# Patient Record
Sex: Male | Born: 1965 | Race: Black or African American | Hispanic: No | Marital: Single | Smoking: Current every day smoker
Health system: Southern US, Community
[De-identification: ages and names within clinical notes are randomized; demographics above are authoritative.]

## PROBLEM LIST (undated history)

## (undated) DIAGNOSIS — J45909 Unspecified asthma, uncomplicated: Secondary | ICD-10-CM

## (undated) DIAGNOSIS — N400 Enlarged prostate without lower urinary tract symptoms: Secondary | ICD-10-CM

## (undated) DIAGNOSIS — Z21 Asymptomatic human immunodeficiency virus [HIV] infection status: Secondary | ICD-10-CM

## (undated) DIAGNOSIS — I10 Essential (primary) hypertension: Secondary | ICD-10-CM

## (undated) DIAGNOSIS — S42309A Unspecified fracture of shaft of humerus, unspecified arm, initial encounter for closed fracture: Secondary | ICD-10-CM

## (undated) DIAGNOSIS — B20 Human immunodeficiency virus [HIV] disease: Secondary | ICD-10-CM

## (undated) DIAGNOSIS — F209 Schizophrenia, unspecified: Secondary | ICD-10-CM

## (undated) DIAGNOSIS — L309 Dermatitis, unspecified: Secondary | ICD-10-CM

## (undated) DIAGNOSIS — G4730 Sleep apnea, unspecified: Secondary | ICD-10-CM

## (undated) DIAGNOSIS — K4090 Unilateral inguinal hernia, without obstruction or gangrene, not specified as recurrent: Secondary | ICD-10-CM

## (undated) HISTORY — PX: MULTIPLE TOOTH EXTRACTIONS: SHX2053

---

## 2018-08-23 ENCOUNTER — Encounter (HOSPITAL_COMMUNITY): Payer: Self-pay | Admitting: Emergency Medicine

## 2018-08-23 ENCOUNTER — Ambulatory Visit (HOSPITAL_COMMUNITY)
Admission: EM | Admit: 2018-08-23 | Discharge: 2018-08-23 | Disposition: A | Discharge status: Home / Self Care | Payer: Self-pay | Attending: Family Medicine | Admitting: Family Medicine

## 2018-08-23 DIAGNOSIS — Z23 Encounter for immunization: Secondary | ICD-10-CM

## 2018-08-23 DIAGNOSIS — T22252A Burn of second degree of left shoulder, initial encounter: Secondary | ICD-10-CM

## 2018-08-23 DIAGNOSIS — L2084 Intrinsic (allergic) eczema: Secondary | ICD-10-CM

## 2018-08-23 MED ORDER — METHYLPREDNISOLONE ACETATE 80 MG/ML IJ SUSP
80.0000 mg | Freq: Once | INTRAMUSCULAR | Status: AC
  Administered 2018-08-23: 80 mg via INTRAMUSCULAR

## 2018-08-23 MED ORDER — SILVER SULFADIAZINE 1 % EX CREA: 1.0000 "application " | TOPICAL_CREAM | Freq: Every day | CUTANEOUS | 1 refills | Status: DC

## 2018-08-23 MED ORDER — METHYLPREDNISOLONE ACETATE 80 MG/ML IJ SUSP
INTRAMUSCULAR | Status: AC
  Filled 2018-08-23: qty 1

## 2018-08-23 MED ORDER — DIPHENHYDRAMINE HCL 25 MG PO CAPS
ORAL_CAPSULE | ORAL | Status: AC
  Filled 2018-08-23: qty 1

## 2018-08-23 MED ORDER — TETANUS-DIPHTH-ACELL PERTUSSIS 5-2.5-18.5 LF-MCG/0.5 IM SUSP
INTRAMUSCULAR | Status: AC
  Filled 2018-08-23: qty 0.5

## 2018-08-23 MED ORDER — TETANUS-DIPHTH-ACELL PERTUSSIS 5-2.5-18.5 LF-MCG/0.5 IM SUSP
0.5000 mL | Freq: Once | INTRAMUSCULAR | Status: AC
  Administered 2018-08-23: 0.5 mL via INTRAMUSCULAR

## 2018-08-23 MED ORDER — DIPHENHYDRAMINE HCL 25 MG PO CAPS
25.0000 mg | ORAL_CAPSULE | Freq: Once | ORAL | Status: AC
  Administered 2018-08-23: 25 mg via ORAL
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
## 2018-08-23 NOTE — Discharge Instructions (Addendum)
Apply the burn cream after gently washing the wound with soap and water daily.  This wound will take 2 to 3 weeks to heal.

## 2019-02-27 ENCOUNTER — Emergency Department (HOSPITAL_COMMUNITY): Payer: Self-pay

## 2019-02-27 ENCOUNTER — Encounter (HOSPITAL_COMMUNITY): Payer: Self-pay

## 2019-02-27 ENCOUNTER — Other Ambulatory Visit: Payer: Self-pay

## 2019-02-27 ENCOUNTER — Emergency Department (HOSPITAL_COMMUNITY)
Admission: EM | Admit: 2019-02-27 | Discharge: 2019-02-27 | Disposition: A | Discharge status: Home / Self Care | Payer: Self-pay | Attending: Emergency Medicine | Admitting: Emergency Medicine

## 2019-02-27 DIAGNOSIS — Y9389 Activity, other specified: Secondary | ICD-10-CM | POA: Insufficient documentation

## 2019-02-27 DIAGNOSIS — Z21 Asymptomatic human immunodeficiency virus [HIV] infection status: Secondary | ICD-10-CM | POA: Insufficient documentation

## 2019-02-27 DIAGNOSIS — Y92481 Parking lot as the place of occurrence of the external cause: Secondary | ICD-10-CM | POA: Insufficient documentation

## 2019-02-27 DIAGNOSIS — W3400XA Accidental discharge from unspecified firearms or gun, initial encounter: Secondary | ICD-10-CM

## 2019-02-27 DIAGNOSIS — S41132A Puncture wound without foreign body of left upper arm, initial encounter: Secondary | ICD-10-CM

## 2019-02-27 DIAGNOSIS — S42352B Displaced comminuted fracture of shaft of humerus, left arm, initial encounter for open fracture: Secondary | ICD-10-CM | POA: Insufficient documentation

## 2019-02-27 DIAGNOSIS — Y998 Other external cause status: Secondary | ICD-10-CM | POA: Insufficient documentation

## 2019-02-27 HISTORY — DX: Human immunodeficiency virus (HIV) disease: B20

## 2019-02-27 HISTORY — DX: Asymptomatic human immunodeficiency virus (hiv) infection status: Z21

## 2019-02-27 LAB — CBC
HCT: 46 % (ref 39.0–52.0)
Hemoglobin: 15.3 g/dL (ref 13.0–17.0)
MCH: 30.1 pg (ref 26.0–34.0)
MCHC: 33.3 g/dL (ref 30.0–36.0)
MCV: 90.4 fL (ref 80.0–100.0)
Platelets: 183 10*3/uL (ref 150–400)
RBC: 5.09 MIL/uL (ref 4.22–5.81)
RDW: 13.2 % (ref 11.5–15.5)
WBC: 8.4 10*3/uL (ref 4.0–10.5)
nRBC: 0 % (ref 0.0–0.2)

## 2019-02-27 LAB — COMPREHENSIVE METABOLIC PANEL
ALT: 23 U/L (ref 0–44)
AST: 44 U/L — ABNORMAL HIGH (ref 15–41)
Albumin: 3.6 g/dL (ref 3.5–5.0)
Alkaline Phosphatase: 54 U/L (ref 38–126)
Anion gap: 11 (ref 5–15)
BUN: 8 mg/dL (ref 6–20)
CO2: 23 mmol/L (ref 22–32)
Calcium: 9.3 mg/dL (ref 8.9–10.3)
Chloride: 103 mmol/L (ref 98–111)
Creatinine, Ser: 1.16 mg/dL (ref 0.61–1.24)
GFR calc Af Amer: >60 mL/min (ref >60)
GFR calc non Af Amer: >60 mL/min (ref >60)
Glucose, Bld: 123 mg/dL — ABNORMAL HIGH (ref 70–99)
Potassium: 3.5 mmol/L (ref 3.5–5.1)
Sodium: 137 mmol/L (ref 135–145)
Total Bilirubin: 1.3 mg/dL — ABNORMAL HIGH (ref 0.3–1.2)
Total Protein: 7.9 g/dL (ref 6.5–8.1)

## 2019-02-27 LAB — SAMPLE TO BLOOD BANK

## 2019-02-27 LAB — ETHANOL: Alcohol, Ethyl (B): <10 mg/dL (ref <10)

## 2019-02-27 LAB — PROTIME-INR
INR: 1.1 (ref 0.8–1.2)
Prothrombin Time: 13.6 seconds (ref 11.4–15.2)

## 2019-02-27 LAB — LACTIC ACID, PLASMA: Lactic Acid, Venous: 3.5 mmol/L (ref 0.5–1.9)

## 2019-02-27 LAB — CDS SEROLOGY

## 2019-02-27 MED ORDER — OXYCODONE-ACETAMINOPHEN 5-325 MG PO TABS: 1.0000 | ORAL_TABLET | ORAL | 0 refills | Status: AC | PRN

## 2019-02-27 MED ORDER — CEPHALEXIN 500 MG PO CAPS: 500.0000 mg | ORAL_CAPSULE | Freq: Four times a day (QID) | ORAL | 0 refills | Status: AC

## 2019-02-27 MED ORDER — OXYCODONE-ACETAMINOPHEN 5-325 MG PO TABS
2.0000 | ORAL_TABLET | Freq: Once | ORAL | Status: AC
  Administered 2019-02-27: 2 via ORAL
  Filled 2019-02-27: qty 2

## 2019-02-27 MED ORDER — MORPHINE SULFATE (PF) 4 MG/ML IV SOLN
4.0000 mg | Freq: Once | INTRAVENOUS | Status: AC
  Administered 2019-02-27: 4 mg via INTRAVENOUS
  Filled 2019-02-27: qty 1

## 2019-02-27 MED ORDER — CEFAZOLIN SODIUM-DEXTROSE 2-4 GM/100ML-% IV SOLN
2.0000 g | Freq: Once | INTRAVENOUS | Status: AC
  Administered 2019-02-27: 20:00:00 2 g via INTRAVENOUS
  Filled 2019-02-27: qty 100

## 2019-02-27 MED ORDER — FENTANYL CITRATE (PF) 100 MCG/2ML IJ SOLN
INTRAMUSCULAR | Status: AC | PRN
  Administered 2019-02-27: 100 ug via INTRAVENOUS

## 2019-02-27 MED ORDER — FENTANYL CITRATE (PF) 100 MCG/2ML IJ SOLN
100.0000 ug | Freq: Once | INTRAMUSCULAR | Status: AC
  Administered 2019-02-27: 100 ug via INTRAVENOUS
  Filled 2019-02-27: qty 2

## 2019-02-27 NOTE — ED Notes (Signed)
Marcus Nichols [phone]  Came to visit. Would like to call the pt later.
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
## 2019-02-28 ENCOUNTER — Encounter (HOSPITAL_COMMUNITY): Payer: Self-pay | Admitting: *Deleted

## 2019-02-28 ENCOUNTER — Ambulatory Visit: Payer: Self-pay | Admitting: Student

## 2019-02-28 ENCOUNTER — Encounter (HOSPITAL_COMMUNITY): Payer: Self-pay | Admitting: Emergency Medicine

## 2019-02-28 DIAGNOSIS — S42352B Displaced comminuted fracture of shaft of humerus, left arm, initial encounter for open fracture: Secondary | ICD-10-CM | POA: Insufficient documentation

## 2019-02-28 NOTE — Progress Notes (Signed)
Unable to reach patient after several attempts to discuss procedure details and instruct patient to arrive at [facility] 2 hours prior to scheduled surgery tomorrow (03/01/2019). Unable to leave voicemail for patient due to mailbox being full. Several unsuccessful attempts to reach brother, Mellody Dance, as well. Will make one additional attempt this evening to both the patient and his brother.   Carsin Randazzo A. Ladonna Snide [facility] ?[phone]? (phone)

## 2019-02-28 NOTE — Progress Notes (Signed)
Unable to reach patient after several attempts. LMOM with pre-op instructions for DOS. Informed to stop all vitamin, supplements, Ibuprofen/NSAIDS, Goody's, BC Powder.  May take percocet if needed.  Informed of hospital visitor restriction policy that is now in place.  Will need to complete all medical/surgical history on DOS.  Patient informed if he has any symptoms of covid-10 - fever, cough, SOB/difficulty breathing, muscle aches, n/v to call [phone] prior to arrival to hospital on DOS.

## 2019-03-01 ENCOUNTER — Encounter (HOSPITAL_COMMUNITY): Payer: Self-pay | Admitting: Anesthesiology

## 2019-03-01 ENCOUNTER — Ambulatory Visit (HOSPITAL_COMMUNITY): Admission: RE | Admit: 2019-03-01 | Payer: Self-pay | Source: Ambulatory Visit | Admitting: Student

## 2019-03-01 HISTORY — DX: Dermatitis, unspecified: L30.9

## 2019-03-01 SURGERY — OPEN REDUCTION INTERNAL FIXATION (ORIF) HUMERAL SHAFT FRACTURE
Anesthesia: General | Laterality: Left
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
## 2019-03-06 ENCOUNTER — Other Ambulatory Visit: Payer: Self-pay

## 2019-03-06 ENCOUNTER — Encounter (HOSPITAL_COMMUNITY): Payer: Self-pay | Admitting: *Deleted
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
## 2019-03-07 ENCOUNTER — Encounter (HOSPITAL_COMMUNITY)
Admission: RE | Disposition: A | Discharge status: Home / Self Care | Payer: Self-pay | Source: Home / Self Care | Attending: Student

## 2019-03-07 ENCOUNTER — Ambulatory Visit (HOSPITAL_COMMUNITY): Payer: Self-pay | Admitting: Anesthesiology

## 2019-03-07 ENCOUNTER — Ambulatory Visit (HOSPITAL_COMMUNITY)
Admission: RE | Admit: 2019-03-07 | Discharge: 2019-03-07 | Disposition: A | Discharge status: Home / Self Care | Payer: Self-pay | Attending: Student | Admitting: Student

## 2019-03-07 ENCOUNTER — Encounter (HOSPITAL_COMMUNITY): Payer: Self-pay

## 2019-03-07 ENCOUNTER — Ambulatory Visit (HOSPITAL_COMMUNITY): Payer: Self-pay

## 2019-03-07 DIAGNOSIS — F17200 Nicotine dependence, unspecified, uncomplicated: Secondary | ICD-10-CM | POA: Insufficient documentation

## 2019-03-07 DIAGNOSIS — Z21 Asymptomatic human immunodeficiency virus [HIV] infection status: Secondary | ICD-10-CM | POA: Insufficient documentation

## 2019-03-07 DIAGNOSIS — L309 Dermatitis, unspecified: Secondary | ICD-10-CM | POA: Insufficient documentation

## 2019-03-07 DIAGNOSIS — Z419 Encounter for procedure for purposes other than remedying health state, unspecified: Secondary | ICD-10-CM

## 2019-03-07 DIAGNOSIS — G4730 Sleep apnea, unspecified: Secondary | ICD-10-CM | POA: Insufficient documentation

## 2019-03-07 DIAGNOSIS — W458XXA Other foreign body or object entering through skin, initial encounter: Secondary | ICD-10-CM | POA: Insufficient documentation

## 2019-03-07 DIAGNOSIS — S42352A Displaced comminuted fracture of shaft of humerus, left arm, initial encounter for closed fracture: Secondary | ICD-10-CM | POA: Insufficient documentation

## 2019-03-07 DIAGNOSIS — W3400XA Accidental discharge from unspecified firearms or gun, initial encounter: Secondary | ICD-10-CM | POA: Insufficient documentation

## 2019-03-07 HISTORY — DX: Unspecified asthma, uncomplicated: J45.909

## 2019-03-07 HISTORY — PX: ORIF HUMERUS FRACTURE: SHX2126

## 2019-03-07 HISTORY — DX: Unspecified fracture of shaft of humerus, unspecified arm, initial encounter for closed fracture: S42.309A

## 2019-03-07 HISTORY — DX: Benign prostatic hyperplasia without lower urinary tract symptoms: N40.0

## 2019-03-07 HISTORY — DX: Sleep apnea, unspecified: G47.30

## 2019-03-07 HISTORY — DX: Unilateral inguinal hernia, without obstruction or gangrene, not specified as recurrent: K40.90

## 2019-03-07 HISTORY — DX: Schizophrenia, unspecified: F20.9

## 2019-03-07 SURGERY — OPEN REDUCTION INTERNAL FIXATION (ORIF) HUMERAL SHAFT FRACTURE
Anesthesia: General | Laterality: Left

## 2019-03-07 MED ORDER — OXYCODONE-ACETAMINOPHEN 5-325 MG PO TABS: 1.0000 | ORAL_TABLET | ORAL | 0 refills | Status: DC | PRN

## 2019-03-07 MED ORDER — BUPIVACAINE-EPINEPHRINE (PF) 0.25% -1:200000 IJ SOLN
INTRAMUSCULAR | Status: AC
  Filled 2019-03-07: qty 30

## 2019-03-07 MED ORDER — PHENYLEPHRINE 40 MCG/ML (10ML) SYRINGE FOR IV PUSH (FOR BLOOD PRESSURE SUPPORT)
PREFILLED_SYRINGE | INTRAVENOUS | Status: DC | PRN
  Administered 2019-03-07: 40 ug via INTRAVENOUS

## 2019-03-07 MED ORDER — LIDOCAINE-EPINEPHRINE (PF) 1.5 %-1:200000 IJ SOLN
INTRAMUSCULAR | Status: DC | PRN
  Administered 2019-03-07: 10 mL via PERINEURAL

## 2019-03-07 MED ORDER — ONDANSETRON HCL 4 MG/2ML IJ SOLN
INTRAMUSCULAR | Status: AC
  Filled 2019-03-07: qty 2

## 2019-03-07 MED ORDER — FENTANYL CITRATE (PF) 100 MCG/2ML IJ SOLN
INTRAMUSCULAR | Status: AC
  Administered 2019-03-07: 100 ug via INTRAVENOUS
  Filled 2019-03-07: qty 2

## 2019-03-07 MED ORDER — OXYCODONE HCL 5 MG PO TABS
ORAL_TABLET | ORAL | Status: AC
  Filled 2019-03-07: qty 1

## 2019-03-07 MED ORDER — VANCOMYCIN HCL 1000 MG IV SOLR
INTRAVENOUS | Status: DC | PRN
  Administered 2019-03-07: 1000 mg

## 2019-03-07 MED ORDER — FENTANYL CITRATE (PF) 100 MCG/2ML IJ SOLN
100.0000 ug | Freq: Once | INTRAMUSCULAR | Status: AC
  Administered 2019-03-07: 100 ug via INTRAVENOUS

## 2019-03-07 MED ORDER — HYDROMORPHONE HCL 1 MG/ML IJ SOLN: 0.2500 mg | INTRAMUSCULAR | Status: DC | PRN

## 2019-03-07 MED ORDER — PROPOFOL 10 MG/ML IV BOLUS
INTRAVENOUS | Status: DC | PRN
  Administered 2019-03-07: 200 mg via INTRAVENOUS
  Administered 2019-03-07: 30 mg via INTRAVENOUS

## 2019-03-07 MED ORDER — MIDAZOLAM HCL 2 MG/2ML IJ SOLN
2.0000 mg | Freq: Once | INTRAMUSCULAR | Status: AC
  Administered 2019-03-07: 2 mg via INTRAVENOUS

## 2019-03-07 MED ORDER — LACTATED RINGERS IV SOLN
INTRAVENOUS | Status: DC
  Administered 2019-03-07 (×2): via INTRAVENOUS

## 2019-03-07 MED ORDER — PROMETHAZINE HCL 25 MG/ML IJ SOLN: 6.2500 mg | INTRAMUSCULAR | Status: DC | PRN

## 2019-03-07 MED ORDER — OXYCODONE HCL 5 MG PO TABS
5.0000 mg | ORAL_TABLET | Freq: Once | ORAL | Status: AC | PRN
  Administered 2019-03-07: 5 mg via ORAL

## 2019-03-07 MED ORDER — CEFAZOLIN SODIUM-DEXTROSE 2-4 GM/100ML-% IV SOLN
2.0000 g | INTRAVENOUS | Status: AC
  Administered 2019-03-07: 2 g via INTRAVENOUS
  Filled 2019-03-07: qty 100

## 2019-03-07 MED ORDER — MIDAZOLAM HCL 2 MG/2ML IJ SOLN
INTRAMUSCULAR | Status: AC
  Filled 2019-03-07: qty 2

## 2019-03-07 MED ORDER — MIDAZOLAM HCL 2 MG/2ML IJ SOLN
INTRAMUSCULAR | Status: AC
  Administered 2019-03-07: 2 mg via INTRAVENOUS
  Filled 2019-03-07: qty 2

## 2019-03-07 MED ORDER — 0.9 % SODIUM CHLORIDE (POUR BTL) OPTIME
TOPICAL | Status: DC | PRN
  Administered 2019-03-07: 11:00:00 1000 mL

## 2019-03-07 MED ORDER — DEXAMETHASONE SODIUM PHOSPHATE 10 MG/ML IJ SOLN
INTRAMUSCULAR | Status: DC | PRN
  Administered 2019-03-07: 10 mg via INTRAVENOUS

## 2019-03-07 MED ORDER — DEXAMETHASONE SODIUM PHOSPHATE 10 MG/ML IJ SOLN
INTRAMUSCULAR | Status: AC
  Filled 2019-03-07: qty 1

## 2019-03-07 MED ORDER — PROPOFOL 10 MG/ML IV BOLUS
INTRAVENOUS | Status: AC
  Filled 2019-03-07: qty 20

## 2019-03-07 MED ORDER — BUPIVACAINE HCL (PF) 0.5 % IJ SOLN
INTRAMUSCULAR | Status: DC | PRN
  Administered 2019-03-07: 25 mL via PERINEURAL

## 2019-03-07 MED ORDER — ONDANSETRON HCL 4 MG/2ML IJ SOLN
INTRAMUSCULAR | Status: DC | PRN
  Administered 2019-03-07: 4 mg via INTRAVENOUS

## 2019-03-07 MED ORDER — FENTANYL CITRATE (PF) 250 MCG/5ML IJ SOLN
INTRAMUSCULAR | Status: AC
  Filled 2019-03-07: qty 5

## 2019-03-07 MED ORDER — DEXMEDETOMIDINE HCL 200 MCG/2ML IV SOLN
INTRAVENOUS | Status: DC | PRN
  Administered 2019-03-07: 4 ug via INTRAVENOUS
  Administered 2019-03-07: 8 ug via INTRAVENOUS
  Administered 2019-03-07: 4 ug via INTRAVENOUS
  Administered 2019-03-07: 8 ug via INTRAVENOUS
  Administered 2019-03-07: 4 ug via INTRAVENOUS

## 2019-03-07 MED ORDER — OXYCODONE HCL 5 MG/5ML PO SOLN: 5.0000 mg | Freq: Once | ORAL | Status: AC | PRN

## 2019-03-07 MED ORDER — FENTANYL CITRATE (PF) 250 MCG/5ML IJ SOLN
INTRAMUSCULAR | Status: DC | PRN
  Administered 2019-03-07 (×3): 50 ug via INTRAVENOUS

## 2019-03-07 MED ORDER — LIDOCAINE 2% (20 MG/ML) 5 ML SYRINGE
INTRAMUSCULAR | Status: DC | PRN
  Administered 2019-03-07: 100 mg via INTRAVENOUS

## 2019-03-07 MED ORDER — SUCCINYLCHOLINE CHLORIDE 200 MG/10ML IV SOSY
PREFILLED_SYRINGE | INTRAVENOUS | Status: DC | PRN
  Administered 2019-03-07: 120 mg via INTRAVENOUS

## 2019-03-07 MED ORDER — SUGAMMADEX SODIUM 200 MG/2ML IV SOLN
INTRAVENOUS | Status: DC | PRN
  Administered 2019-03-07: 250 mg via INTRAVENOUS

## 2019-03-07 MED ORDER — LIDOCAINE 2% (20 MG/ML) 5 ML SYRINGE
INTRAMUSCULAR | Status: AC
  Filled 2019-03-07: qty 5

## 2019-03-07 MED ORDER — VANCOMYCIN HCL 1000 MG IV SOLR
INTRAVENOUS | Status: AC
  Filled 2019-03-07: qty 1000

## 2019-03-07 MED ORDER — ROCURONIUM BROMIDE 10 MG/ML (PF) SYRINGE
PREFILLED_SYRINGE | INTRAVENOUS | Status: DC | PRN
  Administered 2019-03-07: 20 mg via INTRAVENOUS
  Administered 2019-03-07: 50 mg via INTRAVENOUS

## 2019-03-07 SURGICAL SUPPLY — 62 items
BANDAGE ACE 6X5 VEL STRL LF (GAUZE/BANDAGES/DRESSINGS) ×2 IMPLANT
BANDAGE ELASTIC 4 VELCRO ST LF (GAUZE/BANDAGES/DRESSINGS) ×2 IMPLANT
BANDAGE ELASTIC 6 VELCRO ST LF (GAUZE/BANDAGES/DRESSINGS) ×2 IMPLANT
BIT DRILL Q/COUPLING 1 (BIT) ×2 IMPLANT
BNDG COHESIVE 4X5 TAN STRL (GAUZE/BANDAGES/DRESSINGS) ×2 IMPLANT
BRUSH SCRUB SURG 4.25 DISP (MISCELLANEOUS) ×4 IMPLANT
CHLORAPREP W/TINT 26ML (MISCELLANEOUS) ×2 IMPLANT
COVER SURGICAL LIGHT HANDLE (MISCELLANEOUS) ×4 IMPLANT
COVER WAND RF STERILE (DRAPES) ×2 IMPLANT
DERMABOND ADVANCED (GAUZE/BANDAGES/DRESSINGS) ×2
DERMABOND ADVANCED .7 DNX12 (GAUZE/BANDAGES/DRESSINGS) ×2 IMPLANT
DRAPE C-ARM 42X72 X-RAY (DRAPES) ×2 IMPLANT
DRAPE INCISE IOBAN 66X45 STRL (DRAPES) ×2 IMPLANT
DRAPE ORTHO SPLIT 77X108 STRL (DRAPES) ×2
DRAPE SURG 17X23 STRL (DRAPES) ×2 IMPLANT
DRAPE SURG ORHT 6 SPLT 77X108 (DRAPES) ×2 IMPLANT
DRAPE U-SHAPE 47X51 STRL (DRAPES) ×4 IMPLANT
DRSG MEPILEX BORDER 4X12 (GAUZE/BANDAGES/DRESSINGS) ×2 IMPLANT
DRSG MEPILEX BORDER 4X8 (GAUZE/BANDAGES/DRESSINGS) ×2 IMPLANT
DRSG PAD ABDOMINAL 8X10 ST (GAUZE/BANDAGES/DRESSINGS) ×2 IMPLANT
ELECT REM PT RETURN 9FT ADLT (ELECTROSURGICAL) ×2
ELECTRODE REM PT RTRN 9FT ADLT (ELECTROSURGICAL) ×1 IMPLANT
EVACUATOR 1/8 PVC DRAIN (DRAIN) IMPLANT
GLOVE BIO SURGEON STRL SZ 6.5 (GLOVE) ×6 IMPLANT
GLOVE BIO SURGEON STRL SZ7.5 (GLOVE) ×8 IMPLANT
GLOVE BIOGEL PI IND STRL 6.5 (GLOVE) ×1 IMPLANT
GLOVE BIOGEL PI IND STRL 7.5 (GLOVE) ×1 IMPLANT
GLOVE BIOGEL PI INDICATOR 6.5 (GLOVE) ×1
GLOVE BIOGEL PI INDICATOR 7.5 (GLOVE) ×1
GOWN STRL REUS W/ TWL LRG LVL3 (GOWN DISPOSABLE) ×2 IMPLANT
GOWN STRL REUS W/TWL LRG LVL3 (GOWN DISPOSABLE) ×2
KIT BASIN OR (CUSTOM PROCEDURE TRAY) ×2 IMPLANT
KIT TURNOVER KIT B (KITS) ×2 IMPLANT
MANIFOLD NEPTUNE II (INSTRUMENTS) ×2 IMPLANT
NEEDLE HYPO 25X1 1.5 SAFETY (NEEDLE) ×2 IMPLANT
NS IRRIG 1000ML POUR BTL (IV SOLUTION) ×2 IMPLANT
PACK ORTHO EXTREMITY (CUSTOM PROCEDURE TRAY) ×2 IMPLANT
PAD ARMBOARD 7.5X6 YLW CONV (MISCELLANEOUS) ×4 IMPLANT
PAD CAST 4YDX4 CTTN HI CHSV (CAST SUPPLIES) ×1 IMPLANT
PADDING CAST COTTON 4X4 STRL (CAST SUPPLIES) ×1
PLATE LOCKING 11 HOLE (Plate) ×2 IMPLANT
SCREW CORTEX ST 4.5X28 (Screw) ×2 IMPLANT
SCREW CORTEX ST 4.5X30 (Screw) ×2 IMPLANT
SCREW CORTEX ST 4.5X32 (Screw) ×12 IMPLANT
SPONGE LAP 18X18 RF (DISPOSABLE) ×2 IMPLANT
STAPLER VISISTAT 35W (STAPLE) ×2 IMPLANT
SUCTION FRAZIER HANDLE 10FR (MISCELLANEOUS) ×1
SUCTION TUBE FRAZIER 10FR DISP (MISCELLANEOUS) ×1 IMPLANT
SUT ETHILON 3 0 PS 1 (SUTURE) ×4 IMPLANT
SUT MNCRL AB 3-0 PS2 18 (SUTURE) ×2 IMPLANT
SUT PROLENE 0 CT (SUTURE) IMPLANT
SUT VIC AB 0 CT1 27 (SUTURE) ×2
SUT VIC AB 0 CT1 27XBRD ANBCTR (SUTURE) ×2 IMPLANT
SUT VIC AB 2-0 CT1 27 (SUTURE) ×2
SUT VIC AB 2-0 CT1 TAPERPNT 27 (SUTURE) ×2 IMPLANT
SYR CONTROL 10ML LL (SYRINGE) ×2 IMPLANT
TOWEL OR 17X24 6PK STRL BLUE (TOWEL DISPOSABLE) ×2 IMPLANT
TOWEL OR 17X26 10 PK STRL BLUE (TOWEL DISPOSABLE) ×6 IMPLANT
TRAY FOLEY MTR SLVR 16FR STAT (SET/KITS/TRAYS/PACK) IMPLANT
TUBE CONNECTING 12X1/4 (SUCTIONS) ×2 IMPLANT
WATER STERILE IRR 1000ML POUR (IV SOLUTION) ×2 IMPLANT
YANKAUER SUCT BULB TIP NO VENT (SUCTIONS) ×4 IMPLANT

## 2019-03-07 NOTE — Interval H&P Note (Signed)
History and Physical Interval Note:  03/07/2019 10:30 AM  Marcus Nichols  has presented today for surgery, with the diagnosis of LEFT HUMERUS FRACTURE.  The various methods of treatment have been discussed with the patient and family. After consideration of risks, benefits and other options for treatment, the patient has consented to  Procedure(s): OPEN REDUCTION INTERNAL FIXATION (ORIF) HUMERAL SHAFT FRACTURE (Left) as a surgical intervention.  The patient's history has been reviewed, patient examined, no change in status, stable for surgery.  I have reviewed the patient's chart and labs.  Questions were answered to the patient's satisfaction.     Caryn Bee P Haddix

## 2019-03-07 NOTE — Anesthesia Procedure Notes (Signed)
Procedure Name: Intubation Date/Time: 03/07/2019 10:54 AM Performed by: Alvera Novel, CRNA Pre-anesthesia Checklist: Patient identified, Emergency Drugs available, Suction available and Patient being monitored Patient Re-evaluated:Patient Re-evaluated prior to induction Oxygen Delivery Method: Circle System Utilized Preoxygenation: Pre-oxygenation with 100% oxygen Induction Type: IV induction Ventilation: Mask ventilation without difficulty Laryngoscope Size: 4 and Glidescope Grade View: Grade I Tube type: Oral Tube size: 7.0 mm Number of attempts: 1 Airway Equipment and Method: Stylet and Oral airway Placement Confirmation: ETT inserted through vocal cords under direct vision,  positive ETCO2 and breath sounds checked- equal and bilateral Secured at: 21 cm Tube secured with: Tape Dental Injury: Teeth and Oropharynx as per pre-operative assessment  Comments: Elective glidescope intubation.

## 2019-03-07 NOTE — Anesthesia Postprocedure Evaluation (Signed)
Anesthesia Post Note  Patient: Jathniel Polinsky  Procedure(s) Performed: OPEN REDUCTION INTERNAL FIXATION (ORIF) HUMERAL SHAFT FRACTURE (Left )     Patient location during evaluation: PACU Anesthesia Type: General Level of consciousness: awake and alert Pain management: pain level controlled Vital Signs Assessment: post-procedure vital signs reviewed and stable Respiratory status: spontaneous breathing, nonlabored ventilation, respiratory function stable and patient connected to nasal cannula oxygen Cardiovascular status: blood pressure returned to baseline and stable Postop Assessment: no apparent nausea or vomiting Anesthetic complications: no    Last Vitals:  Vitals:   03/07/19 1335 03/07/19 1349  BP: (!) 159/93 (!) 144/87  Pulse: 81 75  Resp: (!) 25 (!) 24  Temp:    SpO2: 98% 97%    Last Pain:  Vitals:   03/07/19 1344  TempSrc:   PainSc: Asleep                 Cristle Jared S
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
## 2019-03-07 NOTE — Anesthesia Preprocedure Evaluation (Signed)
Anesthesia Evaluation  Patient identified by MRN, date of birth, ID band Patient awake    Reviewed: Allergy & Precautions, NPO status , Patient's Chart, lab work & pertinent test results  Airway Mallampati: II  TM Distance: >3 FB Neck ROM: Full    Dental no notable dental hx.    Pulmonary sleep apnea , Current Smoker,    Pulmonary exam normal breath sounds clear to auscultation       Cardiovascular Normal cardiovascular exam Rhythm:Regular Rate:Normal     Neuro/Psych Schizophrenia negative neurological ROS     GI/Hepatic negative GI ROS, Neg liver ROS,   Endo/Other  negative endocrine ROS  Renal/GU negative Renal ROS  negative genitourinary   Musculoskeletal negative musculoskeletal ROS (+)   Abdominal   Peds negative pediatric ROS (+)  Hematology  (+) HIV,   Anesthesia Other Findings   Reproductive/Obstetrics negative OB ROS                             Anesthesia Physical Anesthesia Plan  ASA: III  Anesthesia Plan: General   Post-op Pain Management:  Regional for Post-op pain   Induction: Intravenous  PONV Risk Score and Plan: 2 and Ondansetron, Dexamethasone and Treatment may vary due to age or medical condition  Airway Management Planned: Oral ETT and Video Laryngoscope Planned  Additional Equipment:   Intra-op Plan:   Post-operative Plan: Extubation in OR  Informed Consent: I have reviewed the patients History and Physical, chart, labs and discussed the procedure including the risks, benefits and alternatives for the proposed anesthesia with the patient or authorized representative who has indicated his/her understanding and acceptance.     Dental advisory given  Plan Discussed with: CRNA and Surgeon  Anesthesia Plan Comments:         Anesthesia Quick Evaluation

## 2019-03-07 NOTE — Anesthesia Procedure Notes (Signed)
Anesthesia Procedure Image    

## 2019-03-07 NOTE — Progress Notes (Signed)
This RN contacted patient's wife to update her on the patient's condition. Wife stated she will be able to pick patient up at 1530. Pt became upset stating that he would like to leave against medical advice. Dr. Okey Dupre anesthesiologist paged to speak with patient and came to bedside at approx 1410. Pt verbalized agreement to stay in PACU and stated that he would follow the rules after speaking with Dr. Okey Dupre. Pt given sprite to drink while waiting for wife's arrival.
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
## 2019-03-07 NOTE — Transfer of Care (Signed)
Immediate Anesthesia Transfer of Care Note  Patient: Marcus Nichols  Procedure(s) Performed: OPEN REDUCTION INTERNAL FIXATION (ORIF) HUMERAL SHAFT FRACTURE (Left )  Patient Location: PACU  Anesthesia Type:General  Level of Consciousness: awake and alert   Airway & Oxygen Therapy: Patient Spontanous Breathing and Patient connected to face mask oxygen  Post-op Assessment: Report given to RN and Post -op Vital signs reviewed and stable  Post vital signs: Reviewed and stable  Last Vitals:  Vitals Value Taken Time  BP 177/86 03/07/2019  1:19 PM  Temp    Pulse 93 03/07/2019  1:21 PM  Resp 34 03/07/2019  1:21 PM  SpO2 100 % 03/07/2019  1:21 PM  Vitals shown include unvalidated device data.  Last Pain:  Vitals:   03/07/19 0849  TempSrc:   PainSc: 6       Patients Stated Pain Goal: 3 (03/07/19 0849)  Complications: No apparent anesthesia complications
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
## 2019-03-07 NOTE — Discharge Instructions (Addendum)
[facility] Discharge Instructions   General Discharge Instructions  WEIGHT BEARING STATUS: Non weightbearing left arm  RANGE OF MOTION/ACTIVITY: Okay to come out of sling for gentle range of motion of left elbow and shoulder. Sling should be worn at all other times including sleeping.   Wound Care: leave dressing in place until POD #2 (03/09/19). Dressing can be removed at that time and if no drainage can be left open to air. Able to shower start on POD #3 (03/10/19) if incision is dry.  DVT/PE prophylaxis: None needed  Diet: as you were eating previously.  Can use over the counter stool softeners and bowel preparations, such as Miralax, to help with bowel movements.  Narcotics can be constipating.  Be sure to drink plenty of fluids  PAIN MEDICATION USE AND EXPECTATIONS  You have likely been given narcotic medications to help control your pain.  After a traumatic event that results in an fracture (broken bone) with or without surgery, it is ok to use narcotic pain medications to help control one's pain.  We understand that everyone responds to pain differently and each individual patient will be evaluated on a regular basis for the continued need for narcotic medications. Ideally, narcotic medication use should last no more than 6-8 weeks (coinciding with fracture healing).   As a patient it is your responsibility as well to monitor narcotic medication use and report the amount and frequency you use these medications when you come to your office visit.   We would also advise that if you are using narcotic medications, you should take a dose prior to therapy to maximize you participation.  IF YOU ARE ON NARCOTIC MEDICATIONS IT IS NOT PERMISSIBLE TO OPERATE A MOTOR VEHICLE (MOTORCYCLE/CAR/TRUCK/MOPED) OR HEAVY MACHINERY DO NOT MIX NARCOTICS WITH OTHER CNS (CENTRAL NERVOUS SYSTEM) DEPRESSANTS SUCH AS ALCOHOL   STOP SMOKING OR USING NICOTINE PRODUCTS!!!!  As discussed nicotine  severely impairs your body's ability to heal surgical and traumatic wounds but also impairs bone healing.  Wounds and bone heal by forming microscopic blood vessels (angiogenesis) and nicotine is a vasoconstrictor (essentially, shrinks blood vessels).  Therefore, if vasoconstriction occurs to these microscopic blood vessels they essentially disappear and are unable to deliver necessary nutrients to the healing tissue.  This is one modifiable factor that you can do to dramatically increase your chances of healing your injury.    (This means no smoking, no nicotine gum, patches, etc)  DO NOT USE NONSTEROIDAL ANTI-INFLAMMATORY DRUGS (NSAID'S)  Using products such as Advil (ibuprofen), Aleve (naproxen), Motrin (ibuprofen) for additional pain control during fracture healing can delay and/or prevent the healing response.  If you would like to take over the counter (OTC) medication, Tylenol (acetaminophen) is ok.  However, some narcotic medications that are given for pain control contain acetaminophen as well. Therefore, you should not exceed more than 4000 mg of tylenol in a day if you do not have liver disease.  Also note that there are may OTC medicines, such as cold medicines and allergy medicines that my contain tylenol as well.  If you have any questions about medications and/or interactions please ask your doctor/PA or your pharmacist.      ICE AND ELEVATE INJURED/OPERATIVE EXTREMITY  Using ice and elevating the injured extremity above your heart can help with swelling and pain control.  Icing in a pulsatile fashion, such as 20 minutes on and 20 minutes off, can be followed.    Do not place ice directly on skin.  Make sure there is a barrier between to skin and the ice pack.    Using frozen items such as frozen peas works well as the conform nicely to the are that needs to be iced.  USE AN ACE WRAP OR TED HOSE FOR SWELLING CONTROL  In addition to icing and elevation, Ace wraps or TED hose are used to  help limit and resolve swelling.  It is recommended to use Ace wraps or TED hose until you are informed to stop.    When using Ace Wraps start the wrapping distally (farthest away from the body) and wrap proximally (closer to the body)   Example: If you had surgery on your leg or thing and you do not have a splint on, start the ace wrap at the toes and work your way up to the thigh        If you had surgery on your upper extremity and do not have a splint on, start the ace wrap at your fingers and work your way up to the upper arm   CALL THE OFFICE WITH ANY QUESTIONS OR CONCERNS: [phone]   VISIT OUR WEBSITE FOR ADDITIONAL INFORMATION: [email or web address]     Discharge Wound Care Instructions  Do NOT apply any ointments, solutions or lotions to pin sites or surgical wounds.  These prevent needed drainage and even though solutions like hydrogen peroxide kill bacteria, they also damage cells lining the pin sites that help fight infection.  Applying lotions or ointments can keep the wounds moist and can cause them to breakdown and open up as well. This can increase the risk for infection. When in doubt call the office.  Surgical incisions should be dressed daily.  If any drainage is noted, use one layer of adaptic, then gauze, Kerlix, and an ace wrap.  Once the incision is completely dry and without drainage, it may be left open to air out.  Showering may begin 36-48 hours later.  Cleaning gently with soap and water.  Traumatic wounds should be dressed daily as well.    One layer of adaptic, gauze, Kerlix, then ace wrap.  The adaptic can be discontinued once the draining has ceased    If you have a wet to dry dressing: wet the gauze with saline the squeeze as much saline out so the gauze is moist (not soaking wet), place moistened gauze over wound, then place a dry gauze over the moist one, followed by Kerlix wrap, then ace wrap.

## 2019-03-08 ENCOUNTER — Encounter (HOSPITAL_COMMUNITY): Payer: Self-pay | Admitting: Student

## 2019-03-19 ENCOUNTER — Emergency Department (HOSPITAL_COMMUNITY): Payer: Self-pay

## 2019-03-19 ENCOUNTER — Other Ambulatory Visit: Payer: Self-pay

## 2019-03-19 ENCOUNTER — Observation Stay (HOSPITAL_COMMUNITY)
Admission: EM | Admit: 2019-03-19 | Discharge: 2019-03-20 | Disposition: A | Discharge status: Home / Self Care | Payer: Self-pay | Attending: Internal Medicine | Admitting: Internal Medicine

## 2019-03-19 DIAGNOSIS — F1910 Other psychoactive substance abuse, uncomplicated: Secondary | ICD-10-CM

## 2019-03-19 DIAGNOSIS — Z8249 Family history of ischemic heart disease and other diseases of the circulatory system: Secondary | ICD-10-CM | POA: Insufficient documentation

## 2019-03-19 DIAGNOSIS — T40601A Poisoning by unspecified narcotics, accidental (unintentional), initial encounter: Secondary | ICD-10-CM | POA: Diagnosis present

## 2019-03-19 DIAGNOSIS — M25512 Pain in left shoulder: Secondary | ICD-10-CM | POA: Insufficient documentation

## 2019-03-19 DIAGNOSIS — J45909 Unspecified asthma, uncomplicated: Secondary | ICD-10-CM | POA: Insufficient documentation

## 2019-03-19 DIAGNOSIS — M79602 Pain in left arm: Secondary | ICD-10-CM | POA: Insufficient documentation

## 2019-03-19 DIAGNOSIS — D72829 Elevated white blood cell count, unspecified: Secondary | ICD-10-CM | POA: Insufficient documentation

## 2019-03-19 DIAGNOSIS — L309 Dermatitis, unspecified: Secondary | ICD-10-CM | POA: Insufficient documentation

## 2019-03-19 DIAGNOSIS — R52 Pain, unspecified: Secondary | ICD-10-CM

## 2019-03-19 DIAGNOSIS — R030 Elevated blood-pressure reading, without diagnosis of hypertension: Secondary | ICD-10-CM | POA: Insufficient documentation

## 2019-03-19 DIAGNOSIS — I10 Essential (primary) hypertension: Secondary | ICD-10-CM | POA: Insufficient documentation

## 2019-03-19 DIAGNOSIS — W19XXXA Unspecified fall, initial encounter: Secondary | ICD-10-CM | POA: Insufficient documentation

## 2019-03-19 DIAGNOSIS — F209 Schizophrenia, unspecified: Secondary | ICD-10-CM | POA: Insufficient documentation

## 2019-03-19 DIAGNOSIS — E876 Hypokalemia: Secondary | ICD-10-CM | POA: Insufficient documentation

## 2019-03-19 DIAGNOSIS — T40604A Poisoning by unspecified narcotics, undetermined, initial encounter: Secondary | ICD-10-CM

## 2019-03-19 DIAGNOSIS — T50904A Poisoning by unspecified drugs, medicaments and biological substances, undetermined, initial encounter: Secondary | ICD-10-CM

## 2019-03-19 DIAGNOSIS — G4733 Obstructive sleep apnea (adult) (pediatric): Secondary | ICD-10-CM | POA: Insufficient documentation

## 2019-03-19 DIAGNOSIS — G9341 Metabolic encephalopathy: Principal | ICD-10-CM | POA: Insufficient documentation

## 2019-03-19 DIAGNOSIS — B20 Human immunodeficiency virus [HIV] disease: Secondary | ICD-10-CM | POA: Insufficient documentation

## 2019-03-19 DIAGNOSIS — F17210 Nicotine dependence, cigarettes, uncomplicated: Secondary | ICD-10-CM | POA: Insufficient documentation

## 2019-03-19 DIAGNOSIS — Z79891 Long term (current) use of opiate analgesic: Secondary | ICD-10-CM | POA: Insufficient documentation

## 2019-03-19 LAB — CBG MONITORING, ED
Glucose-Capillary: 136 mg/dL — ABNORMAL HIGH (ref 70–99)
Glucose-Capillary: 115 mg/dL — ABNORMAL HIGH (ref 70–99)
Glucose-Capillary: 123 mg/dL — ABNORMAL HIGH (ref 70–99)

## 2019-03-19 LAB — CBC WITH DIFFERENTIAL/PLATELET
Abs Immature Granulocytes: 0.08 10*3/uL — ABNORMAL HIGH (ref 0.00–0.07)
Basophils Absolute: 0 10*3/uL (ref 0.0–0.1)
Basophils Relative: 0 %
Eosinophils Absolute: 0.1 10*3/uL (ref 0.0–0.5)
Eosinophils Relative: 1 %
HCT: 43.7 % (ref 39.0–52.0)
Hemoglobin: 14.3 g/dL (ref 13.0–17.0)
Immature Granulocytes: 1 %
Lymphocytes Relative: 19 %
Lymphs Abs: 2.2 10*3/uL (ref 0.7–4.0)
MCH: 30.7 pg (ref 26.0–34.0)
MCHC: 32.7 g/dL (ref 30.0–36.0)
MCV: 93.8 fL (ref 80.0–100.0)
Monocytes Absolute: 0.8 10*3/uL (ref 0.1–1.0)
Monocytes Relative: 7 %
Neutro Abs: 8 10*3/uL — ABNORMAL HIGH (ref 1.7–7.7)
Neutrophils Relative %: 72 %
Platelets: 344 10*3/uL (ref 150–400)
RBC: 4.66 MIL/uL (ref 4.22–5.81)
RDW: 13.4 % (ref 11.5–15.5)
WBC: 11.2 10*3/uL — ABNORMAL HIGH (ref 4.0–10.5)
nRBC: 0 % (ref 0.0–0.2)

## 2019-03-19 LAB — COMPREHENSIVE METABOLIC PANEL
ALT: 30 U/L (ref 0–44)
AST: 40 U/L (ref 15–41)
Albumin: 3.9 g/dL (ref 3.5–5.0)
Alkaline Phosphatase: 92 U/L (ref 38–126)
Anion gap: 10 (ref 5–15)
BUN: 9 mg/dL (ref 6–20)
CO2: 25 mmol/L (ref 22–32)
Calcium: 8.9 mg/dL (ref 8.9–10.3)
Chloride: 104 mmol/L (ref 98–111)
Creatinine, Ser: 1 mg/dL (ref 0.61–1.24)
GFR calc Af Amer: >60 mL/min (ref >60)
GFR calc non Af Amer: >60 mL/min (ref >60)
Glucose, Bld: 144 mg/dL — ABNORMAL HIGH (ref 70–99)
Potassium: 3.4 mmol/L — ABNORMAL LOW (ref 3.5–5.1)
Sodium: 139 mmol/L (ref 135–145)
Total Bilirubin: 0.7 mg/dL (ref 0.3–1.2)
Total Protein: 8.8 g/dL — ABNORMAL HIGH (ref 6.5–8.1)

## 2019-03-19 LAB — RAPID URINE DRUG SCREEN, HOSP PERFORMED
Amphetamines: NOT DETECTED
Barbiturates: NOT DETECTED
Benzodiazepines: NOT DETECTED
Cocaine: POSITIVE — AB
Opiates: NOT DETECTED
Tetrahydrocannabinol: POSITIVE — AB

## 2019-03-19 LAB — MAGNESIUM: Magnesium: 1.9 mg/dL (ref 1.7–2.4)

## 2019-03-19 LAB — ETHANOL: Alcohol, Ethyl (B): <10 mg/dL (ref <10)

## 2019-03-19 LAB — CK: Total CK: 368 U/L (ref 49–397)

## 2019-03-19 MED ORDER — NALOXONE HCL 2 MG/2ML IJ SOSY
1.0000 mg | PREFILLED_SYRINGE | Freq: Once | INTRAMUSCULAR | Status: AC
  Administered 2019-03-19: 20:00:00 1 mg via INTRAVENOUS
  Filled 2019-03-19: qty 2

## 2019-03-19 MED ORDER — ONDANSETRON HCL 4 MG/2ML IJ SOLN
4.0000 mg | Freq: Once | INTRAMUSCULAR | Status: AC
  Administered 2019-03-19: 4 mg via INTRAVENOUS
  Filled 2019-03-19: qty 2

## 2019-03-19 MED ORDER — NALOXONE HCL 2 MG/2ML IJ SOSY
1.0000 mg | PREFILLED_SYRINGE | Freq: Once | INTRAMUSCULAR | Status: AC
  Administered 2019-03-19: 17:00:00 1 mg via INTRAVENOUS
  Filled 2019-03-19: qty 2

## 2019-03-19 MED ORDER — SODIUM CHLORIDE 0.9 % IV SOLN
INTRAVENOUS | Status: DC
  Administered 2019-03-19 – 2019-03-20 (×3): via INTRAVENOUS

## 2019-03-19 MED ORDER — POTASSIUM CHLORIDE CRYS ER 20 MEQ PO TBCR
40.0000 meq | EXTENDED_RELEASE_TABLET | Freq: Once | ORAL | Status: AC
  Administered 2019-03-19: 22:00:00 40 meq via ORAL
  Filled 2019-03-19: qty 2

## 2019-03-19 MED ORDER — ENOXAPARIN SODIUM 40 MG/0.4ML ~~LOC~~ SOLN
40.0000 mg | Freq: Every day | SUBCUTANEOUS | Status: DC
  Administered 2019-03-19: 22:00:00 40 mg via SUBCUTANEOUS
  Filled 2019-03-19 (×2): qty 0.4

## 2019-03-19 MED ORDER — ALBUTEROL SULFATE HFA 108 (90 BASE) MCG/ACT IN AERS: 1.0000 | INHALATION_SPRAY | RESPIRATORY_TRACT | Status: DC | PRN

## 2019-03-19 MED ORDER — NALOXONE HCL 4 MG/10ML IJ SOLN
1.0000 mg/h | INTRAVENOUS | Status: DC
  Administered 2019-03-19 – 2019-03-20 (×3): 1 mg/h via INTRAVENOUS
  Filled 2019-03-19 (×3): qty 10

## 2019-03-19 MED ORDER — ACETAMINOPHEN 650 MG RE SUPP: 650.0000 mg | Freq: Four times a day (QID) | RECTAL | Status: DC | PRN

## 2019-03-19 MED ORDER — ACETAMINOPHEN 325 MG PO TABS
650.0000 mg | ORAL_TABLET | Freq: Four times a day (QID) | ORAL | Status: DC | PRN
  Administered 2019-03-20: 650 mg via ORAL
  Filled 2019-03-19: qty 2

## 2019-03-19 MED ORDER — SODIUM CHLORIDE 0.9 % IV SOLN
Freq: Once | INTRAVENOUS | Status: AC
  Administered 2019-03-19: 20:00:00 via INTRAVENOUS

## 2019-03-19 MED ORDER — ALBUTEROL SULFATE (2.5 MG/3ML) 0.083% IN NEBU: 2.5000 mg | INHALATION_SOLUTION | RESPIRATORY_TRACT | Status: DC | PRN

## 2019-03-19 NOTE — ED Notes (Signed)
Pt said he wears a sling and would like to get one while he is here. Made provider aware.

## 2019-03-19 NOTE — ED Notes (Signed)
[phone]- Marcus Nichols would like update
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
## 2019-03-19 NOTE — ED Notes (Signed)
Pt said his daughter and son in law recently died unexpected. He takes care of their 54 year old son, his grandson. Also, patient's mother died around the same time his daughter died.
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
## 2019-03-19 NOTE — ED Notes (Signed)
Pt is disoriented x 4, sleepy, awakens to voice then immediately returns to sleep, incomprehensible speech, and sweating.

## 2019-03-19 NOTE — ED Notes (Signed)
Before narcan injection patient's pupils were pinpoint, patient was diaphoretic and patient was unresponsive to voice. After we gave narcan, patient became responsive verbally and now is voicing his concern for wanting to leave.
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
## 2019-03-19 NOTE — ED Notes (Signed)
Bed: IE33 Expected date:  Expected time:  Means of arrival:  Comments: EMS OD

## 2019-03-20 ENCOUNTER — Other Ambulatory Visit: Payer: Self-pay

## 2019-03-20 ENCOUNTER — Encounter (HOSPITAL_COMMUNITY): Payer: Self-pay | Admitting: *Deleted

## 2019-03-20 DIAGNOSIS — T50904A Poisoning by unspecified drugs, medicaments and biological substances, undetermined, initial encounter: Secondary | ICD-10-CM

## 2019-03-20 DIAGNOSIS — E876 Hypokalemia: Secondary | ICD-10-CM

## 2019-03-20 DIAGNOSIS — T40601A Poisoning by unspecified narcotics, accidental (unintentional), initial encounter: Secondary | ICD-10-CM

## 2019-03-20 DIAGNOSIS — I10 Essential (primary) hypertension: Secondary | ICD-10-CM | POA: Diagnosis present

## 2019-03-20 LAB — BASIC METABOLIC PANEL
Anion gap: 7 (ref 5–15)
BUN: 6 mg/dL (ref 6–20)
CO2: 23 mmol/L (ref 22–32)
Calcium: 8.5 mg/dL — ABNORMAL LOW (ref 8.9–10.3)
Chloride: 106 mmol/L (ref 98–111)
Creatinine, Ser: 0.76 mg/dL (ref 0.61–1.24)
GFR calc Af Amer: >60 mL/min (ref >60)
GFR calc non Af Amer: >60 mL/min (ref >60)
Glucose, Bld: 94 mg/dL (ref 70–99)
Potassium: 3.7 mmol/L (ref 3.5–5.1)
Sodium: 136 mmol/L (ref 135–145)

## 2019-03-20 LAB — CBC
HCT: 38.2 % — ABNORMAL LOW (ref 39.0–52.0)
Hemoglobin: 12.4 g/dL — ABNORMAL LOW (ref 13.0–17.0)
MCH: 30.7 pg (ref 26.0–34.0)
MCHC: 32.5 g/dL (ref 30.0–36.0)
MCV: 94.6 fL (ref 80.0–100.0)
Platelets: 268 10*3/uL (ref 150–400)
RBC: 4.04 MIL/uL — ABNORMAL LOW (ref 4.22–5.81)
RDW: 13.4 % (ref 11.5–15.5)
WBC: 6.3 10*3/uL (ref 4.0–10.5)
nRBC: 0 % (ref 0.0–0.2)

## 2019-03-20 LAB — MRSA PCR SCREENING: MRSA by PCR: NEGATIVE

## 2019-03-20 MED ORDER — ACETAMINOPHEN 325 MG PO TABS: 650.0000 mg | ORAL_TABLET | Freq: Four times a day (QID) | ORAL | 0 refills | Status: DC | PRN

## 2019-03-20 MED ORDER — KETOROLAC TROMETHAMINE 30 MG/ML IJ SOLN
30.0000 mg | Freq: Once | INTRAMUSCULAR | Status: AC
  Administered 2019-03-20: 05:00:00 30 mg via INTRAVENOUS
  Filled 2019-03-20: qty 1

## 2019-03-20 MED ORDER — LOSARTAN POTASSIUM 50 MG PO TABS: 50.0000 mg | ORAL_TABLET | Freq: Every day | ORAL | 3 refills | Status: DC

## 2019-03-20 MED ORDER — LOSARTAN POTASSIUM 50 MG PO TABS
50.0000 mg | ORAL_TABLET | Freq: Every day | ORAL | Status: DC
  Administered 2019-03-20: 11:00:00 50 mg via ORAL
  Filled 2019-03-20 (×2): qty 1

## 2019-03-20 MED ORDER — GABAPENTIN 300 MG PO CAPS
300.0000 mg | ORAL_CAPSULE | Freq: Two times a day (BID) | ORAL | Status: DC
  Filled 2019-03-20: qty 1

## 2019-03-20 MED ORDER — GABAPENTIN 300 MG PO CAPS: 600.0000 mg | ORAL_CAPSULE | Freq: Two times a day (BID) | ORAL | 1 refills | Status: DC

## 2019-03-20 MED ORDER — AMLODIPINE BESYLATE 5 MG PO TABS: 5.0000 mg | ORAL_TABLET | Freq: Every day | ORAL | 3 refills | Status: DC

## 2019-03-20 MED ORDER — CHLORHEXIDINE GLUCONATE CLOTH 2 % EX PADS: 6.0000 | MEDICATED_PAD | Freq: Every day | CUTANEOUS | Status: DC

## 2019-03-20 MED ORDER — HYDRALAZINE HCL 20 MG/ML IJ SOLN
10.0000 mg | Freq: Once | INTRAMUSCULAR | Status: AC
  Administered 2019-03-20: 10 mg via INTRAVENOUS
  Filled 2019-03-20: qty 1

## 2019-03-20 MED ORDER — AMLODIPINE BESYLATE 5 MG PO TABS: 5.0000 mg | ORAL_TABLET | Freq: Every day | ORAL | Status: DC

## 2019-03-20 MED ORDER — ORAL CARE MOUTH RINSE: 15.0000 mL | Freq: Two times a day (BID) | OROMUCOSAL | Status: DC

## 2019-03-20 MED ORDER — AMLODIPINE BESYLATE 5 MG PO TABS
5.0000 mg | ORAL_TABLET | Freq: Every day | ORAL | Status: DC
  Administered 2019-03-20: 11:00:00 5 mg via ORAL
  Filled 2019-03-20: qty 1

## 2019-03-20 MED ORDER — LOSARTAN POTASSIUM 100 MG PO TABS: 100.0000 mg | ORAL_TABLET | Freq: Every day | ORAL | 3 refills | Status: DC

## 2019-03-20 MED ORDER — TRAMADOL HCL 50 MG PO TABS: 50.0000 mg | ORAL_TABLET | Freq: Three times a day (TID) | ORAL | 0 refills | Status: DC | PRN

## 2019-03-20 MED ORDER — LOSARTAN POTASSIUM 50 MG PO TABS: 50.0000 mg | ORAL_TABLET | Freq: Every day | ORAL | Status: DC

## 2019-03-20 MED ORDER — LOSARTAN POTASSIUM 50 MG PO TABS
50.0000 mg | ORAL_TABLET | Freq: Once | ORAL | Status: AC
  Administered 2019-03-20: 13:00:00 50 mg via ORAL

## 2019-03-20 NOTE — TOC Initial Note (Signed)
Transition of Care [facility]) - Initial/Assessment Note    Patient Details  Name: Marcus Nichols MRN: [id number] Date of Birth: 07/17/1966  [facility] CM/SW Contact:    Bartholome Bill, RN Phone Number: 03/20/2019, 9:09 AM  Clinical Narrative:                  CM consult for assistance with getting BP meds and appointment at clinic. This CM spoke with pt about getting the two BP meds Cozaar and Norvasc prescribed to him at discharge. Pt was informed that if he has a VIP card at [employer] his Cozaar would be $3 and his Norvasc would be free. He states that he will go get a membership. He also states that he will make his own appointment at the [facility].  Expected Discharge Plan: Home/Self Care Barriers to Discharge: No Barriers Identified   Expected Discharge Plan and Services Expected Discharge Plan: Home/Self Care   Discharge Planning Services: CM Consult, Indigent Health Clinic     Expected Discharge Date: 03/20/19                        Activities of Daily Living Home Assistive Devices/Equipment: CPAP(has not been using.) ADL Screening (condition at time of admission) Patient's cognitive ability adequate to safely complete daily activities?: Yes Is the patient deaf or have difficulty hearing?: No Does the patient have difficulty seeing, even when wearing glasses/contacts?: No Does the patient have difficulty concentrating, remembering, or making decisions?: No Patient able to express need for assistance with ADLs?: Yes Does the patient have difficulty dressing or bathing?: Yes Independently performs ADLs?: No Communication: Independent Dressing (OT): Needs assistance Is this a change from baseline?: Pre-admission baseline Grooming: Independent Feeding: Independent Bathing: Needs assistance Is this a change from baseline?: Pre-admission baseline Toileting: Independent In/Out Bed: Needs assistance Is this a change from baseline?:  Pre-admission baseline Walks in Home: Independent Does the patient have difficulty walking or climbing stairs?: No Weakness of Legs: None Weakness of Arms/Hands: Left     Admission diagnosis:  Pain [R52] Polysubstance abuse (HCC) [F19.10] Drug overdose, undetermined intent, initial encounter [T50.904A] Patient Active Problem List   Diagnosis Date Noted  . Opiate overdose (HCC) 03/19/2019  . Leukocytosis 03/19/2019  . Hypokalemia 03/19/2019  . Left arm pain 03/19/2019  . Elevated blood pressure reading 03/19/2019  . Comminuted fracture of humerus, left, open, initial encounter 02/28/2019   PCP:  Patient, No Pcp Per Pharmacy:   [id number] - [place], [place] - [address] [place] [address] Phone: [phone] Fax: [phone]      Readmission Risk Interventions No flowsheet data found.

## 2019-03-20 NOTE — Progress Notes (Signed)
Patient had Tylenol @ 0114. Patient is c/o of left arm pain now,PCP was notified
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
## 2019-03-20 NOTE — Progress Notes (Signed)
Call received from Dr. Isidoro Donning. Updated on Pt status and HTN despite meds and IV PRN hydralazine. Per MD if BP less than SBP 200 ok to go home. BP checked yielding 242/102 at 1245, rechecked at 1300 yielding 189/90 . Dr. Isidoro Donning updated. Losartan PO increased to 100 mg daily. Additional 50 mg tab given. Will recheck BP in 30 minutes. Pt anxious and ready to go home. Encouraged to relax, lights turned low and stimulation in room reduced. Will monitor.

## 2019-03-20 NOTE — Progress Notes (Signed)
BP rechecked at 1348 yielding 192/98. Pt requesting to go home, refusing any additional meds. Pt alert oriented, denies pain.  Dr. Isidoro Donning paged and updated. Per MD ok for Pt to d/c, Pt instructed to take all meds as prescribed. RX given with d/c paperwork and instructions.

## 2020-03-28 ENCOUNTER — Emergency Department (HOSPITAL_COMMUNITY)
Admission: EM | Admit: 2020-03-28 | Discharge: 2020-03-29 | Disposition: A | Discharge status: Home / Self Care | Payer: Self-pay | Attending: Emergency Medicine | Admitting: Emergency Medicine

## 2020-03-28 DIAGNOSIS — I10 Essential (primary) hypertension: Secondary | ICD-10-CM | POA: Insufficient documentation

## 2020-03-28 DIAGNOSIS — Y939 Activity, unspecified: Secondary | ICD-10-CM | POA: Insufficient documentation

## 2020-03-28 DIAGNOSIS — B20 Human immunodeficiency virus [HIV] disease: Secondary | ICD-10-CM | POA: Insufficient documentation

## 2020-03-28 DIAGNOSIS — F17210 Nicotine dependence, cigarettes, uncomplicated: Secondary | ICD-10-CM | POA: Insufficient documentation

## 2020-03-28 DIAGNOSIS — Y929 Unspecified place or not applicable: Secondary | ICD-10-CM | POA: Insufficient documentation

## 2020-03-28 DIAGNOSIS — J45909 Unspecified asthma, uncomplicated: Secondary | ICD-10-CM | POA: Insufficient documentation

## 2020-03-28 DIAGNOSIS — Z79899 Other long term (current) drug therapy: Secondary | ICD-10-CM | POA: Insufficient documentation

## 2020-03-28 DIAGNOSIS — Z7982 Long term (current) use of aspirin: Secondary | ICD-10-CM | POA: Insufficient documentation

## 2020-03-28 DIAGNOSIS — Y999 Unspecified external cause status: Secondary | ICD-10-CM | POA: Insufficient documentation

## 2020-03-28 DIAGNOSIS — T50901A Poisoning by unspecified drugs, medicaments and biological substances, accidental (unintentional), initial encounter: Secondary | ICD-10-CM | POA: Insufficient documentation

## 2020-03-28 LAB — COMPREHENSIVE METABOLIC PANEL
ALT: 160 U/L — ABNORMAL HIGH (ref 0–44)
AST: 235 U/L — ABNORMAL HIGH (ref 15–41)
Albumin: 3.5 g/dL (ref 3.5–5.0)
Alkaline Phosphatase: 67 U/L (ref 38–126)
Anion gap: 18 — ABNORMAL HIGH (ref 5–15)
BUN: 9 mg/dL (ref 6–20)
CO2: 18 mmol/L — ABNORMAL LOW (ref 22–32)
Calcium: 8.9 mg/dL (ref 8.9–10.3)
Chloride: 102 mmol/L (ref 98–111)
Creatinine, Ser: 1.64 mg/dL — ABNORMAL HIGH (ref 0.61–1.24)
GFR calc Af Amer: 54 mL/min — ABNORMAL LOW (ref >60)
GFR calc non Af Amer: 47 mL/min — ABNORMAL LOW (ref >60)
Glucose, Bld: 79 mg/dL (ref 70–99)
Potassium: 4.1 mmol/L (ref 3.5–5.1)
Sodium: 138 mmol/L (ref 135–145)
Total Bilirubin: 0.8 mg/dL (ref 0.3–1.2)
Total Protein: 8 g/dL (ref 6.5–8.1)

## 2020-03-28 LAB — CBC WITH DIFFERENTIAL/PLATELET
Abs Immature Granulocytes: 0.1 10*3/uL — ABNORMAL HIGH (ref 0.00–0.07)
Basophils Absolute: 0 10*3/uL (ref 0.0–0.1)
Basophils Relative: 0 %
Eosinophils Absolute: 0 10*3/uL (ref 0.0–0.5)
Eosinophils Relative: 0 %
HCT: 44.9 % (ref 39.0–52.0)
Hemoglobin: 14.6 g/dL (ref 13.0–17.0)
Immature Granulocytes: 1 %
Lymphocytes Relative: 6 %
Lymphs Abs: 0.7 10*3/uL (ref 0.7–4.0)
MCH: 30 pg (ref 26.0–34.0)
MCHC: 32.5 g/dL (ref 30.0–36.0)
MCV: 92.4 fL (ref 80.0–100.0)
Monocytes Absolute: 1 10*3/uL (ref 0.1–1.0)
Monocytes Relative: 8 %
Neutro Abs: 10 10*3/uL — ABNORMAL HIGH (ref 1.7–7.7)
Neutrophils Relative %: 85 %
Platelets: 220 10*3/uL (ref 150–400)
RBC: 4.86 MIL/uL (ref 4.22–5.81)
RDW: 13.9 % (ref 11.5–15.5)
WBC: 11.7 10*3/uL — ABNORMAL HIGH (ref 4.0–10.5)
nRBC: 0 % (ref 0.0–0.2)

## 2020-03-28 LAB — RAPID URINE DRUG SCREEN, HOSP PERFORMED
Amphetamines: NOT DETECTED
Barbiturates: NOT DETECTED
Benzodiazepines: NOT DETECTED
Cocaine: POSITIVE — AB
Opiates: NOT DETECTED
Tetrahydrocannabinol: POSITIVE — AB

## 2020-03-28 MED ORDER — LORAZEPAM 2 MG/ML IJ SOLN
INTRAMUSCULAR | Status: AC
  Administered 2020-03-28: 2 mg
  Filled 2020-03-28: qty 1

## 2020-03-28 MED ORDER — LACTATED RINGERS IV BOLUS
1000.0000 mL | Freq: Once | INTRAVENOUS | Status: AC
  Administered 2020-03-28: 1000 mL via INTRAVENOUS

## 2020-03-28 MED ORDER — LORAZEPAM 2 MG/ML IJ SOLN
INTRAMUSCULAR | Status: AC
  Filled 2020-03-28: qty 1

## 2020-03-28 NOTE — ED Triage Notes (Signed)
To ed via [facility] from a private residence- 911 was called re: Overdose- on arrival this pt had been given 2mg  Narcan per . And was breathing on own. Pt's friend at scene was having CPR performed.  On arrival to ED, nassal trumpet was in place in right nares. Pt was somnolent, when pt waas moved over to bed he became combative, nonverbal, dr [employer] at bedside- medictions ordered

## 2020-03-28 NOTE — ED Notes (Signed)
[phone] pts fiance Revonda Standard wants an update

## 2020-03-31 NOTE — ED Provider Notes (Signed)
[name] EMERGENCY DEPARTMENT Provider Note   CSN: [id number] Arrival date & time: 03/28/20  1806     History Chief Complaint  Patient presents with  . Drug Overdose    Marcus Nichols is a 54 y.o. male.  HPI   53yM with drug overdose. EMS called to scene for CPR in progress. It was actually another person than this patient but he was noted to be poorly arousable himself with pinpoint pupils and poor respiratory effort. Given narcan just before arrival and now extremely combative and requiring numerous people to provide physical restraint. He is yelling and cannot redirect him.  Past Medical History:  Diagnosis Date  . Asthma   . Eczema   . Enlarged prostate   . HIV (human immunodeficiency virus infection) (HCC)   . Humerus fracture    left   . Inguinal hernia   . Schizophrenia (HCC)   . Sleep apnea    does not wear CPAP ( per spouse )    Patient Active Problem List   Diagnosis Date Noted  . Accelerated hypertension 03/20/2019  . Opiate overdose (HCC) 03/19/2019  . Leukocytosis 03/19/2019  . Hypokalemia 03/19/2019  . Left arm pain 03/19/2019  . Elevated blood pressure reading 03/19/2019  . Comminuted fracture of humerus, left, open, initial encounter 02/28/2019    Past Surgical History:  Procedure Laterality Date  . MULTIPLE TOOTH EXTRACTIONS    . ORIF HUMERUS FRACTURE Left 03/07/2019   Procedure: OPEN REDUCTION INTERNAL FIXATION (ORIF) HUMERAL SHAFT FRACTURE;  Surgeon: Roby Lofts, MD;  Location: [facility] OR;  Service: Orthopedics;  Laterality: Left;       Family History  Problem Relation Age of Onset  . Hypertension Mother   . Congestive Heart Failure Mother     Social History   Tobacco Use  . Smoking status: Current Every Day Smoker    Packs/day: 1.00    Types: Cigarettes  . Smokeless tobacco: Never Used  Substance Use Topics  . Alcohol use: Yes    Comment: pt. stated that he had some "hennessey"  . Drug use: Yes    Types:  Cocaine, Marijuana    Comment: + Cocaine and +THC    Home Medications Prior to Admission medications   Medication Sig Start Date End Date Taking? Authorizing Provider  aspirin EC 81 MG tablet Take 81 mg by mouth daily.   Yes [provider]  Vitamin D, Cholecalciferol, 10 MCG (400 UNIT) TABS Take 400 Units by mouth daily.   Yes [provider]  acetaminophen (TYLENOL) 325 MG tablet Take 2 tablets (650 mg total) by mouth every 6 (six) hours as needed for mild pain. Patient not taking: Reported on 03/28/2020 03/20/19   Rai, Delene Ruffini, MD  amLODipine (NORVASC) 5 MG tablet Take 1 tablet (5 mg total) by mouth daily. Patient not taking: Reported on 03/28/2020 03/20/19   Rai, Delene Ruffini, MD  gabapentin (NEURONTIN) 300 MG capsule Take 2 capsules (600 mg total) by mouth 2 (two) times daily. Patient not taking: Reported on 03/28/2020 03/20/19   Rai, Delene Ruffini, MD  losartan (COZAAR) 100 MG tablet Take 1 tablet (100 mg total) by mouth daily. Patient not taking: Reported on 03/28/2020 03/21/19   Rai, Delene Ruffini, MD  traMADol (ULTRAM) 50 MG tablet Take 1 tablet (50 mg total) by mouth every 8 (eight) hours as needed for severe pain. Patient not taking: Reported on 03/28/2020 03/20/19   Cathren Harsh, MD    Allergies  Patient has no known allergies.  Review of Systems   Review of Systems Level 5 caveat because pt is not answering questions.  Physical Exam Updated Vital Signs BP (!) 190/100   Pulse 84   Temp 98.2 F (36.8 C) (Temporal)   Resp 18   Ht 5\' 11"  (1.803 m)   Wt 77.1 kg   SpO2 100%   BMI 23.71 kg/m   Physical Exam Vitals and nursing note reviewed.  Constitutional:      General: He is in acute distress.     Appearance: He is well-developed. He is diaphoretic.     Comments: Being physically restrained. Yelling. Not answering questions.   HENT:     Head: Normocephalic and atraumatic.  Eyes:     General:        Right eye: No discharge.        Left eye: No discharge.       Conjunctiva/sclera: Conjunctivae normal.  Cardiovascular:     Rate and Rhythm: Normal rate and regular rhythm.     Heart sounds: Normal heart sounds. No murmur. No friction rub. No gallop.   Pulmonary:     Effort: Pulmonary effort is normal. No respiratory distress.     Breath sounds: Normal breath sounds.  Abdominal:     General: There is no distension.     Palpations: Abdomen is soft.     Tenderness: There is no abdominal tenderness.  Musculoskeletal:        General: No tenderness.     Cervical back: Neck supple.  Skin:    General: Skin is warm.     ED Results / Procedures / Treatments   Labs (all labs ordered are listed, but only abnormal results are displayed) Labs Reviewed  COMPREHENSIVE METABOLIC PANEL - Abnormal; Notable for the following components:      Result Value   CO2 18 (*)    Creatinine, Ser 1.64 (*)    AST 235 (*)    ALT 160 (*)    GFR calc non Af Amer 47 (*)    GFR calc Af Amer 54 (*)    Anion gap 18 (*)    All other components within normal limits  CBC WITH DIFFERENTIAL/PLATELET - Abnormal; Notable for the following components:   WBC 11.7 (*)    Neutro Abs 10.0 (*)    Abs Immature Granulocytes 0.10 (*)    All other components within normal limits  RAPID URINE DRUG SCREEN, HOSP PERFORMED - Abnormal; Notable for the following components:   Cocaine POSITIVE (*)    Tetrahydrocannabinol POSITIVE (*)    All other components within normal limits    EKG EKG Interpretation  Date/Time:  Saturday Mar 28 2020 18:09:25 EDT Ventricular Rate:  115 PR Interval:    QRS Duration: 86 QT Interval:  306 QTC Calculation: 424 R Axis:   84 Text Interpretation: Sinus tachycardia Left ventricular hypertrophy Confirmed by 11[birth year]1983 [phone]) on 03/28/2020 8:09:34 PM   Radiology No results found.  Procedures Procedures (including critical care time)  Medications Ordered in ED Medications  LORazepam (ATIVAN) 2 MG/ML injection (2 mg  Given 03/28/20 1803)   lactated ringers bolus 1,000 mL (0 mLs Intravenous Stopped 03/28/20 2154)    ED Course  I have reviewed the triage vital signs and the nursing notes.  Pertinent labs & imaging results that were available during my care of the patient were reviewed by me and considered in my medical decision making (see chart for details).  MDM Rules/Calculators/A&P                      66yM with accidental drug overdose. He was physically and then chemically restrained. Slept for several hours. Woke up requesting to leave. Had no additional complaints. Just wants to go.   Final Clinical Impression(s) / ED Diagnoses Final diagnoses:  Accidental drug overdose, initial encounter    Rx / DC Orders ED Discharge Orders    None       Virgel Manifold, MD 03/31/20 2334

## 2020-12-07 ENCOUNTER — Other Ambulatory Visit: Payer: Self-pay

## 2020-12-07 ENCOUNTER — Ambulatory Visit
Admission: EM | Admit: 2020-12-07 | Discharge: 2020-12-07 | Disposition: A | Discharge status: Home / Self Care | Payer: Self-pay | Attending: Internal Medicine | Admitting: Internal Medicine

## 2020-12-07 DIAGNOSIS — L2084 Intrinsic (allergic) eczema: Secondary | ICD-10-CM

## 2020-12-07 MED ORDER — HYDROXYZINE HCL 25 MG PO TABS: 25.0000 mg | ORAL_TABLET | Freq: Three times a day (TID) | ORAL | 0 refills | Status: DC | PRN

## 2020-12-07 MED ORDER — PREDNISONE 20 MG PO TABS: 20.0000 mg | ORAL_TABLET | Freq: Every day | ORAL | 0 refills | Status: AC

## 2020-12-07 NOTE — ED Triage Notes (Signed)
Here for rash onset 2 days on chest and arms  Hx of Eczema  Also reports son tested positive for COVID and would like to be tested.

## 2020-12-08 NOTE — ED Provider Notes (Signed)
[facility]    CSN: [id number] Arrival date & time: 12/07/20  1549      History   Chief Complaint Chief Complaint  Patient presents with  . Rash    HPI Marcus Nichols is a 55 y.o. male comes to the urgent care with a 2-day history of rash on her arms and chest.  Symptoms have been worsening over the past couple of days.  Patient has a history of eczema but has not been using any medications or emollients.  Skin is cracked in the flexural areas but no discharge.Marland Kitchen  No erythema no fever or chills.  Patient's son was diagnosed with COVID-19 infection on 1 03/2021.  He has no symptoms currently and would like to be tested for COVID-19 infection. HPI  Past Medical History:  Diagnosis Date  . Asthma   . Eczema   . Enlarged prostate   . HIV (human immunodeficiency virus infection) (HCC)   . Humerus fracture    left   . Inguinal hernia   . Schizophrenia (HCC)   . Sleep apnea    does not wear CPAP ( per spouse )    Patient Active Problem List   Diagnosis Date Noted  . Accelerated hypertension 03/20/2019  . Opiate overdose (HCC) 03/19/2019  . Leukocytosis 03/19/2019  . Hypokalemia 03/19/2019  . Left arm pain 03/19/2019  . Elevated blood pressure reading 03/19/2019  . Comminuted fracture of humerus, left, open, initial encounter 02/28/2019    Past Surgical History:  Procedure Laterality Date  . MULTIPLE TOOTH EXTRACTIONS    . ORIF HUMERUS FRACTURE Left 03/07/2019   Procedure: OPEN REDUCTION INTERNAL FIXATION (ORIF) HUMERAL SHAFT FRACTURE;  Surgeon: Roby Lofts, MD;  Location: [facility] OR;  Service: Orthopedics;  Laterality: Left;       Home Medications    Prior to Admission medications   Medication Sig Start Date End Date Taking? Authorizing Provider  hydrOXYzine (ATARAX/VISTARIL) 25 MG tablet Take 1 tablet (25 mg total) by mouth every 8 (eight) hours as needed for itching. 12/07/20  Yes Debrina Kizer, Britta Mccreedy, MD  predniSONE (DELTASONE) 20 MG tablet Take 1 tablet  (20 mg total) by mouth daily for 5 days. 12/07/20 12/12/20 Yes Collins Kerby, Britta Mccreedy, MD  aspirin EC 81 MG tablet Take 81 mg by mouth daily.    [provider]  Vitamin D, Cholecalciferol, 10 MCG (400 UNIT) TABS Take 400 Units by mouth daily.    [provider]  amLODipine (NORVASC) 5 MG tablet Take 1 tablet (5 mg total) by mouth daily. Patient not taking: Reported on 03/28/2020 03/20/19 12/07/20  Rai, Delene Ruffini, MD  gabapentin (NEURONTIN) 300 MG capsule Take 2 capsules (600 mg total) by mouth 2 (two) times daily. Patient not taking: Reported on 03/28/2020 03/20/19 12/07/20  Rai, Delene Ruffini, MD  losartan (COZAAR) 100 MG tablet Take 1 tablet (100 mg total) by mouth daily. Patient not taking: Reported on 03/28/2020 03/21/19 12/07/20  Cathren Harsh, MD    Family History Family History  Problem Relation Age of Onset  . Hypertension Mother   . Congestive Heart Failure Mother     Social History Social History   Tobacco Use  . Smoking status: Current Every Day Smoker    Packs/day: 1.00    Types: Cigarettes  . Smokeless tobacco: Never Used  Vaping Use  . Vaping Use: Never used  Substance Use Topics  . Alcohol use: Yes    Comment: pt. stated that he had some "hennessey"  .  Drug use: Yes    Types: Cocaine, Marijuana    Comment: + Cocaine and +THC     Allergies   Patient has no known allergies.   Review of Systems Review of Systems  Gastrointestinal: Negative.   Musculoskeletal: Negative.   Skin: Positive for rash. Negative for color change and wound.  Neurological: Negative.      Physical Exam Triage Vital Signs ED Triage Vitals  Enc Vitals Group     BP 12/07/20 2023 (!) 185/90     Pulse Rate 12/07/20 2023 75     Resp 12/07/20 2023 16     Temp 12/07/20 2023 98.1 F (36.7 C)     Temp src --      SpO2 12/07/20 2023 99 %     Weight --      Height --      Head Circumference --      Peak Flow --      Pain Score 12/07/20 1916 7     Pain Loc --      Pain Edu? --       Excl. in GC? --    No data found.  Updated Vital Signs BP (!) 185/90   Pulse 75   Temp 98.1 F (36.7 C)   Resp 16   SpO2 99%   Visual Acuity Right Eye Distance:   Left Eye Distance:   Bilateral Distance:    Right Eye Near:   Left Eye Near:    Bilateral Near:     Physical Exam Vitals and nursing note reviewed.  Constitutional:      General: He is not in acute distress.    Appearance: He is not ill-appearing.  Cardiovascular:     Rate and Rhythm: Normal rate and regular rhythm.     Pulses: Normal pulses.     Heart sounds: Normal heart sounds.  Pulmonary:     Effort: Pulmonary effort is normal.     Breath sounds: Normal breath sounds.  Skin:    Comments: Extensive eczematous rash over the flexural areas and the chest as well as the neck.  Excoriations and fissuring of the skin in the antecubital fossae.  Neurological:     Mental Status: He is alert.      UC Treatments / Results  Labs (all labs ordered are listed, but only abnormal results are displayed) Labs Reviewed  NOVEL CORONAVIRUS, NAA    EKG   Radiology No results found.  Procedures Procedures (including critical care time)  Medications Ordered in UC Medications - No data to display  Initial Impression / Assessment and Plan / UC Course  I have reviewed the triage vital signs and the nursing notes.  Pertinent labs & imaging results that were available during my care of the patient were reviewed by me and considered in my medical decision making (see chart for details).     1.  Eczematous rash with flareup: Prednisone 20 mg orally daily for 5 days Hydroxyzine as needed for itching If you notice worsening symptoms, erythema or fever please return to urgent care to be reevaluated  2.  Exposure to COVID-19, symptomatic: COVID-19 PCR test has been sent We will call patient if labs are abnormal. Final Clinical Impressions(s) / UC Diagnoses   Final diagnoses:  Intrinsic eczema    Discharge Instructions   None    ED Prescriptions    Medication Sig Dispense Auth. Provider   hydrOXYzine (ATARAX/VISTARIL) 25 MG tablet Take 1 tablet (25 mg total) by  mouth every 8 (eight) hours as needed for itching. 20 tablet Samanda Buske, Britta Mccreedy, MD   predniSONE (DELTASONE) 20 MG tablet Take 1 tablet (20 mg total) by mouth daily for 5 days. 5 tablet Ayman Brull, Britta Mccreedy, MD     PDMP not reviewed this encounter.   Merrilee Jansky, MD 12/08/20 [phone]

## 2020-12-12 LAB — NOVEL CORONAVIRUS, NAA: SARS-CoV-2, NAA: DETECTED — AB

## 2020-12-13 ENCOUNTER — Telehealth: Payer: Self-pay | Admitting: Physician Assistant

## 2020-12-13 NOTE — Telephone Encounter (Signed)
Called todiscuss with patient about COVID-19 symptoms and the use of one of the available treatments for those with mild to moderate Covid symptoms and at a high risk of hospitalization. Pt appears to qualify for outpatient treatment due to co-morbid conditions and/or a member of an at-risk group in accordance with the FDA Emergency Use Authorization.     Unable to reach pt- only number listed in his chart is his Ex girlfriend and she doesn't have his new number and he doesn't have mychart. She asked that her number be removed from his chart.   Cline Crock PA-C  [facility]

## 2021-05-04 ENCOUNTER — Emergency Department (HOSPITAL_COMMUNITY): Payer: Self-pay

## 2021-05-04 ENCOUNTER — Other Ambulatory Visit: Payer: Self-pay

## 2021-05-04 ENCOUNTER — Inpatient Hospital Stay (HOSPITAL_COMMUNITY)
Admission: EM | Admit: 2021-05-04 | Discharge: 2021-05-06 | DRG: 065 | Disposition: A | Discharge status: Home / Self Care | Payer: Self-pay | Attending: Internal Medicine | Admitting: Internal Medicine

## 2021-05-04 DIAGNOSIS — N400 Enlarged prostate without lower urinary tract symptoms: Secondary | ICD-10-CM | POA: Diagnosis present

## 2021-05-04 DIAGNOSIS — R202 Paresthesia of skin: Secondary | ICD-10-CM

## 2021-05-04 DIAGNOSIS — F29 Unspecified psychosis not due to a substance or known physiological condition: Secondary | ICD-10-CM

## 2021-05-04 DIAGNOSIS — Z9114 Patient's other noncompliance with medication regimen: Secondary | ICD-10-CM

## 2021-05-04 DIAGNOSIS — I160 Hypertensive urgency: Secondary | ICD-10-CM | POA: Diagnosis present

## 2021-05-04 DIAGNOSIS — F1910 Other psychoactive substance abuse, uncomplicated: Secondary | ICD-10-CM

## 2021-05-04 DIAGNOSIS — I63411 Cerebral infarction due to embolism of right middle cerebral artery: Principal | ICD-10-CM | POA: Diagnosis present

## 2021-05-04 DIAGNOSIS — E119 Type 2 diabetes mellitus without complications: Secondary | ICD-10-CM | POA: Diagnosis present

## 2021-05-04 DIAGNOSIS — R519 Headache, unspecified: Secondary | ICD-10-CM

## 2021-05-04 DIAGNOSIS — Z720 Tobacco use: Secondary | ICD-10-CM

## 2021-05-04 DIAGNOSIS — G4730 Sleep apnea, unspecified: Secondary | ICD-10-CM | POA: Diagnosis present

## 2021-05-04 DIAGNOSIS — R531 Weakness: Secondary | ICD-10-CM

## 2021-05-04 DIAGNOSIS — Z7982 Long term (current) use of aspirin: Secondary | ICD-10-CM

## 2021-05-04 DIAGNOSIS — F1410 Cocaine abuse, uncomplicated: Secondary | ICD-10-CM | POA: Diagnosis present

## 2021-05-04 DIAGNOSIS — I6330 Cerebral infarction due to thrombosis of unspecified cerebral artery: Secondary | ICD-10-CM

## 2021-05-04 DIAGNOSIS — J45909 Unspecified asthma, uncomplicated: Secondary | ICD-10-CM | POA: Diagnosis present

## 2021-05-04 DIAGNOSIS — F17210 Nicotine dependence, cigarettes, uncomplicated: Secondary | ICD-10-CM | POA: Diagnosis present

## 2021-05-04 DIAGNOSIS — F1010 Alcohol abuse, uncomplicated: Secondary | ICD-10-CM | POA: Diagnosis present

## 2021-05-04 DIAGNOSIS — I639 Cerebral infarction, unspecified: Secondary | ICD-10-CM

## 2021-05-04 DIAGNOSIS — F1020 Alcohol dependence, uncomplicated: Secondary | ICD-10-CM

## 2021-05-04 DIAGNOSIS — F1210 Cannabis abuse, uncomplicated: Secondary | ICD-10-CM | POA: Diagnosis present

## 2021-05-04 DIAGNOSIS — Z9119 Patient's noncompliance with other medical treatment and regimen: Secondary | ICD-10-CM

## 2021-05-04 DIAGNOSIS — R079 Chest pain, unspecified: Secondary | ICD-10-CM

## 2021-05-04 DIAGNOSIS — Z8249 Family history of ischemic heart disease and other diseases of the circulatory system: Secondary | ICD-10-CM

## 2021-05-04 DIAGNOSIS — E785 Hyperlipidemia, unspecified: Secondary | ICD-10-CM | POA: Diagnosis present

## 2021-05-04 DIAGNOSIS — R29707 NIHSS score 7: Secondary | ICD-10-CM | POA: Diagnosis present

## 2021-05-04 DIAGNOSIS — G8194 Hemiplegia, unspecified affecting left nondominant side: Secondary | ICD-10-CM | POA: Diagnosis present

## 2021-05-04 DIAGNOSIS — B20 Human immunodeficiency virus [HIV] disease: Secondary | ICD-10-CM | POA: Diagnosis present

## 2021-05-04 DIAGNOSIS — F209 Schizophrenia, unspecified: Secondary | ICD-10-CM | POA: Diagnosis present

## 2021-05-04 DIAGNOSIS — I10 Essential (primary) hypertension: Secondary | ICD-10-CM | POA: Diagnosis present

## 2021-05-04 DIAGNOSIS — Z20822 Contact with and (suspected) exposure to covid-19: Secondary | ICD-10-CM | POA: Diagnosis present

## 2021-05-04 DIAGNOSIS — Z79899 Other long term (current) drug therapy: Secondary | ICD-10-CM

## 2021-05-04 LAB — URINALYSIS, ROUTINE W REFLEX MICROSCOPIC
Bacteria, UA: NONE SEEN
Bilirubin Urine: NEGATIVE
Glucose, UA: NEGATIVE mg/dL
Hgb urine dipstick: NEGATIVE
Ketones, ur: 5 mg/dL — AB
Leukocytes,Ua: NEGATIVE
Nitrite: NEGATIVE
Protein, ur: 30 mg/dL — AB
Specific Gravity, Urine: 1.027 (ref 1.005–1.030)
pH: 6 (ref 5.0–8.0)

## 2021-05-04 LAB — PROTIME-INR
INR: 1.1 (ref 0.8–1.2)
Prothrombin Time: 13.9 seconds (ref 11.4–15.2)

## 2021-05-04 LAB — COMPREHENSIVE METABOLIC PANEL
ALT: 29 U/L (ref 0–44)
AST: 48 U/L — ABNORMAL HIGH (ref 15–41)
Albumin: 3 g/dL — ABNORMAL LOW (ref 3.5–5.0)
Alkaline Phosphatase: 79 U/L (ref 38–126)
Anion gap: 7 (ref 5–15)
BUN: 7 mg/dL (ref 6–20)
CO2: 23 mmol/L (ref 22–32)
Calcium: 8.3 mg/dL — ABNORMAL LOW (ref 8.9–10.3)
Chloride: 106 mmol/L (ref 98–111)
Creatinine, Ser: 0.96 mg/dL (ref 0.61–1.24)
GFR, Estimated: >60 mL/min (ref >60)
Glucose, Bld: 161 mg/dL — ABNORMAL HIGH (ref 70–99)
Potassium: 4.6 mmol/L (ref 3.5–5.1)
Sodium: 136 mmol/L (ref 135–145)
Total Bilirubin: 0.9 mg/dL (ref 0.3–1.2)
Total Protein: 7.3 g/dL (ref 6.5–8.1)

## 2021-05-04 LAB — RAPID URINE DRUG SCREEN, HOSP PERFORMED
Amphetamines: NOT DETECTED
Barbiturates: NOT DETECTED
Benzodiazepines: NOT DETECTED
Cocaine: POSITIVE — AB
Opiates: NOT DETECTED
Tetrahydrocannabinol: POSITIVE — AB

## 2021-05-04 LAB — I-STAT CHEM 8, ED
BUN: 5 mg/dL — ABNORMAL LOW (ref 6–20)
Calcium, Ion: 1.02 mmol/L — ABNORMAL LOW (ref 1.15–1.40)
Chloride: 105 mmol/L (ref 98–111)
Creatinine, Ser: 0.9 mg/dL (ref 0.61–1.24)
Glucose, Bld: 167 mg/dL — ABNORMAL HIGH (ref 70–99)
HCT: 49 % (ref 39.0–52.0)
Hemoglobin: 16.7 g/dL (ref 13.0–17.0)
Potassium: 4.2 mmol/L (ref 3.5–5.1)
Sodium: 140 mmol/L (ref 135–145)
TCO2: 25 mmol/L (ref 22–32)

## 2021-05-04 LAB — CBC
HCT: 49.8 % (ref 39.0–52.0)
Hemoglobin: 16.5 g/dL (ref 13.0–17.0)
MCH: 30.4 pg (ref 26.0–34.0)
MCHC: 33.1 g/dL (ref 30.0–36.0)
MCV: 91.7 fL (ref 80.0–100.0)
Platelets: 288 10*3/uL (ref 150–400)
RBC: 5.43 MIL/uL (ref 4.22–5.81)
RDW: 13.8 % (ref 11.5–15.5)
WBC: 6.3 10*3/uL (ref 4.0–10.5)
nRBC: 0 % (ref 0.0–0.2)

## 2021-05-04 LAB — DIFFERENTIAL
Abs Immature Granulocytes: 0.01 10*3/uL (ref 0.00–0.07)
Basophils Absolute: 0 10*3/uL (ref 0.0–0.1)
Basophils Relative: 1 %
Eosinophils Absolute: 1.5 10*3/uL — ABNORMAL HIGH (ref 0.0–0.5)
Eosinophils Relative: 24 %
Immature Granulocytes: 0 %
Lymphocytes Relative: 35 %
Lymphs Abs: 2.2 10*3/uL (ref 0.7–4.0)
Monocytes Absolute: 0.4 10*3/uL (ref 0.1–1.0)
Monocytes Relative: 7 %
Neutro Abs: 2.1 10*3/uL (ref 1.7–7.7)
Neutrophils Relative %: 33 %

## 2021-05-04 LAB — RESP PANEL BY RT-PCR (FLU A&B, COVID) ARPGX2
Influenza A by PCR: NEGATIVE
Influenza B by PCR: NEGATIVE
SARS Coronavirus 2 by RT PCR: NEGATIVE

## 2021-05-04 LAB — APTT: aPTT: 28 seconds (ref 24–36)

## 2021-05-04 MED ORDER — LORAZEPAM 1 MG PO TABS: 0.0000 mg | ORAL_TABLET | Freq: Two times a day (BID) | ORAL | Status: DC

## 2021-05-04 MED ORDER — SODIUM CHLORIDE 0.9% FLUSH
3.0000 mL | Freq: Once | INTRAVENOUS | Status: DC
Start: 2021-05-04 — End: 2021-05-06

## 2021-05-04 MED ORDER — LORAZEPAM 1 MG PO TABS: 0.0000 mg | ORAL_TABLET | Freq: Four times a day (QID) | ORAL | Status: DC

## 2021-05-04 MED ORDER — LORAZEPAM 2 MG/ML IJ SOLN
0.0000 mg | Freq: Four times a day (QID) | INTRAMUSCULAR | Status: DC
Start: 2021-05-04 — End: 2021-05-05

## 2021-05-04 MED ORDER — LORAZEPAM 2 MG/ML IJ SOLN: 0.0000 mg | Freq: Two times a day (BID) | INTRAMUSCULAR | Status: DC

## 2021-05-04 MED ORDER — THIAMINE HCL 100 MG/ML IJ SOLN
500.0000 mg | Freq: Once | INTRAMUSCULAR | Status: AC
  Administered 2021-05-05: 500 mg via INTRAVENOUS
  Filled 2021-05-04: qty 6

## 2021-05-04 NOTE — ED Triage Notes (Signed)
Pt very poor historian but reports waking up at 1330 today and having weakness and inability to move his L arm and leg normal. When he stood up and felt dizzy he fell back onto the floor and hit his back, not his head. LSN last night before going to bed. Endorses headache.
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
## 2021-05-04 NOTE — ED Provider Notes (Signed)
Emergency Medicine Provider Triage Evaluation Note  Marcus Nichols , a 55 y.o. male  was evaluated in triage.  Pt complains of weakness. Patient is poor historian.  He is unable to tell me when he went to bed.  He states that he woke up at about 1 PM today with weakness and inability to move his left arm and leg is normal.  He stood up, felt dizzy and fell back onto the floor.  He hit his back. He states he was last well last night before going to bed.  He is unable to give me specific time for this.  He reports headache.  Review of Systems  Positive: Weakness, headache Negative: Chest pain, vision changes,   Physical Exam  BP (!) 162/105 (BP Location: Left Arm)   Pulse 75   Resp 18   SpO2 99%  Gen:   Awake, no distress  No facial droop.   Resp:  Normal effort  MSK:   Limited movement of left arm and leg.   Other:  Skin tear on left lateral ankle  Medical Decision Making  Medically screening exam initiated at 5:54 PM.  Appropriate orders placed.  [name] was informed that the remainder of the evaluation will be completed by another provider, this initial triage assessment does not replace that evaluation, and the importance of remaining in the ED until their evaluation is complete.  Patient outside of TPA window, doesn't meet LVO criteria.    I called CT scan, they will pull patient for scan.    Cristina Gong, PA-C 05/04/21 2237    Virgina Norfolk, DO 05/04/21 2309

## 2021-05-05 ENCOUNTER — Inpatient Hospital Stay (HOSPITAL_COMMUNITY): Payer: Self-pay

## 2021-05-05 ENCOUNTER — Encounter (HOSPITAL_COMMUNITY): Payer: Self-pay | Admitting: Family Medicine

## 2021-05-05 ENCOUNTER — Emergency Department (HOSPITAL_COMMUNITY): Payer: Self-pay

## 2021-05-05 DIAGNOSIS — I6389 Other cerebral infarction: Secondary | ICD-10-CM

## 2021-05-05 DIAGNOSIS — F1910 Other psychoactive substance abuse, uncomplicated: Secondary | ICD-10-CM

## 2021-05-05 DIAGNOSIS — F209 Schizophrenia, unspecified: Secondary | ICD-10-CM

## 2021-05-05 DIAGNOSIS — B20 Human immunodeficiency virus [HIV] disease: Secondary | ICD-10-CM

## 2021-05-05 DIAGNOSIS — I6330 Cerebral infarction due to thrombosis of unspecified cerebral artery: Secondary | ICD-10-CM | POA: Insufficient documentation

## 2021-05-05 DIAGNOSIS — Z720 Tobacco use: Secondary | ICD-10-CM

## 2021-05-05 DIAGNOSIS — F1010 Alcohol abuse, uncomplicated: Secondary | ICD-10-CM

## 2021-05-05 DIAGNOSIS — R202 Paresthesia of skin: Secondary | ICD-10-CM

## 2021-05-05 DIAGNOSIS — F29 Unspecified psychosis not due to a substance or known physiological condition: Secondary | ICD-10-CM

## 2021-05-05 DIAGNOSIS — Z21 Asymptomatic human immunodeficiency virus [HIV] infection status: Secondary | ICD-10-CM

## 2021-05-05 DIAGNOSIS — R079 Chest pain, unspecified: Secondary | ICD-10-CM

## 2021-05-05 DIAGNOSIS — I160 Hypertensive urgency: Secondary | ICD-10-CM | POA: Diagnosis present

## 2021-05-05 DIAGNOSIS — R531 Weakness: Secondary | ICD-10-CM

## 2021-05-05 DIAGNOSIS — R519 Headache, unspecified: Secondary | ICD-10-CM

## 2021-05-05 DIAGNOSIS — F1410 Cocaine abuse, uncomplicated: Secondary | ICD-10-CM

## 2021-05-05 LAB — LIPID PANEL
Cholesterol: 108 mg/dL (ref 0–200)
HDL: 41 mg/dL (ref >40)
LDL Cholesterol: 47 mg/dL (ref 0–99)
Total CHOL/HDL Ratio: 2.6 RATIO
Triglycerides: 100 mg/dL (ref <150)
VLDL: 20 mg/dL (ref 0–40)

## 2021-05-05 LAB — ETHANOL: Alcohol, Ethyl (B): <10 mg/dL (ref <10)

## 2021-05-05 LAB — ECHOCARDIOGRAM COMPLETE BUBBLE STUDY
Area-P 1/2: 2.99 cm2
P 1/2 time: 779 msec
S' Lateral: 3.4 cm

## 2021-05-05 LAB — CRYPTOCOCCAL ANTIGEN: Crypto Ag: NEGATIVE

## 2021-05-05 LAB — MAGNESIUM: Magnesium: 1.9 mg/dL (ref 1.7–2.4)

## 2021-05-05 LAB — PHOSPHORUS: Phosphorus: 2.6 mg/dL (ref 2.5–4.6)

## 2021-05-05 LAB — TROPONIN I (HIGH SENSITIVITY)
Troponin I (High Sensitivity): 9 ng/L (ref <18)
Troponin I (High Sensitivity): 7 ng/L (ref <18)

## 2021-05-05 LAB — CBG MONITORING, ED: Glucose-Capillary: 110 mg/dL — ABNORMAL HIGH (ref 70–99)

## 2021-05-05 MED ORDER — ASPIRIN EC 81 MG PO TBEC
81.0000 mg | DELAYED_RELEASE_TABLET | Freq: Every day | ORAL | Status: DC
  Administered 2021-05-05 – 2021-05-06 (×2): 81 mg via ORAL
  Filled 2021-05-05 (×2): qty 1

## 2021-05-05 MED ORDER — STROKE: EARLY STAGES OF RECOVERY BOOK: Freq: Once | Status: DC

## 2021-05-05 MED ORDER — CHLORDIAZEPOXIDE HCL 25 MG PO CAPS: 25.0000 mg | ORAL_CAPSULE | Freq: Four times a day (QID) | ORAL | Status: DC | PRN

## 2021-05-05 MED ORDER — ADULT MULTIVITAMIN W/MINERALS CH
1.0000 | ORAL_TABLET | Freq: Every day | ORAL | Status: DC
  Administered 2021-05-05 – 2021-05-06 (×2): 1 via ORAL
  Filled 2021-05-05 (×2): qty 1

## 2021-05-05 MED ORDER — THIAMINE HCL 100 MG/ML IJ SOLN
100.0000 mg | Freq: Every day | INTRAMUSCULAR | Status: DC
  Filled 2021-05-05: qty 2

## 2021-05-05 MED ORDER — LORAZEPAM 2 MG/ML IJ SOLN: 1.0000 mg | INTRAMUSCULAR | Status: DC | PRN

## 2021-05-05 MED ORDER — LACTATED RINGERS IV SOLN: INTRAVENOUS | Status: DC

## 2021-05-05 MED ORDER — LORAZEPAM 2 MG/ML IJ SOLN: 0.0000 mg | Freq: Two times a day (BID) | INTRAMUSCULAR | Status: DC

## 2021-05-05 MED ORDER — CLOPIDOGREL BISULFATE 75 MG PO TABS
75.0000 mg | ORAL_TABLET | Freq: Every day | ORAL | Status: DC
  Administered 2021-05-05 – 2021-05-06 (×2): 75 mg via ORAL
  Filled 2021-05-05 (×2): qty 1

## 2021-05-05 MED ORDER — GADOBUTROL 1 MMOL/ML IV SOLN
8.0000 mL | Freq: Once | INTRAVENOUS | Status: AC | PRN
  Administered 2021-05-05: 8 mL via INTRAVENOUS

## 2021-05-05 MED ORDER — THIAMINE HCL 100 MG PO TABS
100.0000 mg | ORAL_TABLET | Freq: Every day | ORAL | Status: DC
  Administered 2021-05-05 – 2021-05-06 (×2): 100 mg via ORAL
  Filled 2021-05-05 (×2): qty 1

## 2021-05-05 MED ORDER — ACETAMINOPHEN 650 MG RE SUPP: 650.0000 mg | RECTAL | Status: DC | PRN

## 2021-05-05 MED ORDER — ATORVASTATIN CALCIUM 80 MG PO TABS
80.0000 mg | ORAL_TABLET | Freq: Every day | ORAL | Status: DC
  Administered 2021-05-05 – 2021-05-06 (×2): 80 mg via ORAL
  Filled 2021-05-05: qty 1
  Filled 2021-05-05: qty 2

## 2021-05-05 MED ORDER — LORAZEPAM 2 MG/ML IJ SOLN: 0.0000 mg | Freq: Four times a day (QID) | INTRAMUSCULAR | Status: DC

## 2021-05-05 MED ORDER — SENNOSIDES-DOCUSATE SODIUM 8.6-50 MG PO TABS: 1.0000 | ORAL_TABLET | Freq: Every evening | ORAL | Status: DC | PRN

## 2021-05-05 MED ORDER — ENOXAPARIN SODIUM 40 MG/0.4ML IJ SOSY: 40.0000 mg | PREFILLED_SYRINGE | INTRAMUSCULAR | Status: DC

## 2021-05-05 MED ORDER — ACETAMINOPHEN 325 MG PO TABS
650.0000 mg | ORAL_TABLET | ORAL | Status: DC | PRN
  Administered 2021-05-06: 650 mg via ORAL
  Filled 2021-05-05: qty 2

## 2021-05-05 MED ORDER — LORAZEPAM 1 MG PO TABS: 1.0000 mg | ORAL_TABLET | ORAL | Status: DC | PRN

## 2021-05-05 MED ORDER — ONDANSETRON 4 MG PO TBDP: 4.0000 mg | ORAL_TABLET | Freq: Four times a day (QID) | ORAL | Status: DC | PRN

## 2021-05-05 MED ORDER — LOPERAMIDE HCL 2 MG PO CAPS: 2.0000 mg | ORAL_CAPSULE | ORAL | Status: DC | PRN

## 2021-05-05 MED ORDER — ACETAMINOPHEN 160 MG/5ML PO SOLN: 650.0000 mg | ORAL | Status: DC | PRN

## 2021-05-05 MED ORDER — FOLIC ACID 1 MG PO TABS
1.0000 mg | ORAL_TABLET | Freq: Every day | ORAL | Status: DC
  Administered 2021-05-05 – 2021-05-06 (×2): 1 mg via ORAL
  Filled 2021-05-05 (×2): qty 1

## 2021-05-05 MED ORDER — HYDROXYZINE HCL 25 MG PO TABS: 25.0000 mg | ORAL_TABLET | Freq: Four times a day (QID) | ORAL | Status: DC | PRN

## 2021-05-05 NOTE — Consult Note (Signed)
[facility]    Date of Admission:  05/04/2021             Reason for Consult: HIV   Referring Provider: Ghimire Primary Care Provider: Patient, No Pcp Per (Inactive)   ASSESSMENT:  Marcus Nichols is a 55 y/o AA male previously diagnosed with HIV disease in 1999 and out of care since 2018 presenting with left sided deficits and found to have frontal and parietal watershed embolic CVA. Stroke currently managed by neurology. For his HIV will check viral load, CD4 count and genotype. Will also check cryptococcal antigen and RPR given vision changes and headaches for completeness. Will await results of either CD4 count and crypto antigen before starting medication. Will establish care with [facility] and get him started on [facility] and [employer] for assistance. Remaining medical care per primary team.   PLAN:  1. Continue stroke management per Neurology. 2. Check viral load, CD4 and genotype. 3. Check RPR and cryptococcal antigen with vision changes. 4. Connect with care through [facility] and get financial paperwork started.    Principal Problem:   Hypertensive urgency Active Problems:   Acute left-sided weakness   Chest pain   Alcohol abuse   HIV (human immunodeficiency virus infection) (HCC)   Tobacco use   Polysubstance abuse (HCC)   Paresthesia   Schizophrenia (HCC)   Right-sided headache   Cerebral thrombosis with cerebral infarction   .  stroke: mapping our early stages of recovery book   Does not apply Once  . aspirin EC  81 mg Oral Daily  . atorvastatin  80 mg Oral Daily  . clopidogrel  75 mg Oral Daily  . enoxaparin (LOVENOX) injection  40 mg Subcutaneous Q24H  . folic acid  1 mg Oral Daily  . LORazepam  0-4 mg Intravenous Q6H   Followed by  . [START ON 05/07/2021] LORazepam  0-4 mg Intravenous Q12H  . multivitamin with minerals  1 tablet Oral Daily  . sodium chloride flush  3 mL Intravenous Once  . thiamine  100 mg Oral Daily    Or  . thiamine  100 mg Intravenous Daily     HPI: Marcus Nichols is a 55 y.o. male with previous medical history significant for hypertension, schizophrenia, sleep apena, alcohol/cocaine/tobacco use and poorly controlled HIV presenting with left sided weakness and concern for acute CVA.   Marcus Nichols began having symptoms yesterday with mild weakness and numbness to his left arm and leg that worsened yesterday evening to include headache, lightheadedness, gait instability and black spots in his bilateral vision. MRI brain with patchy acute ischemic nonhemorrhagic infarcts involving the right frontal and parietal lobes largely watershed distribution.   ID consulted for evaluation and treatment of HIV disease. History was provided by [name], Marcus Nichols significant other with his permission as he did not recall the information. Marcus Nichols was initially diagnosed with HIV in 1999 with risk factor of drug use. Initial CD4 count and viral load are unavailable/unknown. He has an ART history of Sustiva, Combivir and Genvoya. Previously in care in [place] with Dr. Laural Benes before moving to [place]. Had been undetectable for nearly 20 years. Had trouble getting his records and became frustrated and did not pursue further. He has been off medication since early 2018. Had several life stressors including the death of his mother and daughter. Has been drinking alcohol. Denies fevers, chills, night sweats, neck pain/stiffness, nausea, diarrhea, vomiting, lesions or rashes.  Marcus Nichols currently has left sided hemiparesis of the upper and lower extremities. TTE performed as part of strok work up. CD4 count has been ordered by the primary team.   Review of Systems: Review of Systems  Constitutional: Negative for chills, fever and weight loss.  Respiratory: Negative for cough, shortness of breath and wheezing.   Cardiovascular: Negative for chest pain and leg swelling.  Gastrointestinal: Negative for  abdominal pain, constipation, diarrhea, nausea and vomiting.  Skin: Negative for rash.  Neurological: Positive for weakness and headaches.     Past Medical History:  Diagnosis Date  . Asthma   . Eczema   . Enlarged prostate   . HIV (human immunodeficiency virus infection) (HCC)   . Humerus fracture    left   . Inguinal hernia   . Schizophrenia (HCC)   . Sleep apnea    does not wear CPAP ( per spouse )    Social History   Tobacco Use  . Smoking status: Current Every Day Smoker    Packs/day: 1.00    Types: Cigarettes  . Smokeless tobacco: Never Used  Vaping Use  . Vaping Use: Never used  Substance Use Topics  . Alcohol use: Yes    Comment: pt. stated that he had some "hennessey"  . Drug use: Yes    Types: Cocaine, Marijuana    Comment: + Cocaine and +THC    Family History  Problem Relation Age of Onset  . Hypertension Mother   . Congestive Heart Failure Mother     Allergies  Allergen Reactions  . Grass Pollen(K-O-R-T-Swt Vern) Rash    OBJECTIVE: Blood pressure (!) 155/99, pulse 69, temperature 98.1 F (36.7 C), resp. rate 19, SpO2 100 %.  Physical Exam Constitutional:      General: He is not in acute distress.    Appearance: He is well-developed.     Comments: Lying in bed with head of bed elevated; pleasant  Eyes:     Conjunctiva/sclera: Conjunctivae normal.  Cardiovascular:     Rate and Rhythm: Normal rate and regular rhythm.     Heart sounds: Normal heart sounds. No murmur heard. No friction rub. No gallop.   Pulmonary:     Effort: Pulmonary effort is normal. No respiratory distress.     Breath sounds: Normal breath sounds. No wheezing or rales.  Chest:     Chest wall: No tenderness.  Abdominal:     General: Bowel sounds are normal.     Palpations: Abdomen is soft.     Tenderness: There is no abdominal tenderness.  Musculoskeletal:     Cervical back: Neck supple.  Lymphadenopathy:     Cervical: No cervical adenopathy.  Skin:    General:  Skin is warm and dry.     Findings: No rash.  Neurological:     Mental Status: He is alert and oriented to person, place, and time.     Comments: Movement of his fingers otherwise hemiparesis of the left upper and lower extremity.   Psychiatric:        Mood and Affect: Mood normal.     Lab Results Lab Results  Component Value Date   WBC 6.3 05/04/2021   HGB 16.7 05/04/2021   HCT 49.0 05/04/2021   MCV 91.7 05/04/2021   PLT 288 05/04/2021    Lab Results  Component Value Date   CREATININE 0.90 05/04/2021   BUN 5 (L) 05/04/2021   NA 140 05/04/2021   K 4.2 05/04/2021   CL 105  05/04/2021   CO2 23 05/04/2021    Lab Results  Component Value Date   ALT 29 05/04/2021   AST 48 (H) 05/04/2021   ALKPHOS 79 05/04/2021   BILITOT 0.9 05/04/2021     Microbiology: Recent Results (from the past 240 hour(s))  Resp Panel by RT-PCR (Flu A&B, Covid) Nasopharyngeal Swab     Status: None   Collection Time: 05/04/21  6:07 PM   Specimen: Nasopharyngeal Swab; Nasopharyngeal(NP) swabs in vial transport medium  Result Value Ref Range Status   SARS Coronavirus 2 by RT PCR NEGATIVE NEGATIVE Final    Comment: (NOTE) SARS-CoV-2 target nucleic acids are NOT DETECTED.  The SARS-CoV-2 RNA is generally detectable in upper respiratory specimens during the acute phase of infection. The lowest concentration of SARS-CoV-2 viral copies this assay can detect is 138 copies/mL. A negative result does not preclude SARS-Cov-2 infection and should not be used as the sole basis for treatment or other patient management decisions. A negative result may occur with  improper specimen collection/handling, submission of specimen other than nasopharyngeal swab, presence of viral mutation(s) within the areas targeted by this assay, and inadequate number of viral copies(<138 copies/mL). A negative result must be combined with clinical observations, patient history, and epidemiological information. The expected result  is Negative.  Fact Sheet for Patients:  BloggerCourse.com  Fact Sheet for Healthcare Providers:  SeriousBroker.it  This test is no t yet approved or cleared by the [place] FDA and  has been authorized for detection and/or diagnosis of SARS-CoV-2 by FDA under an Emergency Use Authorization (EUA). This EUA will remain  in effect (meaning this test can be used) for the duration of the COVID-19 declaration under Section 564(b)(1) of the Act, 21 U.S.C.section 360bbb-3(b)(1), unless the authorization is terminated  or revoked sooner.       Influenza A by PCR NEGATIVE NEGATIVE Final   Influenza B by PCR NEGATIVE NEGATIVE Final    Comment: (NOTE) The Xpert Xpress SARS-CoV-2/FLU/RSV plus assay is intended as an aid in the diagnosis of influenza from Nasopharyngeal swab specimens and should not be used as a sole basis for treatment. Nasal washings and aspirates are unacceptable for Xpert Xpress SARS-CoV-2/FLU/RSV testing.  Fact Sheet for Patients: BloggerCourse.com  Fact Sheet for Healthcare Providers: SeriousBroker.it  This test is not yet approved or cleared by the [place] FDA and has been authorized for detection and/or diagnosis of SARS-CoV-2 by FDA under an Emergency Use Authorization (EUA). This EUA will remain in effect (meaning this test can be used) for the duration of the COVID-19 declaration under Section 564(b)(1) of the Act, 21 U.S.C. section 360bbb-3(b)(1), unless the authorization is terminated or revoked.  Performed at [facility], [address], [place], [place] [address]      Marcos Eke, NP [facility]  05/05/2021  2:38 PM

## 2021-05-05 NOTE — Progress Notes (Addendum)
STROKE TEAM PROGRESS NOTE    Interval History   No acute events overnight,   He presented with headache and left-sided weakness and numbness which appears to be improving.  MRI scan shows tiny punctate right MCA/ACA watershed territory embolic looking infarcts.  Carotid ultrasound is negative for significant large vessel extracranial stenosis and MR angiogram of the brain shows no intracranial stenosis.  Urine drug screen is positive for marijuana.  2D echo showed normal ejection fraction without cardiac source of embolism.   Pertinent Lab Work and Imaging    05/05/21 MRI Brain WO IV Contrast 1. 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 No associated hemorrhage or significant mass effect. 2. Underlying mild chronic microvascular ischemic disease. 3. 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  05/05/21 MR Angio Head WO Contrast no significant large vessel intracranial stenosis  Echocardiogram Complete normal ejection fraction without cardiac source of embolism.  Slight left atrial dilatation.   Physical Examination  Pleasant middle-aged African-American male not in distress. Constitutional: Calm, appropriate for condition  Cardiovascular: Normal RR Respiratory: No increased WOB   Mental status: Awake alert oriented to time place and person.    Speech: No dysarthria or aphasia. Cranial nerves: Right gaze preference but able to look to the left past midline.  Blinks to threat on the right but not on the left and suspect partial left homonymous hemianopsia. Motor: Left hemiparesis with grade 2-3 out of 5 strength with significant weakness of left grip and intrinsic hand muscles.  Normal strength on the right.                                     Sensory: Diminished left hemibody sensation. Coordination: Impaired on the left and normal on the  right. Reflexes: Depressed on the left and present on the right Gait: Deferred  NIHSS: 7  Assessment and Plan   Marcus Nichols is a 55 y.o. male w/pmh of HIV, schizophrenia, sleep apnea, enlarged prostate, asthma, smoking cigarettes and substance abuse, and eczema who presents with 3-day history of headache with left-sided weakness and numbness and MRI shows punctate patchy nonhemorrhagic infarcts involving the right frontal and parietal lobes in the watershed distribution but intracranial and extracranial vascular imaging is negative for large grade stenosis.  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 At this time,  Stroke labs were completed including Lipid panel w/LDL 47 mg percent and Hemoglobin A1C pending.  -Continue aspirin and Plavix for secondary stroke prevention into 3 weeks and then continue aspirin alone for secondary stroke prevention  -At discharge please place ambulatory referral to neurology for stroke follow up   #Hypertension   has a history of HTN and has been noncompliant with taking his medicines at home.  Recommend permissive hypertension 48 hours post stroke and from there, gradually reduce the blood pressure, avoiding any acute drops. Long term blood pressure goal is < 140/90.   #Hyperlipidemia From a stroke prevention stand point, the LDL goal is < 70.    #Diabetes  Hemoglobin A1C this admission is pending  #Substance abuse-patient counseled to quit smoking cigarettes, marijuana and cocaine       Hospital day # 0  [name], NP  [facility] Nurse Practitioner Patient  seen and discussed with attending physician Dr.   I have personally obtained history,examined this patient, reviewed notes, independently viewed imaging studies, participated in medical decision making and plan of care.ROS completed by me personally  and pertinent positives fully documented  I have made any additions or clarifications directly to the above note. Agree with note above.  He presented with 3-day history of headache left-sided weakness and numbness with MRI showing multi coil tiny punctate right frontal and parietal watershed embolic looking infarcts with vascular imaging did not show any significant stenosis.  Patient counseled to quit smoking cigarettes, marijuana and cocaine abuse.  Recommend aspirin Plavix for 3 weeks followed by aspirin alone and aggressive risk factor modification.  Physical occupational and speech therapy consults.  Discussed with Dr.Ghimire.  Patient may not be compliant with subsequent outpatient follow-up and treatment and she will defer doing TEE and prolonged cardiac monitoring at the present time.  Greater than 50% time during this 35-minute visit was spent in counseling and coordination of care about his strokes and discussion of stroke evaluation treatment and answering questions. Delia Heady, MD Medical Director [facility] Pager: [phone] 05/05/2021 3:11 PM  To contact Stroke Continuity provider, please refer to [email or web address]. After hours, contact General Neurology

## 2021-05-05 NOTE — ED Notes (Signed)
Pt in MRI at this time, pts family at bedside

## 2021-05-05 NOTE — ED Notes (Signed)
Breakfast order placed ?

## 2021-05-05 NOTE — Consult Note (Signed)
NEUROLOGY CONSULTATION NOTE   Date of service: May 05, 2021 Patient Name: Marcus Nichols MRN:  [id number] DOB:  05-Jun-1966 Reason for consult: "Stroke" Requesting Provider: Chotiner, Claudean Severance, MD _ _ _   _ __   _ __ _ _  __ __   _ __   __ _  History of Present Illness  Marcus Nichols is a 55 y.o. male with PMH significant for HIV, schizophrenia, OSA, non compliant with meds and treatment, EtOh use, smoker who presents with left sided numbness and weakness with R sided headaches.  Patient reports that he had an episode of left arm weakness and numbness along with left leg weakness and numbness on Apr 26, 2021.  The episode improved spontaneously but he still had some gait abnormality along with mild clumsiness of his left hand.  He reports that he went to bed at 05/04/2021 at 0200 and woke up at 3 PM and noted that when he attempted to brush his teeth, his left hand was where he incoordinated and it was very difficult to lift it up.  He also noted significant trouble with walking and now had to drag his left leg.  When his symptoms did not improve, he came into the emergency department for further work-up and evaluation.  He does not see a doctor, wife at bedside reports that he has been diagnosed with hypertension.  Also endorses not taking any medications at home.  He smokes cigarettes, drinks alcohol and also smokes marijuana.  He is now willing to take whatever medications is needed to get him better.  MRS: 0 TPA/Thrombectomy: Outside window NIHSS components Score: Comment  1a Level of Conscious 0[x]  1[]  2[]  3[]      1b LOC Questions 0[x]  1[]  2[]       1c LOC Commands 0[x]  1[]  2[]       2 Best Gaze 0[x]  1[]  2[]       3 Visual 0[x]  1[]  2[]  3[]      4 Facial Palsy 0[x]  1[]  2[]  3[]      5a Motor Arm - left 0[]  1[]  2[]  3[x]  4[]  UN[]    5b Motor Arm - Right 0[x]  1[]  2[]  3[]  4[]  UN[]    6a Motor Leg - Left 0[]  1[]  2[x]  3[]  4[]  UN[]    6b Motor Leg - Right 0[x]  1[]  2[]  3[]  4[]  UN[]    7 Limb Ataxia 0[x]   1[]  2[]  3[]  UN[]     8 Sensory 0[]  1[]  2[x]  UN[]      9 Best Language 0[x]  1[]  2[]  3[]      10 Dysarthria 0[x]  1[]  2[]  UN[]      11 Extinct. and Inattention 0[x]  1[]  2[]       TOTAL: 7      ROS   Constitutional Denies weight loss, fever and chills.   HEENT Denies changes in vision and hearing.   Respiratory Endorses some SOB and cough.   CV Denies palpitations and CP   GI Denies abdominal pain, nausea, vomiting and diarrhea.   GU Denies dysuria and urinary frequency.   MSK Denies myalgia and joint pain.  Skin Denies rash and pruritus.  Neurological Endorses mild R headache and syncope.  Psychiatric Denies recent changes in mood. Denies anxiety and depression.   Past History   Past Medical History:  Diagnosis Date  . Asthma   . Eczema   . Enlarged prostate   . HIV (human immunodeficiency virus infection) (HCC)   . Humerus fracture    left   . Inguinal hernia   . Schizophrenia (HCC)   .  Sleep apnea    does not wear CPAP ( per spouse )   Past Surgical History:  Procedure Laterality Date  . MULTIPLE TOOTH EXTRACTIONS    . ORIF HUMERUS FRACTURE Left 03/07/2019   Procedure: OPEN REDUCTION INTERNAL FIXATION (ORIF) HUMERAL SHAFT FRACTURE;  Surgeon: Roby LoftsHaddix, Kevin P, MD;  Location: [facility] OR;  Service: Orthopedics;  Laterality: Left;   Family History  Problem Relation Age of Onset  . Hypertension Mother   . Congestive Heart Failure Mother    Social History   Socioeconomic History  . Marital status: Single    Spouse name: Not on file  . Number of children: Not on file  . Years of education: Not on file  . Highest education level: Not on file  Occupational History  . Not on file  Tobacco Use  . Smoking status: Current Every Day Smoker    Packs/day: 1.00    Types: Cigarettes  . Smokeless tobacco: Never Used  Vaping Use  . Vaping Use: Never used  Substance and Sexual Activity  . Alcohol use: Yes    Comment: pt. stated that he had some "hennessey"  . Drug use: Yes    Types:  Cocaine, Marijuana    Comment: + Cocaine and +THC  . Sexual activity: Not on file  Other Topics Concern  . Not on file  Social History Narrative   ** Merged History Encounter **       Social Determinants of Health   Financial Resource Strain: Not on file  Food Insecurity: Not on file  Transportation Needs: Not on file  Physical Activity: Not on file  Stress: Not on file  Social Connections: Not on file   No Known Allergies  Medications  (Not in a hospital admission)    Vitals   Vitals:   05/04/21 2230 05/04/21 2231 05/04/21 2300 05/05/21 0016  BP: (!) 184/89  (!) 165/89 (!) 156/93  Pulse: 63  64 73  Resp: 20  19 11   Temp:  98 F (36.7 C)    TempSrc:  Oral    SpO2: 100%  100% 100%     There is no height or weight on file to calculate BMI.  Physical Exam   General: Laying comfortably in bed; in no acute distress. HENT: Normal oropharynx and mucosa. Normal external appearance of ears and nose. Neck: Supple, no pain or tenderness  CV: No JVD. No peripheral edema.  Pulmonary: Symmetric Chest rise. Normal respiratory effort.  Abdomen: Soft to touch, non-tender.  Ext: No cyanosis, edema, or deformity  Skin: No rash. Normal palpation of skin.   Musculoskeletal: Normal digits and nails by inspection. No clubbing.   Neurologic Examination  Mental status/Cognition: Alert, oriented to self, place, month and year, good attention.  Speech/language: Fluent, comprehension intact, object naming intact, repetition intact. Cranial nerves:   CN II Pupils equal and reactive to light, no VF deficits   CN III,IV,VI EOM intact, no gaze preference or deviation, no nystagmus    CN V normal sensation in V1, V2, and V3 segments bilaterally    CN VII no asymmetry, no nasolabial fold flattening    CN VIII normal hearing to speech    CN IX & X normal palatal elevation, no uvular deviation    CN XI 5/5 head turn and 5/5 shoulder shrug bilaterally    CN XII midline tongue protrusion     Motor:  Muscle bulk: normal, tone normal,  tremor none Mvmt Root Nerve  Muscle Right Left Comments  SA C5/6 Ax Deltoid 5 1   EF C5/6 Mc Biceps 5 1   EE C6/7/8 Rad Triceps 5    WF C6/7 Med FCR     WE C7/8 PIN ECU     F Ab C8/T1 U ADM/FDI 5 3   HF L1/2/3 Fem Illopsoas 5 4   KE L2/3/4 Fem Quad 5 5   DF L4/5 D Peron Tib Ant 5 2   PF S1/2 Tibial Grc/Sol 5 4    Reflexes:  Right Left Comments  Pectoralis      Biceps (C5/6) 2 2   Brachioradialis (C5/6) 2 2    Triceps (C6/7) 2 2    Patellar (L3/4) 3 3    Achilles (S1) 2+ 2+    Hoffman      Plantar     Jaw jerk    Sensation:  Light touch Decreased in left arm and left leg.   Pin prick    Temperature    Vibration   Proprioception    Coordination/Complex Motor:  - Finger to Nose unable to do with LUE - Heel to shin unable to do with LLE. - Rapid alternating movement unable to do with LUE - Gait: unsafe to assess given weakness.  Labs   CBC:  Recent Labs  Lab 05/04/21 1751 05/04/21 1803  WBC 6.3  --   NEUTROABS 2.1  --   HGB 16.5 16.7  HCT 49.8 49.0  MCV 91.7  --   PLT 288  --     Basic Metabolic Panel:  Lab Results  Component Value Date   NA 140 05/04/2021   K 4.2 05/04/2021   CO2 23 05/04/2021   GLUCOSE 167 (H) 05/04/2021   BUN 5 (L) 05/04/2021   CREATININE 0.90 05/04/2021   CALCIUM 8.3 (L) 05/04/2021   GFRNONAA >60 05/04/2021   GFRAA 54 (L) 03/28/2020   Lipid Panel: No results found for: LDLCALC HgbA1c: No results found for: HGBA1C Urine Drug Screen:     Component Value Date/Time   LABOPIA NONE DETECTED 05/04/2021 2052   COCAINSCRNUR POSITIVE (A) 05/04/2021 2052   LABBENZ NONE DETECTED 05/04/2021 2052   AMPHETMU NONE DETECTED 05/04/2021 2052   THCU POSITIVE (A) 05/04/2021 2052   LABBARB NONE DETECTED 05/04/2021 2052    Alcohol Level     Component Value Date/Time   ETH <10 05/04/2021 0011    CT Head without contrast: CTH was negative for a large hypodensity concerning for a large  territory infarct or hyperdensity concerning for an ICH  MR Angio head without contrast and Carotid Duplex BL: pending  MRI Brain: personally reviewed 1. 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 No associated hemorrhage or significant mass effect. 2. Underlying mild chronic microvascular ischemic disease. 3. 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  Impression   Marcus Nichols is a 54 y.o. male with PMH significant for HIV, schizophrenia, OSA, non compliant with meds and treatment, EtOh use, smoker who presents with left sided numbness and weakness with R sided headaches. Found to have patchy watershed stroke in R frontal and parietal lobe. His neurologic examination is notable for LUE and LLE weakness and numbness.  Primary Diagnosis:  Cerebral infarction due to embolism of  right middle cerebral artery.   Recommendations  Plan:  - Frequent Neuro checks per stroke unit protocol - Recommend Vascular imaging with MRA Angio Head without contrast and US  Carotid doppler - Recommend obtaining TTE with bubble study. - Recommend obtaining Lipid panel with LDL - Please start statin if LDL > 70 - Recommend HbA1c - Antithrombotic - aspirin 81mg  daily along with plavix 75mg  daily - Recommend DVT ppx - SBP goal - permissive hypertension first 24 h < 220/110. Hold home meds.  - Recommend Telemetry monitoring for arrythmia - Recommend bedside swallow screen prior to PO intake. - Stroke education booklet - Recommend PT/OT/SLP consult - Recommend Urine Tox screen. - counseled on the importance of medication compliance and quitting smoking.  ______________________________________________________________________   Thank you for the opportunity to take part in the care of this patient. If you have any further questions, please contact the neurology  consultation attending.  Signed,  [facility] Pager Number _ _ _   _ __   _ __ _ _  __ __   _ __   __ _

## 2021-05-05 NOTE — Progress Notes (Signed)
PROGRESS NOTE        PATIENT DETAILS Name: Marcus Nichols Age: 55 y.o. Sex: male Date of Birth: [birth year] Admit Date: 05/04/2021 Admitting Physician Carlton Adam, MD WUJ:[id number], No Pcp Per (Inactive)  Brief Narrative: Patient is a 55 y.o. male, with history of HTN HIV, schizophrenia, alcohol/cocaine/tobacco use-noncompliant with medications-presented with left-sided weakness-further evaluation revealed acute CVA.  See below for further details.  Significant events: 6/7>> admit-left-sided weakness-found to have CVA.  Significant studies: 6/7>> CXR: No acute findings. 6/8>> MRI brain: Patchy acute infarct involving right frontal/parietal lobes 6/8>> MRI C-spine: No acute abnormality 6/8>> MRI brain: No proximal intracranial stenosis. 6/8>> carotid Doppler: No significant stenosis. 6/8>> LDL: 47  Antimicrobial therapy: None  Microbiology data: 6/7>> influenza/COVID PCR: Negative  Procedures : None  Consults: Neurology  DVT Prophylaxis : SCD's Start: 05/05/21 0326 SQ Lovenox   Subjective: Frustrated that he is still in the emergency room.  Continues to have left-sided deficits.   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  HTN: Allow permissive hypertension-we will start antihypertensives over the next few days.  He has a history of hypertension and has been noncompliant with medications.  HIV: Obtain CD4 count/viral load-patient apparently was diagnosed in the 1990s-and was on ART till at least 2015-following which he became noncompliant.  Patient interested in resuming antiretrovirals-have consulted ID.  Cocaine abuse: Counseled  Alcohol use: No signs of withdrawal symptoms-on CIWA protocol.  Tobacco abuse: We will counsel going  forward.  Diet: Diet Order            Diet Carb Modified Fluid consistency: Thin; Room service appropriate? Yes  Diet effective now                  Code Status: Full code   Family Communication: Significant other at bedside  Disposition Plan: Status is: Inpatient  Remains inpatient appropriate because:Inpatient level of care appropriate due to severity of illness   Dispo: The patient is from: Home              Anticipated d/c is to: tbd              Patient currently is not medically stable to d/c.   Difficult to place patient No   Barriers to Discharge: Acute CVA with significant left-sided weakness-needs inpatient work-up-awaiting rehab services evaluation to determine safe disposition.  Antimicrobial agents: Anti-infectives (From admission, onward)   None       Time spent: 35 minutes-Greater than 50% of this time was spent in counseling, explanation of diagnosis, planning of further management, and coordination of care.  MEDICATIONS: Scheduled Meds: .  stroke: mapping our early stages of recovery book   Does not apply Once  . aspirin EC  81 mg Oral Daily  . atorvastatin  80 mg Oral Daily  . clopidogrel  75 mg Oral Daily  . folic acid  1 mg Oral Daily  . LORazepam  0-4 mg Intravenous Q6H   Followed by  . [START ON 05/07/2021] LORazepam  0-4 mg Intravenous Q12H  . multivitamin with minerals  1 tablet Oral Daily  . sodium chloride flush  3 mL Intravenous Once  . thiamine  100 mg Oral Daily   Or  . thiamine  100 mg  Intravenous Daily   Continuous Infusions: . lactated ringers 100 mL/hr at 05/05/21 0340   PRN Meds:.acetaminophen **OR** acetaminophen (TYLENOL) oral liquid 160 mg/5 mL **OR** acetaminophen, chlordiazePOXIDE, hydrOXYzine, loperamide, LORazepam **OR** LORazepam, ondansetron, senna-docusate   PHYSICAL EXAM: Vital signs: Vitals:   05/05/21 0700 05/05/21 0800 05/05/21 1000 05/05/21 1200  BP: (!) 149/81 (!) 161/94 (!) 166/82 (!) 155/99  Pulse:  65 69 64 69  Resp: (!) 21 (!) Temp: 98.1 F (36.7 C)     TempSrc:      SpO2: 99% 100% 100% 100%   There were no vitals filed for this visit. There is no height or weight on file to calculate BMI.   Gen Exam:Alert awake-not in any distress HEENT:atraumatic, normocephalic Chest: B/L clear to auscultation anteriorly CVS:S1S2 regular Abdomen:soft non tender, non distended Extremities:no edema Neurology: + Left-sided weakness Skin: no rash  I have personally reviewed following labs and imaging studies  LABORATORY DATA: CBC: Recent Labs  Lab 05/04/21 1751 05/04/21 1803  WBC 6.3  --   NEUTROABS 2.1  --   HGB 16.5 16.7  HCT 49.8 49.0  MCV 91.7  --   PLT 288  --     Basic Metabolic Panel: Recent Labs  Lab 05/04/21 1751 05/04/21 1803 05/05/21 0330  NA 136 140  --   K 4.6 4.2  --   CL 106 105  --   CO2 23  --   --   GLUCOSE 161* 167*  --   BUN 7 5*  --   CREATININE 0.96 0.90  --   CALCIUM 8.3*  --   --   MG  --   --  1.9  PHOS  --   --  2.6    GFR: CrCl cannot be calculated (Unknown ideal weight.).  Liver Function Tests: Recent Labs  Lab 05/04/21 1751  AST 48*  ALT 29  ALKPHOS 79  BILITOT 0.9  PROT 7.3  ALBUMIN 3.0*   No results for input(s): LIPASE, AMYLASE in the last 168 hours. No results for input(s): AMMONIA in the last 168 hours.  Coagulation Profile: Recent Labs  Lab 05/04/21 1751  INR 1.1    Cardiac Enzymes: No results for input(s): CKTOTAL, CKMB, CKMBINDEX, TROPONINI in the last 168 hours.  BNP (last 3 results) No results for input(s): PROBNP in the last 8760 hours.  Lipid Profile: Recent Labs    05/05/21 0330  CHOL 108  HDL 41  LDLCALC 47  TRIG 100  CHOLHDL 2.6    Thyroid Function Tests: No results for input(s): TSH, T4TOTAL, FREET4, T3FREE, THYROIDAB in the last 72 hours.  Anemia Panel: No results for input(s): VITAMINB12, FOLATE, FERRITIN, TIBC, IRON, RETICCTPCT in the last 72 hours.  Urine analysis:     Component Value Date/Time   COLORURINE AMBER (A) 05/04/2021 2052   APPEARANCEUR CLEAR 05/04/2021 2052   LABSPEC 1.027 05/04/2021 2052   PHURINE 6.0 05/04/2021 2052   GLUCOSEU NEGATIVE 05/04/2021 2052   HGBUR NEGATIVE 05/04/2021 2052   BILIRUBINUR NEGATIVE 05/04/2021 2052   KETONESUR 5 (A) 05/04/2021 2052   PROTEINUR 30 (A) 05/04/2021 2052   NITRITE NEGATIVE 05/04/2021 2052   LEUKOCYTESUR NEGATIVE 05/04/2021 2052    Sepsis Labs: Lactic Acid, Venous    Component Value Date/Time   LATICACIDVEN 3.5 (HH) 02/27/2019 1925    MICROBIOLOGY: Recent Results (from the past 240 hour(s))  Resp Panel by RT-PCR (Flu A&B, Covid) Nasopharyngeal Swab     Status: None   Collection  Time: 05/04/21  6:07 PM   Specimen: Nasopharyngeal Swab; Nasopharyngeal(NP) swabs in vial transport medium  Result Value Ref Range Status   SARS Coronavirus 2 by RT PCR NEGATIVE NEGATIVE Final    Comment: (NOTE) SARS-CoV-2 target nucleic acids are NOT DETECTED.  The SARS-CoV-2 RNA is generally detectable in upper respiratory specimens during the acute phase of infection. The lowest concentration of SARS-CoV-2 viral copies this assay can detect is 138 copies/mL. A negative result does not preclude SARS-Cov-2 infection and should not be used as the sole basis for treatment or other patient management decisions. A negative result may occur with  improper specimen collection/handling, submission of specimen other than nasopharyngeal swab, presence of viral mutation(s) within the areas targeted by this assay, and inadequate number of viral copies(<138 copies/mL). A negative result must be combined with clinical observations, patient history, and epidemiological information. The expected result is Negative.  Fact Sheet for Patients:  BloggerCourse.comhttps://www.fda.gov/media/152166/download  Fact Sheet for Healthcare Providers:  SeriousBroker.ithttps://www.fda.gov/media/152162/download  This test is no t yet approved or cleared by the [place] FDA and  has been authorized for detection and/or diagnosis of SARS-CoV-2 by FDA under an Emergency Use Authorization (EUA). This EUA will remain  in effect (meaning this test can be used) for the duration of the COVID-19 declaration under Section 564(b)(1) of the Act, 21 U.S.C.section 360bbb-3(b)(1), unless the authorization is terminated  or revoked sooner.       Influenza A by PCR NEGATIVE NEGATIVE Final   Influenza B by PCR NEGATIVE NEGATIVE Final    Comment: (NOTE) The Xpert Xpress SARS-CoV-2/FLU/RSV plus assay is intended as an aid in the diagnosis of influenza from Nasopharyngeal swab specimens and should not be used as a sole basis for treatment. Nasal washings and aspirates are unacceptable for Xpert Xpress SARS-CoV-2/FLU/RSV testing.  Fact Sheet for Patients: BloggerCourse.comhttps://www.fda.gov/media/152166/download  Fact Sheet for Healthcare Providers: SeriousBroker.ithttps://www.fda.gov/media/152162/download  This test is not yet approved or cleared by the [place] FDA and has been authorized for detection and/or diagnosis of SARS-CoV-2 by FDA under an Emergency Use Authorization (EUA). This EUA will remain in effect (meaning this test can be used) for the duration of the COVID-19 declaration under Section 564(b)(1) of the Act, 21 U.S.C. section 360bbb-3(b)(1), unless the authorization is terminated or revoked.  Performed at [facility], [address] [place], [place] [address]     RADIOLOGY STUDIES/RESULTS: CT HEAD WO CONTRAST  Result Date: 05/04/2021 CLINICAL DATA:  Weakness. Left upper extremity decreased movement. Dizziness. Fall onto floor last night. EXAM: CT HEAD WITHOUT CONTRAST CT CERVICAL SPINE WITHOUT CONTRAST TECHNIQUE: Multidetector CT imaging of the head and cervical spine was performed following the standard protocol without intravenous contrast. Multiplanar CT image reconstructions of the cervical spine were also generated. COMPARISON:  03/19/2019 head CT.  FINDINGS: CT HEAD FINDINGS Brain: Nonspecific mild subcortical and periventricular white matter hypodensity, most in keeping with chronic small vessel ischemic change. No evidence of parenchymal hemorrhage or extra-axial fluid collection. No mass lesion, mass effect, or midline shift. No CT evidence of acute infarction. Cerebral volume is age appropriate. No ventriculomegaly. Vascular: No acute abnormality. Skull: No evidence of calvarial fracture. Sinuses/Orbits: Mucoperiosteal thickening throughout the right maxillary sinus, largely new. No fluid levels. Other:  The mastoid air cells are unopacified. CT CERVICAL SPINE FINDINGS Alignment: Straightening of the cervical spine. No facet subluxation. Dens is well positioned between the lateral masses of C1. Skull base and vertebrae: No acute fracture. No primary bone lesion or focal  pathologic process. Soft tissues and spinal canal: No prevertebral edema. No visible canal hematoma. Disc levels: Mild degenerative disc disease at C3-4. no significant facet arthropathy or degenerative foraminal stenosis. Upper chest: No acute abnormality. Other: Visualized mastoid air cells appear clear. No discrete thyroid nodules. No pathologically enlarged cervical nodes. IMPRESSION: 1. No evidence of acute intracranial abnormality. No evidence of calvarial fracture. 2. Mild chronic small vessel ischemic changes in the cerebral white matter. 3. Right maxillary sinusitis, chronic appearing, although new since 2020 head CT. 4. No cervical spine fracture or subluxation. 5. Mild degenerative disc disease at C3-4. Electronically Signed   By: Delbert Phenix M.D.   On: 05/04/2021 19:10   CT CERVICAL SPINE WO CONTRAST  Result Date: 05/04/2021 CLINICAL DATA:  Weakness. Left upper extremity decreased movement. Dizziness. Fall onto floor last night. EXAM: CT HEAD WITHOUT CONTRAST CT CERVICAL SPINE WITHOUT CONTRAST TECHNIQUE: Multidetector CT imaging of the head and cervical spine was performed  following the standard protocol without intravenous contrast. Multiplanar CT image reconstructions of the cervical spine were also generated. COMPARISON:  03/19/2019 head CT. FINDINGS: CT HEAD FINDINGS Brain: Nonspecific mild subcortical and periventricular white matter hypodensity, most in keeping with chronic small vessel ischemic change. No evidence of parenchymal hemorrhage or extra-axial fluid collection. No mass lesion, mass effect, or midline shift. No CT evidence of acute infarction. Cerebral volume is age appropriate. No ventriculomegaly. Vascular: No acute abnormality. Skull: No evidence of calvarial fracture. Sinuses/Orbits: Mucoperiosteal thickening throughout the right maxillary sinus, largely new. No fluid levels. Other:  The mastoid air cells are unopacified. CT CERVICAL SPINE FINDINGS Alignment: Straightening of the cervical spine. No facet subluxation. Dens is well positioned between the lateral masses of C1. Skull base and vertebrae: No acute fracture. No primary bone lesion or focal pathologic process. Soft tissues and spinal canal: No prevertebral edema. No visible canal hematoma. Disc levels: Mild degenerative disc disease at C3-4. no significant facet arthropathy or degenerative foraminal stenosis. Upper chest: No acute abnormality. Other: Visualized mastoid air cells appear clear. No discrete thyroid nodules. No pathologically enlarged cervical nodes. IMPRESSION: 1. No evidence of acute intracranial abnormality. No evidence of calvarial fracture. 2. Mild chronic small vessel ischemic changes in the cerebral white matter. 3. Right maxillary sinusitis, chronic appearing, although new since 2020 head CT. 4. No cervical spine fracture or subluxation. 5. Mild degenerative disc disease at C3-4. Electronically Signed   By: Delbert Phenix M.D.   On: 05/04/2021 19:10   MR ANGIO HEAD WO CONTRAST  Result Date: 05/05/2021 CLINICAL DATA:  Acute infarcts on MRI brain EXAM: MRA HEAD WITHOUT CONTRAST  TECHNIQUE: Angiographic images of the Circle of Willis were acquired using MRA technique without intravenous contrast. COMPARISON:  None FINDINGS: Anterior circulation: Intracranial internal carotid arteries are patent. Anterior and middle cerebral arteries are patent. Posterior circulation: Intracranial vertebral arteries are partially imaged but patent. Basilar artery is patent. Posterior cerebral arteries are patent. Left larger than right posterior communicating arteries are present. Other: None. IMPRESSION: No proximal intracranial vessel occlusion or stenosis. Electronically Signed   By: Guadlupe Spanish M.D.   On: 05/05/2021 12:53   MR Brain W and Wo Contrast  Result Date: 05/05/2021 CLINICAL DATA:  Initial evaluation for acute weakness. EXAM: MRI HEAD WITHOUT AND WITH CONTRAST MRI CERVICAL SPINE WITHOUT AND WITH CONTRAST TECHNIQUE: Multiplanar, multiecho pulse sequences of the brain and surrounding structures, and cervical spine, to include the craniocervical junction and cervicothoracic junction, were obtained without and with intravenous contrast. CONTRAST:  37mL GADAVIST GADOBUTROL 1 MMOL/ML IV SOLN COMPARISON:  Prior CT from 05/04/2021. FINDINGS: MRI HEAD FINDINGS Brain: Cerebral volume within normal limits for age. Scattered patchy T2/FLAIR hyperintensity within the periventricular and deep white matter both cerebral hemispheres most consistent with chronic small vessel ischemic disease, mild in nature. Patchy restricted diffusion seen extending in a somewhat linear oblique fashion across the right frontal and parietal lobes, with both cortical and subcortical involvement (series 5, images 105, 99, 95, 92). Finding consistent with acute ischemic infarcts. Associated patchy post-contrast enhancement likely reflects breakdown of the blood-brain barrier. No significant regional mass effect. No associated hemorrhage. These infarcts are watershed in distribution. No other evidence for acute or subacute  infarct. Gray-white matter differentiation otherwise maintained. No other areas of remote cortical infarction. No other evidence for acute or chronic intracranial hemorrhage. No mass lesion, midline shift or mass effect. No hydrocephalus or extra-axial fluid collection. Pituitary gland suprasellar region normal. Midline structures intact. No other abnormal enhancement. Vascular: Major intracranial vascular flow voids are maintained. 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 Diffusely decreased signal intensity seen within the bone marrow of the upper cervical spine, nonspecific, but most commonly related to anemia, smoking, or obesity. No focal marrow replacing lesion. No scalp soft tissue abnormality. Sinuses/Orbits: Globes and orbital soft tissues within normal limits. Scattered mucosal thickening noted within the ethmoidal air cells and maxillary sinuses. Superimposed small right maxillary sinus retention cyst. No mastoid effusion. Inner ear structures grossly normal. 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 MRI CERVICAL SPINE FINDINGS Alignment: Straightening of the normal cervical lordosis. No listhesis. Vertebrae: Vertebral body height maintained without acute or chronic fracture. Bone marrow signal intensity diffusely decreased on T1 weighted imaging, nonspecific, but most commonly related to anemia, smoking, or obesity. No discrete or worrisome osseous lesions. No abnormal marrow edema or enhancement. Cord: Normal signal and morphology. Posterior Fossa, vertebral arteries, paraspinal tissues: Chiari 1 malformation again noted. Craniocervical junction otherwise unremarkable. Paraspinous and prevertebral soft tissues within normal limits. Normal flow voids seen within the vertebral arteries bilaterally.  Few scattered subcentimeter cystic lesions noted within the parotid glands bilaterally, nonspecific, but suspected to be related history of HIV. Disc levels: C2-C3: Mild disc bulge with uncovertebral hypertrophy. No significant spinal stenosis. Mild right C3 foraminal narrowing. Left neural foramina remains patent. C3-C4: Disc bulge with bilateral uncovertebral hypertrophy. Posterior disc osteophyte flattens and partially effaces the ventral thecal sac. Minimal flattening of the ventral cord without cord signal changes. Mild spinal stenosis. Moderate right with mild to moderate left C4 foraminal stenosis. C4-C5: Mild disc bulge with uncovertebral hypertrophy. Flattening of the ventral thecal sac without significant spinal stenosis. Mild left C5 foraminal stenosis. Right neural foramina remains patent. C5-C6: Mild disc bulge with uncovertebral spurring. Superimposed tiny central disc protrusion minimally indents the ventral thecal sac. No significant spinal stenosis. Mild right C6 foraminal narrowing. Left neural foramina remains patent. C6-C7: Mild disc bulge with right worse than left uncovertebral spurring. No significant spinal stenosis. Moderate right C7 foraminal stenosis. Left neural foramina remains patent. C7-T1: Minimal disc bulge. Mild facet and ligament flavum hypertrophy. No significant spinal stenosis. Foramina remain patent. Visualized upper thoracic spine demonstrates no significant finding. IMPRESSION: MRI HEAD IMPRESSION: 1. 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 No associated  hemorrhage or significant mass effect. 2. Underlying mild chronic microvascular ischemic disease. 3. 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 MRI CERVICAL SPINE IMPRESSION: 1. No acute abnormality within the cervical spine. 2. Mild multilevel cervical spondylosis as above. Mild  spinal stenosis at the C3-4 level. No other significant canal narrowing or evidence for cord impingement. 3. Moderate right C4 and C7 foraminal narrowing related to disc bulge and uncovertebral disease. Electronically Signed   By: Rise Mu M.D.   On: 05/05/2021 04:00   MR Cervical Spine W or Wo Contrast  Result Date: 05/05/2021 CLINICAL DATA:  Initial evaluation for acute weakness. EXAM: MRI HEAD WITHOUT AND WITH CONTRAST MRI CERVICAL SPINE WITHOUT AND WITH CONTRAST TECHNIQUE: Multiplanar, multiecho pulse sequences of the brain and surrounding structures, and cervical spine, to include the craniocervical junction and cervicothoracic junction, were obtained without and with intravenous contrast. CONTRAST:  52mL GADAVIST GADOBUTROL 1 MMOL/ML IV SOLN COMPARISON:  Prior CT from 05/04/2021. FINDINGS: MRI HEAD FINDINGS Brain: Cerebral volume within normal limits for age. Scattered patchy T2/FLAIR hyperintensity within the periventricular and deep white matter both cerebral hemispheres most consistent with chronic small vessel ischemic disease, mild in nature. Patchy restricted diffusion seen extending in a somewhat linear oblique fashion across the right frontal and parietal lobes, with both cortical and subcortical involvement (series 5, images 105, 99, 95, 92). Finding consistent with acute ischemic infarcts. Associated patchy post-contrast enhancement likely reflects breakdown of the blood-brain barrier. No significant regional mass effect. No associated hemorrhage. These infarcts are watershed in distribution. No other evidence for acute or subacute infarct. Gray-white matter differentiation otherwise maintained. No other areas of remote cortical infarction. No other evidence for acute or chronic intracranial hemorrhage. No mass lesion, midline shift or mass effect. No hydrocephalus or extra-axial fluid collection. Pituitary gland suprasellar region normal. Midline structures intact. No other abnormal  enhancement. Vascular: Major intracranial vascular flow voids are maintained. 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 Diffusely decreased signal intensity seen within the bone marrow of the upper cervical spine, nonspecific, but most commonly related to anemia, smoking, or obesity. No focal marrow replacing lesion. No scalp soft tissue abnormality. Sinuses/Orbits: Globes and orbital soft tissues within normal limits. Scattered mucosal thickening noted within the ethmoidal air cells and maxillary sinuses. Superimposed small right maxillary sinus retention cyst. No mastoid effusion. Inner ear structures grossly normal. 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 MRI CERVICAL SPINE FINDINGS Alignment: Straightening of the normal cervical lordosis. No listhesis. Vertebrae: Vertebral body height maintained without acute or chronic fracture. Bone marrow signal intensity diffusely decreased on T1 weighted imaging, nonspecific, but most commonly related to anemia, smoking, or obesity. No discrete or worrisome osseous lesions. No abnormal marrow edema or enhancement. Cord: Normal signal and morphology. Posterior Fossa, vertebral arteries, paraspinal tissues: Chiari 1 malformation again noted. Craniocervical junction otherwise unremarkable. Paraspinous and prevertebral soft tissues within normal limits. Normal flow voids seen within the vertebral arteries bilaterally. Few scattered subcentimeter cystic lesions noted within the parotid glands bilaterally, nonspecific, but suspected to be related history of HIV. Disc levels: C2-C3: Mild disc bulge with uncovertebral hypertrophy. No significant spinal stenosis. Mild right C3 foraminal narrowing. Left neural foramina remains patent. C3-C4: Disc bulge with bilateral  uncovertebral hypertrophy. Posterior disc osteophyte flattens and  partially effaces the ventral thecal sac. Minimal flattening of the ventral cord without cord signal changes. Mild spinal stenosis. Moderate right with mild to moderate left C4 foraminal stenosis. C4-C5: Mild disc bulge with uncovertebral hypertrophy. Flattening of the ventral thecal sac without significant spinal stenosis. Mild left C5 foraminal stenosis. Right neural foramina remains patent. C5-C6: Mild disc bulge with uncovertebral spurring. Superimposed tiny central disc protrusion minimally indents the ventral thecal sac. No significant spinal stenosis. Mild right C6 foraminal narrowing. Left neural foramina remains patent. C6-C7: Mild disc bulge with right worse than left uncovertebral spurring. No significant spinal stenosis. Moderate right C7 foraminal stenosis. Left neural foramina remains patent. C7-T1: Minimal disc bulge. Mild facet and ligament flavum hypertrophy. No significant spinal stenosis. Foramina remain patent. Visualized upper thoracic spine demonstrates no significant finding. IMPRESSION: MRI HEAD IMPRESSION: 1. 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 No associated hemorrhage or significant mass effect. 2. Underlying mild chronic microvascular ischemic disease. 3. 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 MRI CERVICAL SPINE IMPRESSION: 1. No acute abnormality within the cervical spine. 2. Mild multilevel cervical spondylosis as above. Mild spinal stenosis at the C3-4 level. No other significant canal narrowing or evidence for cord impingement. 3. Moderate right C4 and C7 foraminal narrowing related to disc bulge and uncovertebral disease. Electronically Signed   By: Rise Mu M.D.   On: 05/05/2021 04:00   DG Chest Portable 1 View  Result Date: 05/04/2021 CLINICAL DATA:   Shortness of breath. EXAM: PORTABLE CHEST 1 VIEW COMPARISON:  02/27/2019 FINDINGS: The cardiomediastinal contours are normal. Multiple skin folds project over the right hemithorax. Pulmonary vasculature is normal. No consolidation, pleural effusion, or pneumothorax. No acute osseous abnormalities are seen. IMPRESSION: No acute chest findings. Electronically Signed   By: Narda Rutherford M.D.   On: 05/04/2021 23:35   VAS US CAROTID  Result Date: 05/05/2021 Carotid Arterial Duplex Study Patient Name:  JAHSIAH CARPENTER  Date of Exam:   05/05/2021 Medical Rec #: [id number]     Accession #:    [id number] Date of Birth: January 24, 1966     Patient Gender: M Patient Age:   55Y Exam Location:  [facility] Procedure:      VAS US CAROTID Referring Phys: [id number] [facility] --------------------------------------------------------------------------------  Indications:       CVA. Risk Factors:      None. Comparison Study:  No prior study Performing Technologist: Gertie Fey MHA, RDMS, RVT, RDCS  Examination Guidelines: A complete evaluation includes B-mode imaging, spectral Doppler, color Doppler, and power Doppler as needed of all accessible portions of each vessel. Bilateral testing is considered an integral part of a complete examination. Limited examinations for reoccurring indications may be performed as noted.  Right Carotid Findings: +----------+--------+--------+--------+---------------------+------------------+           PSV cm/sEDV cm/sStenosisPlaque Description   Comments           +----------+--------+--------+--------+---------------------+------------------+ CCA Prox  122     14                                                      +----------+--------+--------+--------+---------------------+------------------+ CCA Distal79      16  intimal thickening +----------+--------+--------+--------+---------------------+------------------+ ICA Prox  64       12              smooth and                                                                homogeneous                             +----------+--------+--------+--------+---------------------+------------------+ ICA Distal98      22                                                      +----------+--------+--------+--------+---------------------+------------------+ ECA       94      17                                                      +----------+--------+--------+--------+---------------------+------------------+ +----------+--------+-------+----------------+-------------------+           PSV cm/sEDV cmsDescribe        Arm Pressure (mmHG) +----------+--------+-------+----------------+-------------------+ [id number]            Multiphasic, WNL                    +----------+--------+-------+----------------+-------------------+ +---------+--------+--+--------+--+---------+ VertebralPSV cm/s51EDV cm/s12Antegrade +---------+--------+--+--------+--+---------+  Left Carotid Findings: +----------+--------+--------+--------+---------------------+------------------+           PSV cm/sEDV cm/sStenosisPlaque Description   Comments           +----------+--------+--------+--------+---------------------+------------------+ CCA Prox  108     19                                                      +----------+--------+--------+--------+---------------------+------------------+ CCA Distal89      20                                   intimal thickening +----------+--------+--------+--------+---------------------+------------------+ ICA Prox  40      8               smooth and                                                                homogeneous                             +----------+--------+--------+--------+---------------------+------------------+ ICA Distal81      23                                                       +----------+--------+--------+--------+---------------------+------------------+  ECA       98      16                                                      +----------+--------+--------+--------+---------------------+------------------+ +----------+--------+--------+----------------+-------------------+           PSV cm/sEDV cm/sDescribe        Arm Pressure (mmHG) +----------+--------+--------+----------------+-------------------+ [id number]              Multiphasic, WNL                    +----------+--------+--------+----------------+-------------------+ +---------+--------+--+--------+-+---------+ VertebralPSV cm/s43EDV cm/s7Antegrade +---------+--------+--+--------+-+---------+   Summary: Right Carotid: Velocities in the right ICA are consistent with a 1-39% stenosis. Left Carotid: Velocities in the left ICA are consistent with a 1-39% stenosis. Vertebrals:  Bilateral vertebral arteries demonstrate antegrade flow. Subclavians: Normal flow hemodynamics were seen in bilateral subclavian              arteries. *See table(s) above for measurements and observations.     Preliminary      LOS: 0 days   Jeoffrey Massed, MD  [facility]    To contact the attending provider between 7A-7P or the covering provider during after hours 7P-7A, please log into the web site [email or web address] and access using universal [place] password for that web site. If you do not have the password, please call the hospital operator.  05/05/2021, 1:40 PM

## 2021-05-05 NOTE — Progress Notes (Signed)
PT Cancellation Note  Patient Details Name: Aneesh Faller MRN: [id number] DOB: 02/13/1966   Cancelled Treatment:    Reason Eval/Treat Not Completed: Active bedrest order Will follow up as activity orders updated and as schedule allows.  Cindee Salt, DPT  Acute Rehabilitation Services  Pager: [phone] Office: [phone]    Lehman Prom 05/05/2021, 10:43 AM

## 2021-05-05 NOTE — ED Notes (Signed)
Pt transported to MRI 

## 2021-05-05 NOTE — Progress Notes (Signed)
  Echocardiogram 2D Echocardiogram has been performed.  Marcus Nichols G Marcus Nichols 05/05/2021, 2:12 PM

## 2021-05-05 NOTE — H&P (Signed)
History and Physical    Marcus FaceRandy Rieth BJY:[id number] DOB: 1966/11/16 DOA: 05/04/2021  PCP: Patient, No Pcp Per (Inactive)   Patient coming from: Home  Chief Complaint: left sided weakness, right sided headache  HPI: Marcus Nichols is a 55 y.o. male with medical history significant for HTN, HIV, asthma, schizophrenia, polysubstance abuse, alcohol abuse, OSA. Pt is noncompliant and is not treating any of his medical conditions. He denies having medical problems but wife states he does have medical conditions but refuses to take medication for any.  [name] started having trouble on May 30 which was the anniversary of the death of their daughter. He had lightheadedness and mild left leg weakness but was still able to do his normal activites. Then on Sunday night, 2 days ago, he developed worsening weakness in his left leg that caused him to stumble and be off balance.  He reports he had a mild headache and felt lightheaded when he stood up at that time and he also noticed black dots in his visual field bilaterally.  He states that the symptoms persisted through the night and he went to bed around 2 AM.  When he woke up the next morning around 10 AM he had persistent weakness and some numbness of his left leg.  He was able to walk to the couch in the living room by holding onto furniture in the wall and was dragging his left leg according to his wife.  He stated he continued to have black dots in his visual field.  He did not have any slurring of speech or drooping of face.  He was very off balance and will walk into the walls or furniture according to the wife.  Wife also reports he has had some personality change and becoming angry very quickly and having outbursts that have become more frequent over the last few days.  He did not take a nap on the couch and a few hours later his wife heard a loud bang in the living room.  She went in there and he had fallen off the couch when he tried to stand up and he was  not able to use his left leg at all or walk.  He reports that his left side progressively got weaker including his left arm.  He reports a retro-orbital headache behind his right eye that he states is pressure-like.  He states his vision has returned to normal. He has not had any nausea or vomiting.  He has not had any fever.  There was no seizure activity according to the wife.  He did not have any injury or trauma to his head prior to symptoms beginning the other day.  States he did develop some chest pain yesterday which is now resolved.  He states it was a pressure type sensation below his breastbone and it did not radiate.  He did think he had some shortness of breath with it but he was not nauseous or any did not have any vomiting or clammy skin. Wife reports that he drinks a large volume of alcohol daily.  She states he consumes at least 1/5 of vodka a day as well as beers.  Last drink last night.  She also reports that he does smoke marijuana and she believes he uses cocaine although he denies it.  He continues to smoke 1-1 1/2 packs of cigarettes a day.  He states that he does develop shaking and tremors when he does not drink for a day or  2 he reports he has never had DTs. He has not had any fever or cough.  ED Course:  [name] has BP of 150-185/80-105 in the ER. Normal heart rate. Oxygen saturation 99-100% on RA.  Labs reveal a WBC of 6300, hemoglobin 16.5, hematocrit 41.8, platelets 208,000 sodium 136, potassium 4.6, chloride 106, bicarb 23, glucose 161, creatinine 0.96, BUN 7, calcium 8.3 with ionized calcium of 1.02, albumin 3.0, alkaline phosphatase 79, AST 48, ALT 29, bilirubin 0.9.  INR 1.1.  Alcohol level is negative.  UDS positive for cocaine and THC.  Urinalysis negative.  Coronavirus and influenza swab negative.  CT of the head no acute intracranial abnormality, there is mild chronic small vessel ischemic changes in the white matter.  CT of the neck is negative for fracture or  subluxation.  Neurology has been consulted and will see patient in the emergency room and make further recommendations. Hospitalist services been asked to admit for further management  Review of Systems:  General: Denies fever, chills, weight loss, night sweats  Denies change in appetite HENT: Denies head trauma, denies change in hearing, tinnitus. Denies nasal congestion or bleeding.  Denies sore throat.  Denies difficulty swallowing Eyes: Has black spots (floaters) in visual field. Denies blurry vision, pain in eye, drainage.  Denies discoloration of eyes. Neck: Denies pain.  Denies swelling.  Denies pain with movement. Cardiovascular: Reports chest pain Denies palpitations.  Denies edema.  Denies orthopnea Respiratory: Reports shortness of breath. Denies cough.  Denies wheezing.  Denies sputum production Gastrointestinal: Denies abdominal pain, swelling.  Denies nausea, vomiting, diarrhea.  Denies melena.  Denies hematemesis. Musculoskeletal: Denies limitation of movement.  Denies deformity or swelling. Denies arthralgias or myalgias. Genitourinary: Denies pelvic pain.  Denies urinary frequency or hesitancy.  Denies dysuria.  Skin: Denies rash.  Denies petechiae, purpura, ecchymosis. Neurological: Denies syncope.  Denies seizure activity. Denies slurred speech, drooping face. Denies visual change. Psychiatric: Denies depression, anxiety.   Past Medical History:  Diagnosis Date  . Asthma   . Eczema   . Enlarged prostate   . HIV (human immunodeficiency virus infection) (HCC)   . Humerus fracture    left   . Inguinal hernia   . Schizophrenia (HCC)   . Sleep apnea    does not wear CPAP ( per spouse )    Past Surgical History:  Procedure Laterality Date  . MULTIPLE TOOTH EXTRACTIONS    . ORIF HUMERUS FRACTURE Left 03/07/2019   Procedure: OPEN REDUCTION INTERNAL FIXATION (ORIF) HUMERAL SHAFT FRACTURE;  Surgeon: Roby Lofts, MD;  Location: [facility] OR;  Service: Orthopedics;  Laterality:  Left;    Social History  reports that he has been smoking cigarettes. He has been smoking about 1.00 pack per day. He has never used smokeless tobacco. He reports current alcohol use. He reports current drug use. Drugs: Cocaine and Marijuana.  No Known Allergies  Family History  Problem Relation Age of Onset  . Hypertension Mother   . Congestive Heart Failure Mother      Prior to Admission medications   Medication Sig Start Date End Date Taking? Authorizing Provider  aspirin EC 81 MG tablet Take 81 mg by mouth daily.    [provider]  hydrOXYzine (ATARAX/VISTARIL) 25 MG tablet Take 1 tablet (25 mg total) by mouth every 8 (eight) hours as needed for itching. 12/07/20   LampteyBritta Mccreedy, MD  Vitamin D, Cholecalciferol, 10 MCG (400 UNIT) TABS Take 400 Units by mouth daily.  [provider]  amLODipine (NORVASC) 5 MG tablet Take 1 tablet (5 mg total) by mouth daily. Patient not taking: Reported on 03/28/2020 03/20/19 12/07/20  Rai, Delene Ruffini, MD  gabapentin (NEURONTIN) 300 MG capsule Take 2 capsules (600 mg total) by mouth 2 (two) times daily. Patient not taking: Reported on 03/28/2020 03/20/19 12/07/20  Rai, Delene Ruffini, MD  losartan (COZAAR) 100 MG tablet Take 1 tablet (100 mg total) by mouth daily. Patient not taking: Reported on 03/28/2020 03/21/19 12/07/20  Cathren Harsh, MD    Physical Exam: Vitals:   05/04/21 2230 05/04/21 2231 05/04/21 2300 05/05/21 0016  BP: (!) 184/89  (!) 165/89 (!) 156/93  Pulse: 63  64 73  Resp: 20  19 11   Temp:  98 F (36.7 C)    TempSrc:  Oral    SpO2: 100%  100% 100%    Constitutional: NAD, calm, comfortable Vitals:   05/04/21 2230 05/04/21 2231 05/04/21 2300 05/05/21 0016  BP: (!) 184/89  (!) 165/89 (!) 156/93  Pulse: 63  64 73  Resp: 20  19 11   Temp:  98 F (36.7 C)    TempSrc:  Oral    SpO2: 100%  100% 100%   General: WDWN, Alert and oriented x3.  Eyes: EOMI, PERRL, conjunctivae normal.  Sclera nonicteric HENT:  [place]/AT,  external ears normal. Nares patent without epistasis. Mucous membranes are moist.  Neck: Soft, normal range of motion, supple, no masses, Trachea midline Respiratory: Equal breath sounds with diffuse rales, no wheezing, no crackles. Normal respiratory effort. No accessory muscle use.  Cardiovascular: Regular rate and rhythm, no murmurs / rubs / gallops. No extremity edema. 2+ pedal pulses. Abdomen: Soft, no tenderness, nondistended, no rebound or guarding.  No masses palpated. Bowel sounds normoactive Musculoskeletal: FROM. Has clubbing of digits. No cyanosis. No joint deformity upper and lower extremities. Normal muscle tone.  Skin: Warm, dry, intact no rashes, lesions, ulcers. No induration Neurologic: CN 2-12 grossly intact. Normal speech. Decreased sensation to light touch of left leg and arm. Strength 5/5 in right upper and lower extremities. Strength 0/5 in left upper and lower extremities. No tremor.  Unable to resist gravity with left arm or leg.   Psychiatric: Normal judgment and insight.  Normal mood.    Labs on Admission: I have personally reviewed following labs and imaging studies  CBC: Recent Labs  Lab 05/04/21 1751 05/04/21 1803  WBC 6.3  --   NEUTROABS 2.1  --   HGB 16.5 16.7  HCT 49.8 49.0  MCV 91.7  --   PLT 288  --     Basic Metabolic Panel: Recent Labs  Lab 05/04/21 1751 05/04/21 1803  NA 136 140  K 4.6 4.2  CL 106 105  CO2 23  --   GLUCOSE 161* 167*  BUN 7 5*  CREATININE 0.96 0.90  CALCIUM 8.3*  --     GFR: CrCl cannot be calculated (Unknown ideal weight.).  Liver Function Tests: Recent Labs  Lab 05/04/21 1751  AST 48*  ALT 29  ALKPHOS 79  BILITOT 0.9  PROT 7.3  ALBUMIN 3.0*    Urine analysis:    Component Value Date/Time   COLORURINE AMBER (A) 05/04/2021 2052   APPEARANCEUR CLEAR 05/04/2021 2052   LABSPEC 1.027 05/04/2021 2052   PHURINE 6.0 05/04/2021 2052   GLUCOSEU NEGATIVE 05/04/2021 2052   HGBUR NEGATIVE 05/04/2021 2052    BILIRUBINUR NEGATIVE 05/04/2021 2052   KETONESUR 5 (A) 05/04/2021 2052   PROTEINUR 30 (  A) 05/04/2021 2052   NITRITE NEGATIVE 05/04/2021 2052   LEUKOCYTESUR NEGATIVE 05/04/2021 2052    Radiological Exams on Admission: CT HEAD WO CONTRAST  Result Date: 05/04/2021 CLINICAL DATA:  Weakness. Left upper extremity decreased movement. Dizziness. Fall onto floor last night. EXAM: CT HEAD WITHOUT CONTRAST CT CERVICAL SPINE WITHOUT CONTRAST TECHNIQUE: Multidetector CT imaging of the head and cervical spine was performed following the standard protocol without intravenous contrast. Multiplanar CT image reconstructions of the cervical spine were also generated. COMPARISON:  03/19/2019 head CT. FINDINGS: CT HEAD FINDINGS Brain: Nonspecific mild subcortical and periventricular white matter hypodensity, most in keeping with chronic small vessel ischemic change. No evidence of parenchymal hemorrhage or extra-axial fluid collection. No mass lesion, mass effect, or midline shift. No CT evidence of acute infarction. Cerebral volume is age appropriate. No ventriculomegaly. Vascular: No acute abnormality. Skull: No evidence of calvarial fracture. Sinuses/Orbits: Mucoperiosteal thickening throughout the right maxillary sinus, largely new. No fluid levels. Other:  The mastoid air cells are unopacified. CT CERVICAL SPINE FINDINGS Alignment: Straightening of the cervical spine. No facet subluxation. Dens is well positioned between the lateral masses of C1. Skull base and vertebrae: No acute fracture. No primary bone lesion or focal pathologic process. Soft tissues and spinal canal: No prevertebral edema. No visible canal hematoma. Disc levels: Mild degenerative disc disease at C3-4. no significant facet arthropathy or degenerative foraminal stenosis. Upper chest: No acute abnormality. Other: Visualized mastoid air cells appear clear. No discrete thyroid nodules. No pathologically enlarged cervical nodes. IMPRESSION: 1. No evidence  of acute intracranial abnormality. No evidence of calvarial fracture. 2. Mild chronic small vessel ischemic changes in the cerebral white matter. 3. Right maxillary sinusitis, chronic appearing, although new since 2020 head CT. 4. No cervical spine fracture or subluxation. 5. Mild degenerative disc disease at C3-4. Electronically Signed   By: Delbert Phenix M.D.   On: 05/04/2021 19:10   CT CERVICAL SPINE WO CONTRAST  Result Date: 05/04/2021 CLINICAL DATA:  Weakness. Left upper extremity decreased movement. Dizziness. Fall onto floor last night. EXAM: CT HEAD WITHOUT CONTRAST CT CERVICAL SPINE WITHOUT CONTRAST TECHNIQUE: Multidetector CT imaging of the head and cervical spine was performed following the standard protocol without intravenous contrast. Multiplanar CT image reconstructions of the cervical spine were also generated. COMPARISON:  03/19/2019 head CT. FINDINGS: CT HEAD FINDINGS Brain: Nonspecific mild subcortical and periventricular white matter hypodensity, most in keeping with chronic small vessel ischemic change. No evidence of parenchymal hemorrhage or extra-axial fluid collection. No mass lesion, mass effect, or midline shift. No CT evidence of acute infarction. Cerebral volume is age appropriate. No ventriculomegaly. Vascular: No acute abnormality. Skull: No evidence of calvarial fracture. Sinuses/Orbits: Mucoperiosteal thickening throughout the right maxillary sinus, largely new. No fluid levels. Other:  The mastoid air cells are unopacified. CT CERVICAL SPINE FINDINGS Alignment: Straightening of the cervical spine. No facet subluxation. Dens is well positioned between the lateral masses of C1. Skull base and vertebrae: No acute fracture. No primary bone lesion or focal pathologic process. Soft tissues and spinal canal: No prevertebral edema. No visible canal hematoma. Disc levels: Mild degenerative disc disease at C3-4. no significant facet arthropathy or degenerative foraminal stenosis. Upper  chest: No acute abnormality. Other: Visualized mastoid air cells appear clear. No discrete thyroid nodules. No pathologically enlarged cervical nodes. IMPRESSION: 1. No evidence of acute intracranial abnormality. No evidence of calvarial fracture. 2. Mild chronic small vessel ischemic changes in the cerebral white matter. 3. Right maxillary sinusitis, chronic appearing, although  new since 2020 head CT. 4. No cervical spine fracture or subluxation. 5. Mild degenerative disc disease at C3-4. Electronically Signed   By: Delbert Phenix M.D.   On: 05/04/2021 19:10   DG Chest Portable 1 View  Result Date: 05/04/2021 CLINICAL DATA:  Shortness of breath. EXAM: PORTABLE CHEST 1 VIEW COMPARISON:  02/27/2019 FINDINGS: The cardiomediastinal contours are normal. Multiple skin folds project over the right hemithorax. Pulmonary vasculature is normal. No consolidation, pleural effusion, or pneumothorax. No acute osseous abnormalities are seen. IMPRESSION: No acute chest findings. Electronically Signed   By: Narda Rutherford M.D.   On: 05/04/2021 23:35    EKG: Independently reviewed.  EKG shows normal sinus rhythm with no acute ST elevation or depression.  QTc 447  Assessment/Plan Principal Problem:   Hypertensive urgency Mr Daffin admitted to medical telemetry. Monitor BP.  Will allow permissive HTN of 220/110 until neurology evaluation with CVA on MRI. Will await recommendation from neurology. Pt will need to have education on importance of controlling his BP to decrease health risks of CV disease/CVA.      CVA Neurology consulted.  Aspirin daily.  Start lipitor. Check Lipid panel.   Active Problems:   Acute left-sided weakness Neurology consulted and awaiting MRI brain and MRI neck results.  Consult PT/OT in am    Chest pain   Paresthesia   Alcohol abuse Placed on CIWA protocol and started on librium and ativan provided for elevated CIWA score.     HIV (human immunodeficiency virus infection) Pt has  not taken HARRT for past 4 years and has had no lab work to monitor.  Will need ID consult to establish care and initiate therapy as indicated by ID.     Tobacco use Nicotine patch to prevent withdrawal. Smoking cessation education before discharge.     Polysubstance abuse  Social work consult to assist with finding rehab program.     Right-sided headache Tylenol as needed     Schizophrenia  Will need follow up with psychiatry to establish care and treatment      DVT prophylaxis: SCD's for DVT prophylaxis until CVA can be ruled out.  Code Status:   Full Code  Family Communication:  Diagnosis and plan discussed with pt and his wife. Further recommendations to follow as clinically indicated.  Disposition Plan:   Patient is from:  Home  Anticipated DC to:  To be determined. If weakness and inability to walk persists may need      rehab.   Anticipated DC date:  Anticipate more than two midnight stay in hospital  Consults called:  Neurolgy consulted by ER provider and will follow Admission status:  Inpatient   Claudean Severance Yaron Grasse MD [facility]  How to contact the [facility] Attending or Consulting provider 7A - 7P or covering provider during after hours 7P -7A, for this patient?   1. Check the care team in [facility] and look for a) attending/consulting [facility] provider listed and b) the [facility] team listed 2. Log into [email or web address] and use [facility] universal password to access. If you do not have the password, please contact the hospital operator. 3. Locate the [facility] provider you are looking for under [facility] and page to a number that you can be directly reached. 4. If you still have difficulty reaching the provider, please page the [facility] (Director on Call) for the Hospitalists listed on amion for assistance.  05/05/2021, 3:17 AM

## 2021-05-05 NOTE — Progress Notes (Signed)
Carotid artery duplex completed. Refer to "CV Proc" under chart review to view preliminary results.  05/05/2021 1:09 PM Eula Fried., MHA, RVT, RDCS, RDMS

## 2021-05-06 ENCOUNTER — Telehealth: Payer: Self-pay

## 2021-05-06 ENCOUNTER — Other Ambulatory Visit (HOSPITAL_COMMUNITY): Payer: Self-pay

## 2021-05-06 DIAGNOSIS — F1020 Alcohol dependence, uncomplicated: Secondary | ICD-10-CM

## 2021-05-06 LAB — T-HELPER CELLS (CD4) COUNT (NOT AT ARMC)
CD4 % Helper T Cell: 14 % — ABNORMAL LOW (ref 33–65)
CD4 T Cell Abs: 272 /uL — ABNORMAL LOW (ref 400–1790)

## 2021-05-06 LAB — HEMOGLOBIN A1C

## 2021-05-06 LAB — RPR: RPR Ser Ql: NONREACTIVE

## 2021-05-06 MED ORDER — BICTEGRAVIR-EMTRICITAB-TENOFOV 50-200-25 MG PO TABS
1.0000 | ORAL_TABLET | Freq: Every day | ORAL | Status: DC
  Administered 2021-05-06: 1 via ORAL
  Filled 2021-05-06: qty 1

## 2021-05-06 MED ORDER — CLOPIDOGREL BISULFATE 75 MG PO TABS
75.0000 mg | ORAL_TABLET | Freq: Every day | ORAL | 0 refills | Status: DC
  Filled 2021-05-06: qty 30, 30d supply, fill #0
  Filled 2021-06-04: qty 30, 30d supply, fill #1
  Filled 2021-06-07: qty 30, 30d supply, fill #0

## 2021-05-06 MED ORDER — AMLODIPINE BESYLATE 5 MG PO TABS
5.0000 mg | ORAL_TABLET | Freq: Every day | ORAL | 0 refills | Status: DC
  Filled 2021-05-06: qty 30, 30d supply, fill #0
  Filled 2021-06-04: qty 30, 30d supply, fill #1
  Filled 2021-06-07: qty 30, 30d supply, fill #0
  Filled 2021-07-08: qty 30, 30d supply, fill #1

## 2021-05-06 MED ORDER — BICTEGRAVIR-EMTRICITAB-TENOFOV 50-200-25 MG PO TABS
1.0000 | ORAL_TABLET | Freq: Every day | ORAL | 0 refills | Status: DC
  Filled 2021-05-06: qty 30, 30d supply, fill #0

## 2021-05-06 MED ORDER — ASPIRIN 81 MG PO TBEC
81.0000 mg | DELAYED_RELEASE_TABLET | Freq: Every day | ORAL | 0 refills | Status: DC
  Filled 2021-05-06: qty 90, 90d supply, fill #0

## 2021-05-06 MED ORDER — ATORVASTATIN CALCIUM 80 MG PO TABS
80.0000 mg | ORAL_TABLET | Freq: Every day | ORAL | 0 refills | Status: DC
  Filled 2021-05-06: qty 30, 30d supply, fill #0
  Filled 2021-06-04: qty 30, 30d supply, fill #1
  Filled 2021-06-07: qty 30, 30d supply, fill #0
  Filled 2021-07-08: qty 30, 30d supply, fill #1

## 2021-05-06 NOTE — Plan of Care (Signed)
  Problem: Education: Goal: Knowledge of General Education information will improve Description: Including pain rating scale, medication(s)/side effects and non-pharmacologic comfort measures Outcome: Progressing   Problem: Health Behavior/Discharge Planning: Goal: Ability to manage health-related needs will improve Outcome: Progressing   Problem: Clinical Measurements: Goal: Ability to maintain clinical measurements within normal limits will improve Outcome: Progressing Goal: Will remain free from infection Outcome: Progressing Goal: Diagnostic test results will improve Outcome: Progressing Goal: Respiratory complications will improve Outcome: Progressing Goal: Cardiovascular complication will be avoided Outcome: Progressing   Problem: Activity: Goal: Risk for activity intolerance will decrease Outcome: Progressing   Problem: Nutrition: Goal: Adequate nutrition will be maintained Outcome: Progressing   Problem: Coping: Goal: Level of anxiety will decrease Outcome: Progressing   Problem: Elimination: Goal: Will not experience complications related to bowel motility Outcome: Progressing Goal: Will not experience complications related to urinary retention Outcome: Progressing   Problem: Pain Managment: Goal: General experience of comfort will improve Outcome: Progressing   Problem: Safety: Goal: Ability to remain free from injury will improve Outcome: Progressing   Problem: Skin Integrity: Goal: Risk for impaired skin integrity will decrease Outcome: Progressing   Problem: Consults Goal: RH STROKE PATIENT EDUCATION Description: See Patient Education module for education specifics  Outcome: Progressing Goal: Nutrition Consult-if indicated Outcome: Progressing Goal: Diabetes Guidelines if Diabetic/Glucose > 140 Description: If diabetic or lab glucose is > 140 mg/dl - Initiate Diabetes/Hyperglycemia Guidelines & Document Interventions  Outcome: Progressing    Problem: RH BOWEL ELIMINATION Goal: RH STG MANAGE BOWEL WITH ASSISTANCE Description: STG Manage Bowel with Assistance. Outcome: Progressing Goal: RH STG MANAGE BOWEL W/MEDICATION W/ASSISTANCE Description: STG Manage Bowel with Medication with Assistance. Outcome: Progressing   Problem: RH BLADDER ELIMINATION Goal: RH STG MANAGE BLADDER WITH ASSISTANCE Description: STG Manage Bladder With Assistance Outcome: Progressing Goal: RH STG MANAGE BLADDER WITH MEDICATION WITH ASSISTANCE Description: STG Manage Bladder With Medication With Assistance. Outcome: Progressing Goal: RH STG MANAGE BLADDER WITH EQUIPMENT WITH ASSISTANCE Description: STG Manage Bladder With Equipment With Assistance Outcome: Progressing   Problem: RH SKIN INTEGRITY Goal: RH STG SKIN FREE OF INFECTION/BREAKDOWN Outcome: Progressing Goal: RH STG MAINTAIN SKIN INTEGRITY WITH ASSISTANCE Description: STG Maintain Skin Integrity With Assistance. Outcome: Progressing Goal: RH STG ABLE TO PERFORM INCISION/WOUND CARE W/ASSISTANCE Description: STG Able To Perform Incision/Wound Care With Assistance. Outcome: Progressing   Problem: RH COGNITION-NURSING Goal: RH STG USES MEMORY AIDS/STRATEGIES W/ASSIST TO PROBLEM SOLVE Description: STG Uses Memory Aids/Strategies With Assistance to Problem Solve. Outcome: Progressing Goal: RH STG ANTICIPATES NEEDS/CALLS FOR ASSIST W/ASSIST/CUES Description: STG Anticipates Needs/Calls for Assist With Assistance/Cues. Outcome: Progressing   Problem: RH KNOWLEDGE DEFICIT Goal: RH STG INCREASE KNOWLEDGE OF DIABETES Outcome: Progressing Goal: RH STG INCREASE KNOWLEDGE OF HYPERTENSION Outcome: Progressing Goal: RH STG INCREASE KNOWLEDGE OF DYSPHAGIA/FLUID INTAKE Outcome: Progressing Goal: RH STG INCREASE KNOWLEGDE OF HYPERLIPIDEMIA Outcome: Progressing Goal: RH STG INCREASE KNOWLEDGE OF STROKE PROPHYLAXIS Outcome: Progressing

## 2021-05-06 NOTE — Progress Notes (Signed)
ID Pharmacy Note   Patient has been approved for a 30-day supply of Biktarvy through [facility]. Billing information as follows:  BIN: [id number] PCN: [id number] Group: [id number] Member: [id number]  Sharin Mons, PharmD, BCPS, BCIDP Infectious Diseases Clinical Pharmacist Phone: [phone] 05/06/2021

## 2021-05-06 NOTE — Progress Notes (Signed)
STROKE TEAM PROGRESS NOTE    Interval History   No acute events overnight, patient states that he is noticed improvement in his weakness.  Transthoracic echo showed ejection fraction of 60 to 65% without cardiac source of embolism.  Urine drug screen is positive for cocaine and marijuana. Physical therapist recommend no therapy needs.     Pertinent Lab Work and Imaging    05/05/21 MRI Brain WO IV Contrast 1. 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 No associated hemorrhage or significant mass effect. 2. Underlying mild chronic microvascular ischemic disease. 3. 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  05/05/21 MR Angio Head WO Contrast no significant large vessel intracranial stenosis  Echocardiogram Complete normal ejection fraction without cardiac source of embolism.  Slight left atrial dilatation. 2D echo: Left ventricular ejection fraction of 60 to 65%.  Left atrium mild to moderately dilated. Carotid ultrasound no significant bilateral extracranial stenosis Physical Examination  Pleasant middle-aged African-American male not in distress. Constitutional: Calm, appropriate for condition  Cardiovascular: Normal RR Respiratory: No increased WOB   Mental status: Awake alert oriented to time place and person.    Speech: No dysarthria or aphasia. Cranial nerves: Right gaze preference but able to look to the left past midline.  Blinks to threat on the right but not on the left and suspect partial left homonymous hemianopsia. Motor: Left hemiparesis with grade 2-3 out of 5 strength with significant weakness of left grip and intrinsic hand muscles.  Normal strength on the right.                                     Sensory: Diminished left hemibody sensation. Coordination: Impaired on the left and normal on the right. Reflexes: Depressed on the  left and present on the right Gait: Deferred  NIHSS: 7  Assessment and Plan   [name] is a 55 y.o. male w/pmh of HIV, schizophrenia, sleep apnea, enlarged prostate, asthma, smoking cigarettes and substance abuse, and eczema who presents with 3-day history of headache with left-sided weakness and numbness and MRI shows punctate patchy nonhemorrhagic infarcts involving the right frontal and parietal lobes in the watershed distribution but intracranial and extracranial vascular imaging is negative for large grade stenosis.  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 At this time,  Stroke labs were completed including Lipid panel w/LDL 47 mg percent and Hemoglobin A1C pending.  -Continue aspirin and Plavix for secondary stroke prevention into 3 weeks and then continue aspirin alone for secondary stroke prevention  -At discharge please place ambulatory referral to neurology for stroke follow up   #Hypertension   has a history of HTN and has been noncompliant with taking his medicines at home.  Recommend permissive hypertension 48 hours post stroke and from there, gradually reduce the blood pressure, avoiding any acute drops. Long term blood pressure goal is < 140/90.   #Hyperlipidemia From a stroke prevention stand point, the LDL goal is < 70.    #Diabetes  Hemoglobin A1C this admission is pending  #Substance abuse-patient counseled to quit smoking cigarettes, marijuana and cocaine       Hospital day # 1    He presented with 3-day history of headache left-sided weakness and numbness with  MRI showing multi coil tiny punctate right frontal and parietal watershed embolic looking infarcts with vascular imaging did not show any significant stenosis.  Patient counseled to quit smoking cigarettes, marijuana and cocaine abuse.  Recommend aspirin Plavix for 3 weeks  followed by aspirin alone and aggressive risk factor modification.   Patient may be discharged home discussed with Dr.Ghimire.  Patient may not be compliant with subsequent outpatient follow-up and treatment and she will defer doing TEE and prolonged cardiac monitoring at the present time.  Greater than 50% time during this 25-minute visit was spent in counseling and coordination of care about his strokes and discussion of stroke evaluation treatment and answering questions. Delia Heady, MD Medical Director [facility] Pager: [phone] 05/06/2021 12:44 PM  To contact Stroke Continuity provider, please refer to [email or web address]. After hours, contact General Neurology
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
## 2021-05-06 NOTE — Telephone Encounter (Signed)
[facility] Patient Advocate Encounter   Was successful in obtaining a [place] copay card for [employer].  This copay card will make the patients copay 0.00.     RxBin: [id number] PCN: [id number] Member ID: [id number] Group ID: [id number]  Clearance Coots, CPhT Specialty Pharmacy Patient [facility] Phone: [phone] Fax:  [phone]
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
## 2021-05-06 NOTE — Discharge Summary (Signed)
PATIENT DETAILS Name: Marcus Nichols Age: 55 y.o. Sex: male Date of Birth: [birth year] MRN: [id number]. Admitting Physician: Carlton Adam, MD WUJ:[id number], No Pcp Per (Inactive)  Admit Date: 05/04/2021 Discharge date: 05/06/2021  Recommendations for Outpatient Follow-up:  Follow up with PCP in 1-2 weeks Please obtain CMP/CBC in one week Please ensure follow-up at the stroke clinic, ID clinic Please continue ongoing counseling regarding importance of abstaining from cocaine, tobacco and alcohol use. A1c pending-please follow CD4 count/HIV viral load pending-please follow Aspirin/Plavix x3 weeks followed by aspirin alone  Admitted From:  Home  Disposition: Home   Home Health: No  Equipment/Devices: None  Discharge Condition: Stable  CODE STATUS: FULL CODE  Diet recommendation:  Diet Order             Diet - low sodium heart healthy           Diet Carb Modified Fluid consistency: Thin; Room service appropriate? Yes  Diet effective now                    Brief Summary: Patient is a 55 y.o. male, with history of HTN HIV, schizophrenia, alcohol/cocaine/tobacco use-noncompliant with medications-presented with left-sided weakness-further evaluation revealed acute CVA.  See below for further details.   Significant events: 6/7>> admit-left-sided weakness-found to have CVA.   Significant studies: 6/7>> CXR: No acute findings. 6/8>> MRI brain: Patchy acute infarct involving right frontal/parietal lobes 6/8>> MRI C-spine: No acute abnormality 6/8>> MRI brain: No proximal intracranial stenosis. 6/8>> carotid Doppler: No significant stenosis. 6/8>> Echo: EF 60-65%, bubble study negative. 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 fib-A1c still pending but does not appear to have overt diabetes.  Recommendations from stroke MD are to continue plan, Plavix for 3 weeks-followed by Plavix alone.  Continues to have left-sided deficits-await echo/A1c-EKG with sinus rhythm-continue telemetry monitoring-remains on aspirin/Plavix-await further work-up and input from stroke MD.   HTN: Initially permissive hypertension was allowed-we will start amlodipine on discharge.  Further optimization can be done in the outpatient setting.  Per history-patient has a history of hypertension-and unfortunately has been noncompliant with medications for the past several years.   HIV: Diagnosed in the 1990s-apparently was on ART till 2015-following which he stopped taking his medications-and has not followed up with infectious disease clinic.  ID was consulted-plans are to start him on Biktarvy-ID will arrange for outpatient follow-up.  CD4 count/viral load pending.     Cocaine abuse: Counseled extensively-he indicates he has a desire to quit.   Alcohol use: No signs of withdrawal symptoms-on CIWA protocol.  Counseled extensively-he indicates he has a desire to quit.   Tobacco abuse: Counseled-he indicates he has a desire to quit.   Procedures None  Discharge Diagnoses:  Principal Problem:   Hypertensive urgency Active Problems:   Acute left-sided weakness   Chest pain   Alcohol abuse   HIV (human immunodeficiency virus infection) (HCC)   Tobacco use   Polysubstance abuse (HCC)   Paresthesia   Schizophrenia (HCC)   Right-sided headache   Cerebral thrombosis with cerebral infarction   Discharge Instructions:  Activity:  As tolerated   Discharge Instructions     Ambulatory referral to Neurology   Complete by: As directed    An appointment is requested in approximately: 4 weeks  Call MD for:  extreme fatigue   Complete by: As directed    Call MD for:  persistant dizziness or light-headedness   Complete by: As  directed    Diet - low sodium heart healthy   Complete by: As directed    Discharge instructions   Complete by: As directed    Follow with Primary MD in 1-2 weeks  Follow with the stroke clinic as instructed  Follow with ID clinic as instructed  Please get a complete blood count and chemistry panel checked by your Primary MD at your next visit, and again as instructed by your Primary MD.  Get Medicines reviewed and adjusted: Please take all your medications with you for your next visit with your Primary MD  Laboratory/radiological data: Please request your Primary MD to go over all hospital tests and procedure/radiological results at the follow up, please ask your Primary MD to get all Hospital records sent to his/her office.  In some cases, they will be blood work, cultures and biopsy results pending at the time of your discharge. Please request that your primary care M.D. follows up on these results.  Also Note the following: If you experience worsening of your admission symptoms, develop shortness of breath, life threatening emergency, suicidal or homicidal thoughts you must seek medical attention immediately by calling 911 or calling your MD immediately  if symptoms less severe.  You must read complete instructions/literature along with all the possible adverse reactions/side effects for all the Medicines you take and that have been prescribed to you. Take any new Medicines after you have completely understood and accpet all the possible adverse reactions/side effects.   Do not drive when taking Pain medications or sleeping medications (Benzodaizepines)  Do not take more than prescribed Pain, Sleep and Anxiety Medications. It is not advisable to combine anxiety,sleep and pain medications without talking with your primary care practitioner  Special Instructions: If you have smoked or chewed Tobacco  in the last 2 yrs please stop smoking, stop any regular Alcohol  and or any  Recreational drug use.  Wear Seat belts while driving.  Please note: You were cared for by a hospitalist during your hospital stay. Once you are discharged, your primary care physician will handle any further medical issues. Please note that NO REFILLS for any discharge medications will be authorized once you are discharged, as it is imperative that you return to your primary care physician (or establish a relationship with a primary care physician if you do not have one) for your post hospital discharge needs so that they can reassess your need for medications and monitor your lab values.   Stop further alcohol, tobacco and cocaine use   Increase activity slowly   Complete by: As directed       Allergies as of 05/06/2021       Reactions   Grass Pollen(k-[id number] Vern) Rash        Medication List     TAKE these medications    amLODipine 5 MG tablet Commonly known as: NORVASC Take 1 tablet (5 mg total) by mouth daily.   aspirin 81 MG EC tablet Take 1 tablet (81 mg total) by mouth daily. Swallow whole. Start taking on: May 07, 2021 What changed: additional instructions   atorvastatin 80 MG tablet Commonly known as: LIPITOR Take 1 tablet (80 mg total) by mouth daily. Start taking on: May 07, 2021   Biktarvy 50-200-25 MG Tabs tablet Generic drug: bictegravir-emtricitabine-tenofovir AF Take 1 tablet by  mouth daily.   clopidogrel 75 MG tablet Commonly known as: PLAVIX Take 1 tablet (75 mg total) by mouth daily. Start taking on: May 07, 2021        Follow-up Information     Veryl Speak, FNP Follow up.   Specialties: Family Medicine, Infectious Diseases Why: 6/15 at 4pm. Please call to rescheudle if you are unable to make this appointment. Contact information: [address] [place] [address] [phone]         Primary care MD. Schedule an appointment as soon as possible for a visit in 1 week(s).          [facility] Follow up in 1 month(s).   Specialty: Neurology Why: Office will call with date/time, If you dont hear from them,please give them a call Contact information: [address] [place] [address] [phone]               Allergies  Allergen Reactions   Grass Pollen(K-O-R-T-Swt Vern) Rash    Consultations:  ID and neurology   Other Procedures/Studies: CT HEAD WO CONTRAST  Result Date: 05/04/2021 CLINICAL DATA:  Weakness. Left upper extremity decreased movement. Dizziness. Fall onto floor last night. EXAM: CT HEAD WITHOUT CONTRAST CT CERVICAL SPINE WITHOUT CONTRAST TECHNIQUE: Multidetector CT imaging of the head and cervical spine was performed following the standard protocol without intravenous contrast. Multiplanar CT image reconstructions of the cervical spine were also generated. COMPARISON:  03/19/2019 head CT. FINDINGS: CT HEAD FINDINGS Brain: Nonspecific mild subcortical and periventricular white matter hypodensity, most in keeping with chronic small vessel ischemic change. No evidence of parenchymal hemorrhage or extra-axial fluid collection. No mass lesion, mass effect, or midline shift. No CT evidence of acute infarction. Cerebral volume is age appropriate. No ventriculomegaly. Vascular: No acute abnormality. Skull: No evidence of calvarial fracture. Sinuses/Orbits: Mucoperiosteal thickening throughout the right maxillary sinus, largely new. No fluid levels. Other:  The mastoid air cells are unopacified. CT CERVICAL SPINE FINDINGS Alignment: Straightening of the cervical spine. No facet subluxation. Dens is well positioned between the lateral masses of C1. Skull base and vertebrae: No acute fracture. No primary bone lesion or focal pathologic process. Soft tissues and spinal canal: No prevertebral edema. No visible canal hematoma. Disc levels: Mild degenerative disc disease at C3-4. no significant facet arthropathy or degenerative foraminal  stenosis. Upper chest: No acute abnormality. Other: Visualized mastoid air cells appear clear. No discrete thyroid nodules. No pathologically enlarged cervical nodes. IMPRESSION: 1. No evidence of acute intracranial abnormality. No evidence of calvarial fracture. 2. Mild chronic small vessel ischemic changes in the cerebral white matter. 3. Right maxillary sinusitis, chronic appearing, although new since 2020 head CT. 4. No cervical spine fracture or subluxation. 5. Mild degenerative disc disease at C3-4. Electronically Signed   By: Delbert Phenix M.D.   On: 05/04/2021 19:10   CT CERVICAL SPINE WO CONTRAST  Result Date: 05/04/2021 CLINICAL DATA:  Weakness. Left upper extremity decreased movement. Dizziness. Fall onto floor last night. EXAM: CT HEAD WITHOUT CONTRAST CT CERVICAL SPINE WITHOUT CONTRAST TECHNIQUE: Multidetector CT imaging of the head and cervical spine was performed following the standard protocol without intravenous contrast. Multiplanar CT image reconstructions of the cervical spine were also generated. COMPARISON:  03/19/2019 head CT. FINDINGS: CT HEAD FINDINGS Brain: Nonspecific mild subcortical and periventricular white matter hypodensity, most in keeping with chronic small vessel ischemic change. No evidence of parenchymal hemorrhage or extra-axial fluid collection. No mass lesion,  mass effect, or midline shift. No CT evidence of acute infarction. Cerebral volume is age appropriate. No ventriculomegaly. Vascular: No acute abnormality. Skull: No evidence of calvarial fracture. Sinuses/Orbits: Mucoperiosteal thickening throughout the right maxillary sinus, largely new. No fluid levels. Other:  The mastoid air cells are unopacified. CT CERVICAL SPINE FINDINGS Alignment: Straightening of the cervical spine. No facet subluxation. Dens is well positioned between the lateral masses of C1. Skull base and vertebrae: No acute fracture. No primary bone lesion or focal pathologic process. Soft tissues and  spinal canal: No prevertebral edema. No visible canal hematoma. Disc levels: Mild degenerative disc disease at C3-4. no significant facet arthropathy or degenerative foraminal stenosis. Upper chest: No acute abnormality. Other: Visualized mastoid air cells appear clear. No discrete thyroid nodules. No pathologically enlarged cervical nodes. IMPRESSION: 1. No evidence of acute intracranial abnormality. No evidence of calvarial fracture. 2. Mild chronic small vessel ischemic changes in the cerebral white matter. 3. Right maxillary sinusitis, chronic appearing, although new since 2020 head CT. 4. No cervical spine fracture or subluxation. 5. Mild degenerative disc disease at C3-4. Electronically Signed   By: Delbert Phenix M.D.   On: 05/04/2021 19:10   MR ANGIO HEAD WO CONTRAST  Result Date: 05/05/2021 CLINICAL DATA:  Acute infarcts on MRI brain EXAM: MRA HEAD WITHOUT CONTRAST TECHNIQUE: Angiographic images of the Circle of Willis were acquired using MRA technique without intravenous contrast. COMPARISON:  None FINDINGS: Anterior circulation: Intracranial internal carotid arteries are patent. Anterior and middle cerebral arteries are patent. Posterior circulation: Intracranial vertebral arteries are partially imaged but patent. Basilar artery is patent. Posterior cerebral arteries are patent. Left larger than right posterior communicating arteries are present. Other: None. IMPRESSION: No proximal intracranial vessel occlusion or stenosis. Electronically Signed   By: Guadlupe Spanish M.D.   On: 05/05/2021 12:53   MR Brain W and Wo Contrast  Result Date: 05/05/2021 CLINICAL DATA:  Initial evaluation for acute weakness. EXAM: MRI HEAD WITHOUT AND WITH CONTRAST MRI CERVICAL SPINE WITHOUT AND WITH CONTRAST TECHNIQUE: Multiplanar, multiecho pulse sequences of the brain and surrounding structures, and cervical spine, to include the craniocervical junction and cervicothoracic junction, were obtained without and with  intravenous contrast. CONTRAST:  8mL GADAVIST GADOBUTROL 1 MMOL/ML IV SOLN COMPARISON:  Prior CT from 05/04/2021. FINDINGS: MRI HEAD FINDINGS Brain: Cerebral volume within normal limits for age. Scattered patchy T2/FLAIR hyperintensity within the periventricular and deep white matter both cerebral hemispheres most consistent with chronic small vessel ischemic disease, mild in nature. Patchy restricted diffusion seen extending in a somewhat linear oblique fashion across the right frontal and parietal lobes, with both cortical and subcortical involvement (series 5, images 105, 99, 95, 92). Finding consistent with acute ischemic infarcts. Associated patchy post-contrast enhancement likely reflects breakdown of the blood-brain barrier. No significant regional mass effect. No associated hemorrhage. These infarcts are watershed in distribution. No other evidence for acute or subacute infarct. Gray-white matter differentiation otherwise maintained. No other areas of remote cortical infarction. No other evidence for acute or chronic intracranial hemorrhage. No mass lesion, midline shift or mass effect. No hydrocephalus or extra-axial fluid collection. Pituitary gland suprasellar region normal. Midline structures intact. No other abnormal enhancement. Vascular: Major intracranial vascular flow voids are maintained. 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 Diffusely decreased signal intensity seen within the bone marrow of the upper cervical spine, nonspecific, but most commonly related to anemia, smoking, or  obesity. No focal marrow replacing lesion. No scalp soft tissue abnormality. Sinuses/Orbits: Globes and orbital soft tissues within normal limits. Scattered mucosal thickening noted within the ethmoidal air cells and maxillary sinuses. Superimposed small right maxillary sinus retention cyst. No mastoid effusion. Inner ear  structures grossly normal. 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 MRI CERVICAL SPINE FINDINGS Alignment: Straightening of the normal cervical lordosis. No listhesis. Vertebrae: Vertebral body height maintained without acute or chronic fracture. Bone marrow signal intensity diffusely decreased on T1 weighted imaging, nonspecific, but most commonly related to anemia, smoking, or obesity. No discrete or worrisome osseous lesions. No abnormal marrow edema or enhancement. Cord: Normal signal and morphology. Posterior Fossa, vertebral arteries, paraspinal tissues: Chiari 1 malformation again noted. Craniocervical junction otherwise unremarkable. Paraspinous and prevertebral soft tissues within normal limits. Normal flow voids seen within the vertebral arteries bilaterally. Few scattered subcentimeter cystic lesions noted within the parotid glands bilaterally, nonspecific, but suspected to be related history of HIV. Disc levels: C2-C3: Mild disc bulge with uncovertebral hypertrophy. No significant spinal stenosis. Mild right C3 foraminal narrowing. Left neural foramina remains patent. C3-C4: Disc bulge with bilateral uncovertebral hypertrophy. Posterior disc osteophyte flattens and partially effaces the ventral thecal sac. Minimal flattening of the ventral cord without cord signal changes. Mild spinal stenosis. Moderate right with mild to moderate left C4 foraminal stenosis. C4-C5: Mild disc bulge with uncovertebral hypertrophy. Flattening of the ventral thecal sac without significant spinal stenosis. Mild left C5 foraminal stenosis. Right neural foramina remains patent. C5-C6: Mild disc bulge with uncovertebral spurring. Superimposed tiny central disc protrusion minimally indents the ventral thecal sac. No significant spinal stenosis. Mild right C6 foraminal narrowing. Left neural foramina remains patent.  C6-C7: Mild disc bulge with right worse than left uncovertebral spurring. No significant spinal stenosis. Moderate right C7 foraminal stenosis. Left neural foramina remains patent. C7-T1: Minimal disc bulge. Mild facet and ligament flavum hypertrophy. No significant spinal stenosis. Foramina remain patent. Visualized upper thoracic spine demonstrates no significant finding. IMPRESSION: MRI HEAD IMPRESSION: 1. 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 No associated hemorrhage or significant mass effect. 2. Underlying mild chronic microvascular ischemic disease. 3. 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 MRI CERVICAL SPINE IMPRESSION: 1. No acute abnormality within the cervical spine. 2. Mild multilevel cervical spondylosis as above. Mild spinal stenosis at the C3-4 level. No other significant canal narrowing or evidence for cord impingement. 3. Moderate right C4 and C7 foraminal narrowing related to disc bulge and uncovertebral disease. Electronically Signed   By: Rise Mu M.D.   On: 05/05/2021 04:00   MR Cervical Spine W or Wo Contrast  Result Date: 05/05/2021 CLINICAL DATA:  Initial evaluation for acute weakness. EXAM: MRI HEAD WITHOUT AND WITH CONTRAST MRI CERVICAL SPINE WITHOUT AND WITH CONTRAST TECHNIQUE: Multiplanar, multiecho pulse sequences of the brain and surrounding structures, and cervical spine, to include the craniocervical junction and cervicothoracic junction, were obtained without and with intravenous contrast. CONTRAST:  8mL GADAVIST GADOBUTROL 1 MMOL/ML IV SOLN COMPARISON:  Prior CT from 05/04/2021. FINDINGS: MRI HEAD FINDINGS Brain: Cerebral volume within normal limits for age. Scattered patchy T2/FLAIR hyperintensity within the periventricular and deep white matter both cerebral hemispheres most consistent with chronic small vessel ischemic  disease, mild in nature. Patchy restricted diffusion seen extending in a somewhat linear oblique  fashion across the right frontal and parietal lobes, with both cortical and subcortical involvement (series 5, images 105, 99, 95, 92). Finding consistent with acute ischemic infarcts. Associated patchy post-contrast enhancement likely reflects breakdown of the blood-brain barrier. No significant regional mass effect. No associated hemorrhage. These infarcts are watershed in distribution. No other evidence for acute or subacute infarct. Gray-white matter differentiation otherwise maintained. No other areas of remote cortical infarction. No other evidence for acute or chronic intracranial hemorrhage. No mass lesion, midline shift or mass effect. No hydrocephalus or extra-axial fluid collection. Pituitary gland suprasellar region normal. Midline structures intact. No other abnormal enhancement. Vascular: Major intracranial vascular flow voids are maintained. 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 Diffusely decreased signal intensity seen within the bone marrow of the upper cervical spine, nonspecific, but most commonly related to anemia, smoking, or obesity. No focal marrow replacing lesion. No scalp soft tissue abnormality. Sinuses/Orbits: Globes and orbital soft tissues within normal limits. Scattered mucosal thickening noted within the ethmoidal air cells and maxillary sinuses. Superimposed small right maxillary sinus retention cyst. No mastoid effusion. Inner ear structures grossly normal. 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 MRI CERVICAL SPINE FINDINGS Alignment: Straightening of the normal cervical lordosis. No listhesis. Vertebrae: Vertebral body height maintained without acute or chronic fracture.  Bone marrow signal intensity diffusely decreased on T1 weighted imaging, nonspecific, but most commonly related to anemia, smoking, or obesity. No discrete or worrisome osseous lesions. No abnormal marrow edema or enhancement. Cord: Normal signal and morphology. Posterior Fossa, vertebral arteries, paraspinal tissues: Chiari 1 malformation again noted. Craniocervical junction otherwise unremarkable. Paraspinous and prevertebral soft tissues within normal limits. Normal flow voids seen within the vertebral arteries bilaterally. Few scattered subcentimeter cystic lesions noted within the parotid glands bilaterally, nonspecific, but suspected to be related history of HIV. Disc levels: C2-C3: Mild disc bulge with uncovertebral hypertrophy. No significant spinal stenosis. Mild right C3 foraminal narrowing. Left neural foramina remains patent. C3-C4: Disc bulge with bilateral uncovertebral hypertrophy. Posterior disc osteophyte flattens and partially effaces the ventral thecal sac. Minimal flattening of the ventral cord without cord signal changes. Mild spinal stenosis. Moderate right with mild to moderate left C4 foraminal stenosis. C4-C5: Mild disc bulge with uncovertebral hypertrophy. Flattening of the ventral thecal sac without significant spinal stenosis. Mild left C5 foraminal stenosis. Right neural foramina remains patent. C5-C6: Mild disc bulge with uncovertebral spurring. Superimposed tiny central disc protrusion minimally indents the ventral thecal sac. No significant spinal stenosis. Mild right C6 foraminal narrowing. Left neural foramina remains patent. C6-C7: Mild disc bulge with right worse than left uncovertebral spurring. No significant spinal stenosis. Moderate right C7 foraminal stenosis. Left neural foramina remains patent. C7-T1: Minimal disc bulge. Mild facet and ligament flavum hypertrophy. No significant spinal stenosis. Foramina remain patent. Visualized upper thoracic spine demonstrates no  significant finding. IMPRESSION: MRI HEAD IMPRESSION: 1. 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 No associated hemorrhage or significant mass effect. 2. Underlying mild chronic microvascular ischemic disease. 3. 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 MRI CERVICAL SPINE IMPRESSION: 1. No acute abnormality within the cervical spine. 2. Mild multilevel cervical spondylosis as above. Mild spinal stenosis at the C3-4 level. No other significant canal narrowing  or evidence for cord impingement. 3. Moderate right C4 and C7 foraminal narrowing related to disc bulge and uncovertebral disease. Electronically Signed   By: Rise Mu M.D.   On: 05/05/2021 04:00   DG Chest Portable 1 View  Result Date: 05/04/2021 CLINICAL DATA:  Shortness of breath. EXAM: PORTABLE CHEST 1 VIEW COMPARISON:  02/27/2019 FINDINGS: The cardiomediastinal contours are normal. Multiple skin folds project over the right hemithorax. Pulmonary vasculature is normal. No consolidation, pleural effusion, or pneumothorax. No acute osseous abnormalities are seen. IMPRESSION: No acute chest findings. Electronically Signed   By: Narda Rutherford M.D.   On: 05/04/2021 23:35   ECHOCARDIOGRAM COMPLETE BUBBLE STUDY  Result Date: 05/05/2021    ECHOCARDIOGRAM REPORT   Patient Name:   Marcus Nichols Date of Exam: 05/05/2021 Medical Rec #:  [id number]    Height:       71.0 in Accession #:    [id number]   Weight:       170.0 lb Date of Birth:  [birth year]    BSA:          1.968 m Patient Age:    55 years     BP:           161/94 mmHg Patient Gender: M            HR:           65 bpm. Exam Location:  Inpatient Procedure: 2D Echo, Cardiac Doppler, Color Doppler and Saline Contrast Bubble            Study Indications:    Stroke I63.9  History:        Patient has no prior history of Echocardiogram examinations.                  Risk Factors:Sleep Apnea.  Sonographer:    Tiffany Dance Referring Phys: [id number] [facility] KHALIQDINA IMPRESSIONS  1. Left ventricular ejection fraction, by estimation, is 60 to 65%. The left ventricle has normal function. The left ventricle has no regional wall motion abnormalities. There is mild left ventricular hypertrophy. Left ventricular diastolic parameters are indeterminate.  2. Right ventricular systolic function is normal. The right ventricular size is normal. There is normal pulmonary artery systolic pressure. The estimated right ventricular systolic pressure is 21.7 mmHg.  3. Left atrial size was mild to moderately dilated.  4. The mitral valve is normal in structure. No evidence of mitral valve regurgitation. No evidence of mitral stenosis.  5. The aortic valve is normal in structure. Aortic valve regurgitation is trivial. No aortic stenosis is present.  6. The inferior vena cava is dilated in size with >50% respiratory variability, suggesting right atrial pressure of 8 mmHg.  7. Agitated saline contrast bubble study was negative, with no evidence of any interatrial shunt. Conclusion(s)/Recommendation(s): No intracardiac source of embolism detected on this transthoracic study. A transesophageal echocardiogram is recommended to exclude cardiac source of embolism if clinically indicated. FINDINGS  Left Ventricle: Left ventricular ejection fraction, by estimation, is 60 to 65%. The left ventricle has normal function. The left ventricle has no regional wall motion abnormalities. The left ventricular internal cavity size was normal in size. There is  mild left ventricular hypertrophy. Left ventricular diastolic parameters are indeterminate. Right Ventricle: The right ventricular size is normal. No increase in right ventricular wall thickness. Right ventricular systolic function is normal. There is normal pulmonary artery systolic pressure. The tricuspid regurgitant velocity is 1.85 m/s, and  with an  assumed right atrial pressure of  8 mmHg, the estimated right ventricular systolic pressure is 21.7 mmHg. Left Atrium: Left atrial size was mild to moderately dilated. Right Atrium: Right atrial size was normal in size. Pericardium: There is no evidence of pericardial effusion. Mitral Valve: The mitral valve is normal in structure. No evidence of mitral valve regurgitation. No evidence of mitral valve stenosis. Tricuspid Valve: The tricuspid valve is normal in structure. Tricuspid valve regurgitation is trivial. No evidence of tricuspid stenosis. Aortic Valve: The aortic valve is normal in structure. Aortic valve regurgitation is trivial. Aortic regurgitation PHT measures 779 msec. No aortic stenosis is present. Pulmonic Valve: The pulmonic valve was normal in structure. Pulmonic valve regurgitation is trivial. No evidence of pulmonic stenosis. Aorta: The aortic root is normal in size and structure. Venous: The inferior vena cava is dilated in size with greater than 50% respiratory variability, suggesting right atrial pressure of 8 mmHg. IAS/Shunts: No atrial level shunt detected by color flow Doppler. Agitated saline contrast was given intravenously to evaluate for intracardiac shunting. Agitated saline contrast bubble study was negative, with no evidence of any interatrial shunt.  LEFT VENTRICLE PLAX 2D LVIDd:         4.80 cm  Diastology LVIDs:         3.40 cm  LV e' medial:    7.46 cm/s LV PW:         1.10 cm  LV E/e' medial:  9.9 LV IVS:        1.00 cm  LV e' lateral:   13.50 cm/s LVOT diam:     2.10 cm  LV E/e' lateral: 5.5 LV SV:         71 LV SV Index:   36 LVOT Area:     3.46 cm  RIGHT VENTRICLE             IVC RV Basal diam:  2.70 cm     IVC diam: 2.50 cm RV S prime:     10.20 cm/s TAPSE (M-mode): 2.2 cm LEFT ATRIUM             Index       RIGHT ATRIUM           Index LA diam:        3.80 cm 1.93 cm/m  RA Area:     13.70 cm LA Vol (A2C):   93.4 ml 47.46 ml/m RA Volume:   31.30 ml  15.91 ml/m LA Vol  (A4C):   60.1 ml 30.54 ml/m LA Biplane Vol: 74.5 ml 37.86 ml/m  AORTIC VALVE LVOT Vmax:   95.60 cm/s LVOT Vmean:  66.600 cm/s LVOT VTI:    0.205 m AI PHT:      779 msec  AORTA Ao Root diam: 3.90 cm Ao Asc diam:  3.80 cm MITRAL VALVE               TRICUSPID VALVE MV Area (PHT): 2.99 cm    TR Peak grad:   13.7 mmHg MV Decel Time: 254 msec    TR Vmax:        185.00 cm/s MV E velocity: 74.20 cm/s MV A velocity: 51.90 cm/s  SHUNTS MV E/A ratio:  1.43        Systemic VTI:  0.20 m                            Systemic Diam: 2.10 cm Donato Schultz MD Electronically signed by Donato Schultz MD  Signature Date/Time: 05/05/2021/2:49:44 PM    Final    VAS US CAROTID  Result Date: 05/05/2021 Carotid Arterial Duplex Study Patient Name:  READ BONELLI  Date of Exam:   05/05/2021 Medical Rec #: [id number]     Accession #:    [id number] Date of Birth: [birth year]     Patient Gender: M Patient Age:   55Y Exam Location:  [facility] Procedure:      VAS US CAROTID Referring Phys: [id number] [facility] --------------------------------------------------------------------------------  Indications:       CVA. Risk Factors:      None. Comparison Study:  No prior study Performing Technologist: Gertie Fey MHA, RDMS, RVT, RDCS  Examination Guidelines: A complete evaluation includes B-mode imaging, spectral Doppler, color Doppler, and power Doppler as needed of all accessible portions of each vessel. Bilateral testing is considered an integral part of a complete examination. Limited examinations for reoccurring indications may be performed as noted.  Right Carotid Findings: +----------+--------+--------+--------+---------------------+------------------+           PSV cm/sEDV cm/sStenosisPlaque Description   Comments           +----------+--------+--------+--------+---------------------+------------------+ CCA Prox  122     14                                                       +----------+--------+--------+--------+---------------------+------------------+ CCA Distal79      16                                   intimal thickening +----------+--------+--------+--------+---------------------+------------------+ ICA Prox  64      12              smooth and                                                                homogeneous                             +----------+--------+--------+--------+---------------------+------------------+ ICA Distal98      22                                                      +----------+--------+--------+--------+---------------------+------------------+ ECA       94      17                                                      +----------+--------+--------+--------+---------------------+------------------+ +----------+--------+-------+----------------+-------------------+           PSV cm/sEDV cmsDescribe        Arm Pressure (mmHG) +----------+--------+-------+----------------+-------------------+ [id number]            Multiphasic, WNL                    +----------+--------+-------+----------------+-------------------+ +---------+--------+--+--------+--+---------+  VertebralPSV cm/s51EDV cm/s12Antegrade +---------+--------+--+--------+--+---------+  Left Carotid Findings: +----------+--------+--------+--------+---------------------+------------------+           PSV cm/sEDV cm/sStenosisPlaque Description   Comments           +----------+--------+--------+--------+---------------------+------------------+ CCA Prox  108     19                                                      +----------+--------+--------+--------+---------------------+------------------+ CCA Distal89      20                                   intimal thickening +----------+--------+--------+--------+---------------------+------------------+ ICA Prox  40      8               smooth and                                                                 homogeneous                             +----------+--------+--------+--------+---------------------+------------------+ ICA Distal81      23                                                      +----------+--------+--------+--------+---------------------+------------------+ ECA       98      16                                                      +----------+--------+--------+--------+---------------------+------------------+ +----------+--------+--------+----------------+-------------------+           PSV cm/sEDV cm/sDescribe        Arm Pressure (mmHG) +----------+--------+--------+----------------+-------------------+ [id number]              Multiphasic, WNL                    +----------+--------+--------+----------------+-------------------+ +---------+--------+--+--------+-+---------+ VertebralPSV cm/s43EDV cm/s7Antegrade +---------+--------+--+--------+-+---------+   Summary: Right Carotid: Velocities in the right ICA are consistent with a 1-39% stenosis. Left Carotid: Velocities in the left ICA are consistent with a 1-39% stenosis. Vertebrals:  Bilateral vertebral arteries demonstrate antegrade flow. Subclavians: Normal flow hemodynamics were seen in bilateral subclavian              arteries. *See table(s) above for measurements and observations.     Preliminary      TODAY-DAY OF DISCHARGE:  Subjective:   Nevin Grizzle today has no headache,no chest abdominal pain,no new weakness tingling or numbness, feels much better wants to go home today.   Objective:   Blood pressure (!) 176/87, pulse 69, temperature 98 F (36.7 C), temperature source Oral, resp. rate 16, SpO2 100 %.  Intake/Output Summary (Last 24 hours) at 05/06/2021  1056 Last data filed at 05/06/2021 0924 Gross per 24 hour  Intake 1239 ml  Output 650 ml  Net 589 ml   There were no vitals filed for this visit.  Exam: Awake Alert, Oriented *3, No new  F.N deficits, Normal affect Lena.AT,PERRAL Supple Neck,No JVD, No cervical lymphadenopathy appriciated.  Symmetrical Chest wall movement, Good air movement bilaterally, CTAB RRR,No Gallops,Rubs or new Murmurs, No Parasternal Heave +ve B.Sounds, Abd Soft, Non tender, No organomegaly appriciated, No rebound -guarding or rigidity. No Cyanosis, Clubbing or edema, No new Rash or bruise   PERTINENT RADIOLOGIC STUDIES: CT HEAD WO CONTRAST  Result Date: 05/04/2021 CLINICAL DATA:  Weakness. Left upper extremity decreased movement. Dizziness. Fall onto floor last night. EXAM: CT HEAD WITHOUT CONTRAST CT CERVICAL SPINE WITHOUT CONTRAST TECHNIQUE: Multidetector CT imaging of the head and cervical spine was performed following the standard protocol without intravenous contrast. Multiplanar CT image reconstructions of the cervical spine were also generated. COMPARISON:  03/19/2019 head CT. FINDINGS: CT HEAD FINDINGS Brain: Nonspecific mild subcortical and periventricular white matter hypodensity, most in keeping with chronic small vessel ischemic change. No evidence of parenchymal hemorrhage or extra-axial fluid collection. No mass lesion, mass effect, or midline shift. No CT evidence of acute infarction. Cerebral volume is age appropriate. No ventriculomegaly. Vascular: No acute abnormality. Skull: No evidence of calvarial fracture. Sinuses/Orbits: Mucoperiosteal thickening throughout the right maxillary sinus, largely new. No fluid levels. Other:  The mastoid air cells are unopacified. CT CERVICAL SPINE FINDINGS Alignment: Straightening of the cervical spine. No facet subluxation. Dens is well positioned between the lateral masses of C1. Skull base and vertebrae: No acute fracture. No primary bone lesion or focal pathologic process. Soft tissues and spinal canal: No prevertebral edema. No visible canal hematoma. Disc levels: Mild degenerative disc disease at C3-4. no significant facet arthropathy or degenerative  foraminal stenosis. Upper chest: No acute abnormality. Other: Visualized mastoid air cells appear clear. No discrete thyroid nodules. No pathologically enlarged cervical nodes. IMPRESSION: 1. No evidence of acute intracranial abnormality. No evidence of calvarial fracture. 2. Mild chronic small vessel ischemic changes in the cerebral white matter. 3. Right maxillary sinusitis, chronic appearing, although new since 2020 head CT. 4. No cervical spine fracture or subluxation. 5. Mild degenerative disc disease at C3-4. Electronically Signed   By: Delbert Phenix M.D.   On: 05/04/2021 19:10   CT CERVICAL SPINE WO CONTRAST  Result Date: 05/04/2021 CLINICAL DATA:  Weakness. Left upper extremity decreased movement. Dizziness. Fall onto floor last night. EXAM: CT HEAD WITHOUT CONTRAST CT CERVICAL SPINE WITHOUT CONTRAST TECHNIQUE: Multidetector CT imaging of the head and cervical spine was performed following the standard protocol without intravenous contrast. Multiplanar CT image reconstructions of the cervical spine were also generated. COMPARISON:  03/19/2019 head CT. FINDINGS: CT HEAD FINDINGS Brain: Nonspecific mild subcortical and periventricular white matter hypodensity, most in keeping with chronic small vessel ischemic change. No evidence of parenchymal hemorrhage or extra-axial fluid collection. No mass lesion, mass effect, or midline shift. No CT evidence of acute infarction. Cerebral volume is age appropriate. No ventriculomegaly. Vascular: No acute abnormality. Skull: No evidence of calvarial fracture. Sinuses/Orbits: Mucoperiosteal thickening throughout the right maxillary sinus, largely new. No fluid levels. Other:  The mastoid air cells are unopacified. CT CERVICAL SPINE FINDINGS Alignment: Straightening of the cervical spine. No facet subluxation. Dens is well positioned between the lateral masses of C1. Skull base and vertebrae: No acute fracture. No primary bone lesion or focal pathologic process. Soft  tissues  and spinal canal: No prevertebral edema. No visible canal hematoma. Disc levels: Mild degenerative disc disease at C3-4. no significant facet arthropathy or degenerative foraminal stenosis. Upper chest: No acute abnormality. Other: Visualized mastoid air cells appear clear. No discrete thyroid nodules. No pathologically enlarged cervical nodes. IMPRESSION: 1. No evidence of acute intracranial abnormality. No evidence of calvarial fracture. 2. Mild chronic small vessel ischemic changes in the cerebral white matter. 3. Right maxillary sinusitis, chronic appearing, although new since 2020 head CT. 4. No cervical spine fracture or subluxation. 5. Mild degenerative disc disease at C3-4. Electronically Signed   By: Delbert Phenix M.D.   On: 05/04/2021 19:10   MR ANGIO HEAD WO CONTRAST  Result Date: 05/05/2021 CLINICAL DATA:  Acute infarcts on MRI brain EXAM: MRA HEAD WITHOUT CONTRAST TECHNIQUE: Angiographic images of the Circle of Willis were acquired using MRA technique without intravenous contrast. COMPARISON:  None FINDINGS: Anterior circulation: Intracranial internal carotid arteries are patent. Anterior and middle cerebral arteries are patent. Posterior circulation: Intracranial vertebral arteries are partially imaged but patent. Basilar artery is patent. Posterior cerebral arteries are patent. Left larger than right posterior communicating arteries are present. Other: None. IMPRESSION: No proximal intracranial vessel occlusion or stenosis. Electronically Signed   By: Guadlupe Spanish M.D.   On: 05/05/2021 12:53   MR Brain W and Wo Contrast  Result Date: 05/05/2021 CLINICAL DATA:  Initial evaluation for acute weakness. EXAM: MRI HEAD WITHOUT AND WITH CONTRAST MRI CERVICAL SPINE WITHOUT AND WITH CONTRAST TECHNIQUE: Multiplanar, multiecho pulse sequences of the brain and surrounding structures, and cervical spine, to include the craniocervical junction and cervicothoracic junction, were obtained without and  with intravenous contrast. CONTRAST:  8mL GADAVIST GADOBUTROL 1 MMOL/ML IV SOLN COMPARISON:  Prior CT from 05/04/2021. FINDINGS: MRI HEAD FINDINGS Brain: Cerebral volume within normal limits for age. Scattered patchy T2/FLAIR hyperintensity within the periventricular and deep white matter both cerebral hemispheres most consistent with chronic small vessel ischemic disease, mild in nature. Patchy restricted diffusion seen extending in a somewhat linear oblique fashion across the right frontal and parietal lobes, with both cortical and subcortical involvement (series 5, images 105, 99, 95, 92). Finding consistent with acute ischemic infarcts. Associated patchy post-contrast enhancement likely reflects breakdown of the blood-brain barrier. No significant regional mass effect. No associated hemorrhage. These infarcts are watershed in distribution. No other evidence for acute or subacute infarct. Gray-white matter differentiation otherwise maintained. No other areas of remote cortical infarction. No other evidence for acute or chronic intracranial hemorrhage. No mass lesion, midline shift or mass effect. No hydrocephalus or extra-axial fluid collection. Pituitary gland suprasellar region normal. Midline structures intact. No other abnormal enhancement. Vascular: Major intracranial vascular flow voids are maintained. 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 Diffusely decreased signal intensity seen within the bone marrow of the upper cervical spine, nonspecific, but most commonly related to anemia, smoking, or obesity. No focal marrow replacing lesion. No scalp soft tissue abnormality. Sinuses/Orbits: Globes and orbital soft tissues within normal limits. Scattered mucosal thickening noted within the ethmoidal air cells and maxillary sinuses. Superimposed small right maxillary sinus retention cyst. No mastoid effusion. Inner  ear structures grossly normal. 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 MRI CERVICAL SPINE FINDINGS Alignment: Straightening of the normal cervical lordosis. No listhesis. Vertebrae: Vertebral body height maintained without acute  or chronic fracture. Bone marrow signal intensity diffusely decreased on T1 weighted imaging, nonspecific, but most commonly related to anemia, smoking, or obesity. No discrete or worrisome osseous lesions. No abnormal marrow edema or enhancement. Cord: Normal signal and morphology. Posterior Fossa, vertebral arteries, paraspinal tissues: Chiari 1 malformation again noted. Craniocervical junction otherwise unremarkable. Paraspinous and prevertebral soft tissues within normal limits. Normal flow voids seen within the vertebral arteries bilaterally. Few scattered subcentimeter cystic lesions noted within the parotid glands bilaterally, nonspecific, but suspected to be related history of HIV. Disc levels: C2-C3: Mild disc bulge with uncovertebral hypertrophy. No significant spinal stenosis. Mild right C3 foraminal narrowing. Left neural foramina remains patent. C3-C4: Disc bulge with bilateral uncovertebral hypertrophy. Posterior disc osteophyte flattens and partially effaces the ventral thecal sac. Minimal flattening of the ventral cord without cord signal changes. Mild spinal stenosis. Moderate right with mild to moderate left C4 foraminal stenosis. C4-C5: Mild disc bulge with uncovertebral hypertrophy. Flattening of the ventral thecal sac without significant spinal stenosis. Mild left C5 foraminal stenosis. Right neural foramina remains patent. C5-C6: Mild disc bulge with uncovertebral spurring. Superimposed tiny central disc protrusion minimally indents the ventral thecal sac. No significant spinal stenosis. Mild right C6 foraminal narrowing. Left neural foramina remains patent.  C6-C7: Mild disc bulge with right worse than left uncovertebral spurring. No significant spinal stenosis. Moderate right C7 foraminal stenosis. Left neural foramina remains patent. C7-T1: Minimal disc bulge. Mild facet and ligament flavum hypertrophy. No significant spinal stenosis. Foramina remain patent. Visualized upper thoracic spine demonstrates no significant finding. IMPRESSION: MRI HEAD IMPRESSION: 1. 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 No associated hemorrhage or significant mass effect. 2. Underlying mild chronic microvascular ischemic disease. 3. 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 MRI CERVICAL SPINE IMPRESSION: 1. No acute abnormality within the cervical spine. 2. Mild multilevel cervical spondylosis as above. Mild spinal stenosis at the C3-4 level. No other significant canal narrowing or evidence for cord impingement. 3. Moderate right C4 and C7 foraminal narrowing related to disc bulge and uncovertebral disease. Electronically Signed   By: Rise Mu M.D.   On: 05/05/2021 04:00   MR Cervical Spine W or Wo Contrast  Result Date: 05/05/2021 CLINICAL DATA:  Initial evaluation for acute weakness. EXAM: MRI HEAD WITHOUT AND WITH CONTRAST MRI CERVICAL SPINE WITHOUT AND WITH CONTRAST TECHNIQUE: Multiplanar, multiecho pulse sequences of the brain and surrounding structures, and cervical spine, to include the craniocervical junction and cervicothoracic junction, were obtained without and with intravenous contrast. CONTRAST:  8mL GADAVIST GADOBUTROL 1 MMOL/ML IV SOLN COMPARISON:  Prior CT from 05/04/2021. FINDINGS: MRI HEAD FINDINGS Brain: Cerebral volume within normal limits for age. Scattered patchy T2/FLAIR hyperintensity within the periventricular and deep white matter both cerebral hemispheres most consistent with chronic small vessel ischemic  disease, mild in nature. Patchy restricted diffusion seen extending in a somewhat linear oblique fashion across the right frontal and parietal lobes, with both cortical and subcortical involvement (series 5, images 105, 99, 95, 92). Finding consistent with acute ischemic infarcts. Associated patchy post-contrast enhancement likely reflects breakdown of the blood-brain barrier. No significant regional mass effect. No associated hemorrhage. These infarcts are watershed in distribution. No other evidence for acute or subacute infarct. Gray-white matter differentiation otherwise maintained. No other areas of remote cortical infarction. No other evidence for acute or chronic intracranial hemorrhage. No mass lesion, midline shift or mass effect. No  hydrocephalus or extra-axial fluid collection. Pituitary gland suprasellar region normal. Midline structures intact. No other abnormal enhancement. Vascular: Major intracranial vascular flow voids are maintained. 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 Diffusely decreased signal intensity seen within the bone marrow of the upper cervical spine, nonspecific, but most commonly related to anemia, smoking, or obesity. No focal marrow replacing lesion. No scalp soft tissue abnormality. Sinuses/Orbits: Globes and orbital soft tissues within normal limits. Scattered mucosal thickening noted within the ethmoidal air cells and maxillary sinuses. Superimposed small right maxillary sinus retention cyst. No mastoid effusion. Inner ear structures grossly normal. 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 MRI CERVICAL SPINE FINDINGS Alignment: Straightening of the normal cervical lordosis. No listhesis. Vertebrae: Vertebral body height maintained without acute or chronic fracture.  Bone marrow signal intensity diffusely decreased on T1 weighted imaging, nonspecific, but most commonly related to anemia, smoking, or obesity. No discrete or worrisome osseous lesions. No abnormal marrow edema or enhancement. Cord: Normal signal and morphology. Posterior Fossa, vertebral arteries, paraspinal tissues: Chiari 1 malformation again noted. Craniocervical junction otherwise unremarkable. Paraspinous and prevertebral soft tissues within normal limits. Normal flow voids seen within the vertebral arteries bilaterally. Few scattered subcentimeter cystic lesions noted within the parotid glands bilaterally, nonspecific, but suspected to be related history of HIV. Disc levels: C2-C3: Mild disc bulge with uncovertebral hypertrophy. No significant spinal stenosis. Mild right C3 foraminal narrowing. Left neural foramina remains patent. C3-C4: Disc bulge with bilateral uncovertebral hypertrophy. Posterior disc osteophyte flattens and partially effaces the ventral thecal sac. Minimal flattening of the ventral cord without cord signal changes. Mild spinal stenosis. Moderate right with mild to moderate left C4 foraminal stenosis. C4-C5: Mild disc bulge with uncovertebral hypertrophy. Flattening of the ventral thecal sac without significant spinal stenosis. Mild left C5 foraminal stenosis. Right neural foramina remains patent. C5-C6: Mild disc bulge with uncovertebral spurring. Superimposed tiny central disc protrusion minimally indents the ventral thecal sac. No significant spinal stenosis. Mild right C6 foraminal narrowing. Left neural foramina remains patent. C6-C7: Mild disc bulge with right worse than left uncovertebral spurring. No significant spinal stenosis. Moderate right C7 foraminal stenosis. Left neural foramina remains patent. C7-T1: Minimal disc bulge. Mild facet and ligament flavum hypertrophy. No significant spinal stenosis. Foramina remain patent. Visualized upper thoracic spine demonstrates no  significant finding. IMPRESSION: MRI HEAD IMPRESSION: 1. 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 No associated hemorrhage or significant mass effect. 2. Underlying mild chronic microvascular ischemic disease. 3. 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 MRI CERVICAL SPINE IMPRESSION: 1. No acute abnormality within the cervical spine. 2. Mild multilevel cervical spondylosis as above. Mild spinal stenosis at the C3-4 level. No other significant canal narrowing or evidence for cord impingement. 3. Moderate right C4 and C7 foraminal narrowing related to disc bulge and uncovertebral disease. Electronically Signed   By: Rise Mu M.D.   On: 05/05/2021 04:00   DG Chest Portable 1 View  Result Date: 05/04/2021 CLINICAL DATA:  Shortness of breath. EXAM: PORTABLE CHEST 1 VIEW COMPARISON:  02/27/2019 FINDINGS: The cardiomediastinal contours are normal. Multiple skin folds project over the right hemithorax. Pulmonary vasculature is normal. No consolidation, pleural effusion, or pneumothorax. No acute osseous abnormalities are seen. IMPRESSION:  No acute chest findings. Electronically Signed   By: Narda Rutherford M.D.   On: 05/04/2021 23:35   ECHOCARDIOGRAM COMPLETE BUBBLE STUDY  Result Date: 05/05/2021    ECHOCARDIOGRAM REPORT   Patient Name:   Marcus Nichols Date of Exam: 05/05/2021 Medical Rec #:  [id number]    Height:       71.0 in Accession #:    [id number]   Weight:       170.0 lb Date of Birth:  1966/01/12    BSA:          1.968 m Patient Age:    55 years     BP:           161/94 mmHg Patient Gender: M            HR:           65 bpm. Exam Location:  Inpatient Procedure: 2D Echo, Cardiac Doppler, Color Doppler and Saline Contrast Bubble            Study Indications:    Stroke I63.9  History:        Patient has no prior history of Echocardiogram examinations.                  Risk Factors:Sleep Apnea.  Sonographer:    Tiffany Dance Referring Phys: [id number] [facility] KHALIQDINA IMPRESSIONS  1. Left ventricular ejection fraction, by estimation, is 60 to 65%. The left ventricle has normal function. The left ventricle has no regional wall motion abnormalities. There is mild left ventricular hypertrophy. Left ventricular diastolic parameters are indeterminate.  2. Right ventricular systolic function is normal. The right ventricular size is normal. There is normal pulmonary artery systolic pressure. The estimated right ventricular systolic pressure is 21.7 mmHg.  3. Left atrial size was mild to moderately dilated.  4. The mitral valve is normal in structure. No evidence of mitral valve regurgitation. No evidence of mitral stenosis.  5. The aortic valve is normal in structure. Aortic valve regurgitation is trivial. No aortic stenosis is present.  6. The inferior vena cava is dilated in size with >50% respiratory variability, suggesting right atrial pressure of 8 mmHg.  7. Agitated saline contrast bubble study was negative, with no evidence of any interatrial shunt. Conclusion(s)/Recommendation(s): No intracardiac source of embolism detected on this transthoracic study. A transesophageal echocardiogram is recommended to exclude cardiac source of embolism if clinically indicated. FINDINGS  Left Ventricle: Left ventricular ejection fraction, by estimation, is 60 to 65%. The left ventricle has normal function. The left ventricle has no regional wall motion abnormalities. The left ventricular internal cavity size was normal in size. There is  mild left ventricular hypertrophy. Left ventricular diastolic parameters are indeterminate. Right Ventricle: The right ventricular size is normal. No increase in right ventricular wall thickness. Right ventricular systolic function is normal. There is normal pulmonary artery systolic pressure. The tricuspid regurgitant velocity is 1.85 m/s, and  with an  assumed right atrial pressure of 8 mmHg, the estimated right ventricular systolic pressure is 21.7 mmHg. Left Atrium: Left atrial size was mild to moderately dilated. Right Atrium: Right atrial size was normal in size. Pericardium: There is no evidence of pericardial effusion. Mitral Valve: The mitral valve is normal in structure. No evidence of mitral valve regurgitation. No evidence of mitral valve stenosis. Tricuspid Valve: The tricuspid valve is normal in structure. Tricuspid valve regurgitation is trivial. No evidence of tricuspid stenosis. Aortic Valve: The aortic valve is normal in structure. Aortic valve regurgitation  is trivial. Aortic regurgitation PHT measures 779 msec. No aortic stenosis is present. Pulmonic Valve: The pulmonic valve was normal in structure. Pulmonic valve regurgitation is trivial. No evidence of pulmonic stenosis. Aorta: The aortic root is normal in size and structure. Venous: The inferior vena cava is dilated in size with greater than 50% respiratory variability, suggesting right atrial pressure of 8 mmHg. IAS/Shunts: No atrial level shunt detected by color flow Doppler. Agitated saline contrast was given intravenously to evaluate for intracardiac shunting. Agitated saline contrast bubble study was negative, with no evidence of any interatrial shunt.  LEFT VENTRICLE PLAX 2D LVIDd:         4.80 cm  Diastology LVIDs:         3.40 cm  LV e' medial:    7.46 cm/s LV PW:         1.10 cm  LV E/e' medial:  9.9 LV IVS:        1.00 cm  LV e' lateral:   13.50 cm/s LVOT diam:     2.10 cm  LV E/e' lateral: 5.5 LV SV:         71 LV SV Index:   36 LVOT Area:     3.46 cm  RIGHT VENTRICLE             IVC RV Basal diam:  2.70 cm     IVC diam: 2.50 cm RV S prime:     10.20 cm/s TAPSE (M-mode): 2.2 cm LEFT ATRIUM             Index       RIGHT ATRIUM           Index LA diam:        3.80 cm 1.93 cm/m  RA Area:     13.70 cm LA Vol (A2C):   93.4 ml 47.46 ml/m RA Volume:   31.30 ml  15.91 ml/m LA Vol  (A4C):   60.1 ml 30.54 ml/m LA Biplane Vol: 74.5 ml 37.86 ml/m  AORTIC VALVE LVOT Vmax:   95.60 cm/s LVOT Vmean:  66.600 cm/s LVOT VTI:    0.205 m AI PHT:      779 msec  AORTA Ao Root diam: 3.90 cm Ao Asc diam:  3.80 cm MITRAL VALVE               TRICUSPID VALVE MV Area (PHT): 2.99 cm    TR Peak grad:   13.7 mmHg MV Decel Time: 254 msec    TR Vmax:        185.00 cm/s MV E velocity: 74.20 cm/s MV A velocity: 51.90 cm/s  SHUNTS MV E/A ratio:  1.43        Systemic VTI:  0.20 m                            Systemic Diam: 2.10 cm Donato Schultz MD Electronically signed by Donato Schultz MD Signature Date/Time: 05/05/2021/2:49:44 PM    Final    VAS US CAROTID  Result Date: 05/05/2021 Carotid Arterial Duplex Study Patient Name:  Marcus Nichols  Date of Exam:   05/05/2021 Medical Rec #: [id number]     Accession #:    [id number] Date of Birth: 04/14/1966     Patient Gender: M Patient Age:   55Y Exam Location:  [facility] Procedure:      VAS US CAROTID Referring Phys: [id number] [facility] --------------------------------------------------------------------------------  Indications:  CVA. Risk Factors:      None. Comparison Study:  No prior study Performing Technologist: Gertie FeyMichelle Simonetti MHA, RDMS, RVT, RDCS  Examination Guidelines: A complete evaluation includes B-mode imaging, spectral Doppler, color Doppler, and power Doppler as needed of all accessible portions of each vessel. Bilateral testing is considered an integral part of a complete examination. Limited examinations for reoccurring indications may be performed as noted.  Right Carotid Findings: +----------+--------+--------+--------+---------------------+------------------+           PSV cm/sEDV cm/sStenosisPlaque Description   Comments           +----------+--------+--------+--------+---------------------+------------------+ CCA Prox  122     14                                                       +----------+--------+--------+--------+---------------------+------------------+ CCA Distal79      16                                   intimal thickening +----------+--------+--------+--------+---------------------+------------------+ ICA Prox  64      12              smooth and                                                                homogeneous                             +----------+--------+--------+--------+---------------------+------------------+ ICA Distal98      22                                                      +----------+--------+--------+--------+---------------------+------------------+ ECA       94      17                                                      +----------+--------+--------+--------+---------------------+------------------+ +----------+--------+-------+----------------+-------------------+           PSV cm/sEDV cmsDescribe        Arm Pressure (mmHG) +----------+--------+-------+----------------+-------------------+ [id number]            Multiphasic, WNL                    +----------+--------+-------+----------------+-------------------+ +---------+--------+--+--------+--+---------+ VertebralPSV cm/s51EDV cm/s12Antegrade +---------+--------+--+--------+--+---------+  Left Carotid Findings: +----------+--------+--------+--------+---------------------+------------------+           PSV cm/sEDV cm/sStenosisPlaque Description   Comments           +----------+--------+--------+--------+---------------------+------------------+ CCA Prox  108     19                                                      +----------+--------+--------+--------+---------------------+------------------+  CCA Distal89      20                                   intimal thickening +----------+--------+--------+--------+---------------------+------------------+ ICA Prox  40      8               smooth and                                                                 homogeneous                             +----------+--------+--------+--------+---------------------+------------------+ ICA Distal81      23                                                      +----------+--------+--------+--------+---------------------+------------------+ ECA       98      16                                                      +----------+--------+--------+--------+---------------------+------------------+ +----------+--------+--------+----------------+-------------------+           PSV cm/sEDV cm/sDescribe        Arm Pressure (mmHG) +----------+--------+--------+----------------+-------------------+ [id number]              Multiphasic, WNL                    +----------+--------+--------+----------------+-------------------+ +---------+--------+--+--------+-+---------+ VertebralPSV cm/s43EDV cm/s7Antegrade +---------+--------+--+--------+-+---------+   Summary: Right Carotid: Velocities in the right ICA are consistent with a 1-39% stenosis. Left Carotid: Velocities in the left ICA are consistent with a 1-39% stenosis. Vertebrals:  Bilateral vertebral arteries demonstrate antegrade flow. Subclavians: Normal flow hemodynamics were seen in bilateral subclavian              arteries. *See table(s) above for measurements and observations.     Preliminary      PERTINENT LAB RESULTS: CBC: Recent Labs    05/04/21 1751 05/04/21 1803  WBC 6.3  --   HGB 16.5 16.7  HCT 49.8 49.0  PLT 288  --    CMET CMP     Component Value Date/Time   NA 140 05/04/2021 1803   K 4.2 05/04/2021 1803   CL 105 05/04/2021 1803   CO2 23 05/04/2021 1751   GLUCOSE 167 (H) 05/04/2021 1803   BUN 5 (L) 05/04/2021 1803   CREATININE 0.90 05/04/2021 1803   CALCIUM 8.3 (L) 05/04/2021 1751   PROT 7.3 05/04/2021 1751   ALBUMIN 3.0 (L) 05/04/2021 1751   AST 48 (H) 05/04/2021 1751   ALT 29 05/04/2021 1751   ALKPHOS 79 05/04/2021  1751   BILITOT 0.9 05/04/2021 1751   GFRNONAA >60 05/04/2021 1751   GFRAA 54 (L) 03/28/2020 1855    GFR CrCl cannot be calculated (Unknown ideal weight.). No results for input(s):  LIPASE, AMYLASE in the last 72 hours. No results for input(s): CKTOTAL, CKMB, CKMBINDEX, TROPONINI in the last 72 hours. Invalid input(s): POCBNP No results for input(s): DDIMER in the last 72 hours. No results for input(s): HGBA1C in the last 72 hours. Recent Labs    05/05/21 0330  CHOL 108  HDL 41  LDLCALC 47  TRIG 100  CHOLHDL 2.6   No results for input(s): TSH, T4TOTAL, T3FREE, THYROIDAB in the last 72 hours.  Invalid input(s): FREET3 No results for input(s): VITAMINB12, FOLATE, FERRITIN, TIBC, IRON, RETICCTPCT in the last 72 hours. Coags: Recent Labs    05/04/21 1751  INR 1.1   Microbiology: Recent Results (from the past 240 hour(s))  Resp Panel by RT-PCR (Flu A&B, Covid) Nasopharyngeal Swab     Status: None   Collection Time: 05/04/21  6:07 PM   Specimen: Nasopharyngeal Swab; Nasopharyngeal(NP) swabs in vial transport medium  Result Value Ref Range Status   SARS Coronavirus 2 by RT PCR NEGATIVE NEGATIVE Final    Comment: (NOTE) SARS-CoV-2 target nucleic acids are NOT DETECTED.  The SARS-CoV-2 RNA is generally detectable in upper respiratory specimens during the acute phase of infection. The lowest concentration of SARS-CoV-2 viral copies this assay can detect is 138 copies/mL. A negative result does not preclude SARS-Cov-2 infection and should not be used as the sole basis for treatment or other patient management decisions. A negative result may occur with  improper specimen collection/handling, submission of specimen other than nasopharyngeal swab, presence of viral mutation(s) within the areas targeted by this assay, and inadequate number of viral copies(<138 copies/mL). A negative result must be combined with clinical observations, patient history, and  epidemiological information. The expected result is Negative.  Fact Sheet for Patients:  BloggerCourse.com  Fact Sheet for Healthcare Providers:  SeriousBroker.it  This test is no t yet approved or cleared by the [place] FDA and  has been authorized for detection and/or diagnosis of SARS-CoV-2 by FDA under an Emergency Use Authorization (EUA). This EUA will remain  in effect (meaning this test can be used) for the duration of the COVID-19 declaration under Section 564(b)(1) of the Act, 21 U.S.C.section 360bbb-3(b)(1), unless the authorization is terminated  or revoked sooner.       Influenza A by PCR NEGATIVE NEGATIVE Final   Influenza B by PCR NEGATIVE NEGATIVE Final    Comment: (NOTE) The Xpert Xpress SARS-CoV-2/FLU/RSV plus assay is intended as an aid in the diagnosis of influenza from Nasopharyngeal swab specimens and should not be used as a sole basis for treatment. Nasal washings and aspirates are unacceptable for Xpert Xpress SARS-CoV-2/FLU/RSV testing.  Fact Sheet for Patients: BloggerCourse.com  Fact Sheet for Healthcare Providers: SeriousBroker.it  This test is not yet approved or cleared by the [place] FDA and has been authorized for detection and/or diagnosis of SARS-CoV-2 by FDA under an Emergency Use Authorization (EUA). This EUA will remain in effect (meaning this test can be used) for the duration of the COVID-19 declaration under Section 564(b)(1) of the Act, 21 U.S.C. section 360bbb-3(b)(1), unless the authorization is terminated or revoked.  Performed at [facility], [address], [place], [place] [address]     FURTHER DISCHARGE INSTRUCTIONS:  Get Medicines reviewed and adjusted: Please take all your medications with you for your next visit with your Primary MD  Laboratory/radiological data: Please request your Primary MD to go  over all hospital tests and procedure/radiological results at the follow up, please ask your Primary MD  to get all Hospital records sent to his/her office.  In some cases, they will be blood work, cultures and biopsy results pending at the time of your discharge. Please request that your primary care M.D. goes through all the records of your hospital data and follows up on these results.  Also Note the following: If you experience worsening of your admission symptoms, develop shortness of breath, life threatening emergency, suicidal or homicidal thoughts you must seek medical attention immediately by calling 911 or calling your MD immediately  if symptoms less severe.  You must read complete instructions/literature along with all the possible adverse reactions/side effects for all the Medicines you take and that have been prescribed to you. Take any new Medicines after you have completely understood and accpet all the possible adverse reactions/side effects.   Do not drive when taking Pain medications or sleeping medications (Benzodaizepines)  Do not take more than prescribed Pain, Sleep and Anxiety Medications. It is not advisable to combine anxiety,sleep and pain medications without talking with your primary care practitioner  Special Instructions: If you have smoked or chewed Tobacco  in the last 2 yrs please stop smoking, stop any regular Alcohol  and or any Recreational drug use.  Wear Seat belts while driving.  Please note: You were cared for by a hospitalist during your hospital stay. Once you are discharged, your primary care physician will handle any further medical issues. Please note that NO REFILLS for any discharge medications will be authorized once you are discharged, as it is imperative that you return to your primary care physician (or establish a relationship with a primary care physician if you do not have one) for your post hospital discharge needs so that they can reassess your  need for medications and monitor your lab values.  Total Time spent coordinating discharge including counseling, education and face to face time equals 35 minutes.  SignedJeoffrey Massed 05/06/2021 10:56 AM

## 2021-05-06 NOTE — TOC Initial Note (Addendum)
Transition of Care [facility]) - Initial/Assessment Note    Patient Details  Name: Marcus Nichols MRN: [id number] Date of Birth: Feb 21, 1966  Transition [facility]) CM/SW Contact:    Beckie Busing, RN Phone Number: 05/06/2021, 11:16 AM  Clinical Narrative:                 [facility] consulted for patient with d/c med and PCP follow up needs. Patient is from home where he functions independently. Patient states that he does not have PCP or insurance. CM set up follow up appt with [facility] wellness. MATCH form complete. TOC pharmacy to deliver meds to bedside. CAGE aid complete and resources given. Patient has been made aware of appointments and meds to be delivered. Artist from [employer] [facility] [employer] will contact patient for assistant with payments.  Expected Discharge Plan: Home/Self Care Barriers to Discharge: No Barriers Identified   Patient Goals and CMS Choice Patient states their goals for this hospitalization and ongoing recovery are:: Patient wants to seek long term solution for drug addiction CMS Medicare.gov Compare Post Acute Care list provided to:: Patient Choice offered to / list presented to : Patient  Expected Discharge Plan and Services Expected Discharge Plan: Home/Self Care In-house Referral: NA Discharge Planning Services: CM Consult, MATCH Program, Follow-up appt scheduled Post Acute Care Choice: NA Living arrangements for the past 2 months: Single Family Home Expected Discharge Date: 05/06/21               DME Arranged: N/A DME Agency: NA         HH Agency: NA        Prior Living Arrangements/Services Living arrangements for the past 2 months: Single Family Home Lives with:: Self Patient language and need for interpreter reviewed:: Yes Do you feel safe going back to the place where you live?: Yes      Need for Family Participation in Patient Care: Yes (Comment)   Current home services:  (n/a) Criminal Activity/Legal Involvement  Pertinent to Current Situation/Hospitalization: No - Comment as needed  Activities of Daily Living      Permission Sought/Granted   Permission granted to share information with : No              Emotional Assessment Appearance:: Appears stated age Attitude/Demeanor/Rapport: Gracious Affect (typically observed): Pleasant Orientation: : Oriented to Place, Oriented to Self, Oriented to  Time, Oriented to Situation Alcohol / Substance Use: Illicit Drugs Psych Involvement: No (comment)  Admission diagnosis:  Hypertensive urgency [I16.0] Alcohol use disorder, severe, dependence (HCC) [F10.20] Cerebrovascular accident (CVA), unspecified mechanism (HCC) [I63.9] Hypertension, unspecified type [I10] Cocaine use disorder (HCC) [F14.10] HIV infection, unspecified symptom status (HCC) [B20] Patient Active Problem List   Diagnosis Date Noted   Hypertensive urgency 05/05/2021   Acute left-sided weakness 05/05/2021   Chest pain 05/05/2021   Alcohol abuse 05/05/2021   HIV (human immunodeficiency virus infection) (HCC) 05/05/2021   Tobacco use 05/05/2021   Polysubstance abuse (HCC) 05/05/2021   Paresthesia 05/05/2021   Schizophrenia (HCC) 05/05/2021   Right-sided headache 05/05/2021   Cerebral thrombosis with cerebral infarction 05/05/2021   Accelerated hypertension 03/20/2019   Opiate overdose (HCC) 03/19/2019   Leukocytosis 03/19/2019   Hypokalemia 03/19/2019   Left arm pain 03/19/2019   Elevated blood pressure reading 03/19/2019   Comminuted fracture of humerus, left, open, initial encounter 02/28/2019   PCP:  Patient, No Pcp Per (Inactive) Pharmacy:   [id number] - [place], [place] - [address]  [facility] [address] [place] [address] Phone: [phone] Fax: [phone]  [facility] [address] [place] [address] Phone: [phone] Fax: [phone]     Social Determinants of Health (SDOH) Interventions     Readmission Risk Interventions No flowsheet data found.

## 2021-05-06 NOTE — Progress Notes (Signed)
[facility]  Date of Admission:  05/04/2021             ASSESSMENT:  Marcus Nichols has improvement in his left sided deficits and is feeling better. Cryptococcal antigen is negative and will start ART therapy with Biktarvy. Viral load, CD4, genotype and RPR remain pendings. Pharmacy has obtained 30 day approval for [facility] for medication assistance and will deliver medication to the bedside. Schedule follow up appointment with [facility] for next week to apply for [employer] and UMAP/ADAP assistance. Ok for discharge from ID standpoint.   PLAN:  Start [employer]. 30 day of supply of medication to bedside with [employer] [facility]. Await CD4, viral load, genotype, and RPR results.  Follow up in ID clinic in 1 week.  Stroke care per Neurology.  Ok for discharge from ID standpoint.   Principal Problem:   Hypertensive urgency Active Problems:   Acute left-sided weakness   Chest pain   Alcohol abuse   HIV (human immunodeficiency virus infection) (HCC)   Tobacco use   Polysubstance abuse (HCC)   Paresthesia   Schizophrenia (HCC)   Right-sided headache   Cerebral thrombosis with cerebral infarction     stroke: mapping our early stages of recovery book   Does not apply Once   aspirin EC  81 mg Oral Daily   atorvastatin  80 mg Oral Daily   bictegravir-emtricitabine-tenofovir AF  1 tablet Oral Daily   clopidogrel  75 mg Oral Daily   enoxaparin (LOVENOX) injection  40 mg Subcutaneous Q24H   folic acid  1 mg Oral Daily   LORazepam  0-4 mg Intravenous Q6H   Followed by   Melene Muller ON 05/07/2021] LORazepam  0-4 mg Intravenous Q12H   multivitamin with minerals  1 tablet Oral Daily   sodium chloride flush  3 mL Intravenous Once   thiamine  100 mg Oral Daily   Or   thiamine  100 mg Intravenous Daily    SUBJECTIVE:  Afebrile overnight. Cryptococcal antigen negative. Has improved left sided function. Denies fevers/chills.   Allergies  Allergen Reactions    Grass Pollen(K-O-R-T-Swt Vern) Rash     Review of Systems: Review of Systems  Constitutional:  Negative for chills, fever and weight loss.  Respiratory:  Negative for cough, shortness of breath and wheezing.   Cardiovascular:  Negative for chest pain and leg swelling.  Gastrointestinal:  Negative for abdominal pain, constipation, diarrhea, nausea and vomiting.  Skin:  Negative for rash.     OBJECTIVE: Vitals:   05/05/21 2006 05/06/21 0457 05/06/21 0500 05/06/21 0913  BP: (!) 154/84 (!) 176/84 (!) 178/84 (!) 176/87  Pulse: 65 70 67 69  Resp: 18  19 16   Temp: 98.1 F (36.7 C)  97.7 F (36.5 C) 98 F (36.7 C)  TempSrc:    Oral  SpO2: 100% 100% 100% 100%   There is no height or weight on file to calculate BMI.  Physical Exam Constitutional:      General: He is not in acute distress.    Appearance: He is well-developed.  Eyes:     Conjunctiva/sclera: Conjunctivae normal.  Cardiovascular:     Rate and Rhythm: Normal rate and regular rhythm.     Heart sounds: Normal heart sounds. No murmur heard.   No friction rub. No gallop.  Pulmonary:     Effort: Pulmonary effort is normal. No respiratory distress.     Breath sounds: Normal breath sounds. No wheezing or rales.  Chest:  Chest wall: No tenderness.  Abdominal:     General: Bowel sounds are normal.     Palpations: Abdomen is soft.     Tenderness: There is no abdominal tenderness.  Musculoskeletal:     Cervical back: Neck supple.  Lymphadenopathy:     Cervical: No cervical adenopathy.  Skin:    General: Skin is warm and dry.     Findings: No rash.  Neurological:     Mental Status: He is alert and oriented to person, place, and time.  Psychiatric:        Behavior: Behavior normal.        Thought Content: Thought content normal.        Judgment: Judgment normal.    Lab Results Lab Results  Component Value Date   WBC 6.3 05/04/2021   HGB 16.7 05/04/2021   HCT 49.0 05/04/2021   MCV 91.7 05/04/2021   PLT 288  05/04/2021    Lab Results  Component Value Date   CREATININE 0.90 05/04/2021   BUN 5 (L) 05/04/2021   NA 140 05/04/2021   K 4.2 05/04/2021   CL 105 05/04/2021   CO2 23 05/04/2021    Lab Results  Component Value Date   ALT 29 05/04/2021   AST 48 (H) 05/04/2021   ALKPHOS 79 05/04/2021   BILITOT 0.9 05/04/2021     Microbiology: Recent Results (from the past 240 hour(s))  Resp Panel by RT-PCR (Flu A&B, Covid) Nasopharyngeal Swab     Status: None   Collection Time: 05/04/21  6:07 PM   Specimen: Nasopharyngeal Swab; Nasopharyngeal(NP) swabs in vial transport medium  Result Value Ref Range Status   SARS Coronavirus 2 by RT PCR NEGATIVE NEGATIVE Final    Comment: (NOTE) SARS-CoV-2 target nucleic acids are NOT DETECTED.  The SARS-CoV-2 RNA is generally detectable in upper respiratory specimens during the acute phase of infection. The lowest concentration of SARS-CoV-2 viral copies this assay can detect is 138 copies/mL. A negative result does not preclude SARS-Cov-2 infection and should not be used as the sole basis for treatment or other patient management decisions. A negative result may occur with  improper specimen collection/handling, submission of specimen other than nasopharyngeal swab, presence of viral mutation(s) within the areas targeted by this assay, and inadequate number of viral copies(<138 copies/mL). A negative result must be combined with clinical observations, patient history, and epidemiological information. The expected result is Negative.  Fact Sheet for Patients:  BloggerCourse.com  Fact Sheet for Healthcare Providers:  SeriousBroker.it  This test is no t yet approved or cleared by the [place] FDA and  has been authorized for detection and/or diagnosis of SARS-CoV-2 by FDA under an Emergency Use Authorization (EUA). This EUA will remain  in effect (meaning this test can be used) for the duration of  the COVID-19 declaration under Section 564(b)(1) of the Act, 21 U.S.C.section 360bbb-3(b)(1), unless the authorization is terminated  or revoked sooner.       Influenza A by PCR NEGATIVE NEGATIVE Final   Influenza B by PCR NEGATIVE NEGATIVE Final    Comment: (NOTE) The Xpert Xpress SARS-CoV-2/FLU/RSV plus assay is intended as an aid in the diagnosis of influenza from Nasopharyngeal swab specimens and should not be used as a sole basis for treatment. Nasal washings and aspirates are unacceptable for Xpert Xpress SARS-CoV-2/FLU/RSV testing.  Fact Sheet for Patients: BloggerCourse.com  Fact Sheet for Healthcare Providers: SeriousBroker.it  This test is not yet approved or cleared by the [place] and  has been authorized for detection and/or diagnosis of SARS-CoV-2 by FDA under an Emergency Use Authorization (EUA). This EUA will remain in effect (meaning this test can be used) for the duration of the COVID-19 declaration under Section 564(b)(1) of the Act, 21 U.S.C. section 360bbb-3(b)(1), unless the authorization is terminated or revoked.  Performed at [facility], [address], [place], [place] [address]      Marcos Eke, NP [facility]  05/06/2021  10:12 AM

## 2021-05-06 NOTE — Evaluation (Signed)
Physical Therapy Evaluation Patient Details Name: Marcus Nichols MRN: [id number] DOB: 08-19-1966 Today's Date: 05/06/2021   History of Present Illness  55 y.o. male presented 05/04/21 with left sided weakness and fall. 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 Pt is noncompliant and is not treating any of his medical conditions.  Clinical Impression  Pt admitted secondary to problem above with deficits below. 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 Pt currently requires supervision for ambulation due to mild impulsivity and mild LLE weakness. He had increased fall risk when trying to walk in slide shoes that improved with hospital slipper socks. He scored 51/56 on Berg Balance assessment which puts him at lower risk of falling. Feel he can benefit from additional PT while hospitalized-- Will continue to follow acutely to maximize functional mobility independence and safety.       Follow Up Recommendations No PT follow up;Supervision for mobility/OOB    Equipment Recommendations  None recommended by PT    Recommendations for Other Services OT consult     Precautions / Restrictions Precautions Precautions: Fall Precaution Comments: lower risk, but present      Mobility  Bed Mobility Overal bed mobility: Independent                  Transfers Overall transfer level: Needs assistance Equipment used: None Transfers: Sit to/from Stand Sit to Stand: Min guard;Supervision         General transfer comment: initially started to stand impulsively after asked to wait in sitting; minor imbalance on initial sit to stand with pt recovering; with  repetition progressed to supervision without imbalance  Ambulation/Gait Ambulation/Gait assistance: Min guard Gait Distance (Feet): 180 Feet Assistive device: None Gait Pattern/deviations: Step-through pattern;Decreased step length - left;Decreased dorsiflexion - left ("vaulting wiht RLE to clear LLE) Gait velocity: WNL   General Gait Details: initally with slide shoes as pt did not have hospital slippers present. Slide on left foot pivoting under his foot and foot sliding posterior relative to slide with incr tripping/fall risk. Repeated ambulation with hospital socks with improved dynamics, but continued with mild vaulting on RLE  Stairs            Wheelchair Mobility    Modified Rankin (Stroke Patients Only) Modified Rankin (Stroke Patients Only) Pre-Morbid Rankin Score: No symptoms Modified Rankin: Moderately severe disability     Balance                                 Standardized Balance Assessment Standardized Balance Assessment : Berg Balance Test Berg Balance Test Sit to Stand: Able to stand without using hands and stabilize independently Standing Unsupported: Able to stand safely 2 minutes Sitting with Back Unsupported but Feet Supported on Floor or Stool: Able to sit safely and securely 2 minutes Stand to Sit: Sits safely with minimal use of hands Transfers: Able to transfer safely, minor use of hands Standing Unsupported with Eyes Closed: Able to stand 10 seconds safely Standing Ubsupported with Feet Together: Able to place feet together independently and stand 1 minute safely From Standing, Reach Forward with Outstretched Arm: Can reach confidently >25 cm (10") From  Standing Position, Pick up Object from Floor: Able to pick up shoe safely and easily From Standing Position, Turn to Look Behind Over each Shoulder: Looks behind from both sides and weight shifts well Turn 360 Degrees: Able to turn 360 degrees safely but slowly Standing Unsupported,  Alternately Place Feet on Step/Stool: Able to complete >2 steps/needs minimal assist Standing Unsupported, One Foot in Front: Able to place foot tandem independently and hold 30 seconds Standing on One Leg: Able to lift leg independently and hold > 10 seconds Total Score: 51         Pertinent Vitals/Pain Pain Assessment: No/denies pain    Home Living Family/patient expects to be discharged to:: Private residence Living Arrangements: Other (Comment) ("one of my baby-mama's") Available Help at Discharge: Friend(s) Type of Home: House Home Access:  (pt unable to give specifics as not sure which home he'll go to)     Home Layout: One level (denies going to 2 story house with whomever he lives with) Home Equipment: None      Prior Function Level of Independence: Independent         Comments: 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)     Hand Dominance   Dominant Hand: Right    Extremity/Trunk Assessment   Upper Extremity Assessment Upper Extremity Assessment: Defer to OT evaluation    Lower Extremity Assessment Lower Extremity Assessment: LLE deficits/detail LLE Deficits / Details: knee extension 3+, ankle DF 5/5, noted weak toes as trying to don slide shoe and difficulty keeping shoe on his left foot when walking LLE Sensation:  (denies changes)    Cervical / Trunk Assessment Cervical / Trunk Assessment: Normal  Communication   Communication: No difficulties  Cognition Arousal/Alertness: Awake/alert Behavior During Therapy: Restless;Impulsive Overall Cognitive Status: Impaired/Different from baseline Area of Impairment: Safety/judgement;Awareness                         Safety/Judgement: Decreased awareness of safety;Decreased awareness of deficits Awareness: Intellectual (not noticing difficulty maintaining his slide shoe on his left foot during ambulation; even when pointed out to him he denied it being a problem)   General Comments: Started to  stand twice prior to PT requesting and after asking him to sit until I was ready      General Comments General comments (skin integrity, edema, etc.): Adressed safety issue with wearing his slide shoes and pt ultimately agreed he should not wear them as he does not want to trip/fall. Educated in appropriate foot wear for him at this time and he reported he can have someone bring his shoes before he goes home    Exercises     Assessment/Plan    PT Assessment Patient needs continued PT services  PT Problem List Decreased strength;Decreased balance;Decreased mobility;Decreased cognition;Decreased safety awareness       PT Treatment Interventions Gait training;Stair training;Functional mobility training;Therapeutic activities;Therapeutic exercise;Balance training;Neuromuscular re-education;Cognitive remediation;Patient/family education    PT Goals (Current goals can be found in the Care Plan section)  Acute Rehab PT Goals Patient Stated Goal: go home today PT Goal Formulation: With patient Time For Goal Achievement: 05/13/21 Potential to Achieve Goals: Good    Frequency Min 4X/week   Barriers to discharge        Co-evaluation               AM-PAC PT "6 Clicks" Mobility  Outcome Measure Help needed turning from your back to your side while in a flat  bed without using bedrails?: None Help needed moving from lying on your back to sitting on the side of a flat bed without using bedrails?: None Help needed moving to and from a bed to a chair (including a wheelchair)?: A Little Help needed standing up from a chair using your arms (e.g., wheelchair or bedside chair)?: A Little Help needed to walk in hospital room?: A Little Help needed climbing 3-5 steps with a railing? : A Little 6 Click Score: 20    End of Session Equipment Utilized During Treatment: Gait belt Activity Tolerance: Patient tolerated treatment well Patient left: in chair;with call bell/phone within reach;with  chair alarm set;with nursing/sitter in room Nurse Communication: Mobility status PT Visit Diagnosis: Unsteadiness on feet (R26.81);Hemiplegia and hemiparesis Hemiplegia - Right/Left: Left Hemiplegia - dominant/non-dominant: Non-dominant Hemiplegia - caused by: Cerebral infarction    Time: 0623-7628 PT Time Calculation (min) (ACUTE ONLY): 28 min   Charges:   PT Evaluation $PT Eval Moderate Complexity: 1 Mod PT Treatments $Gait Training: 8-22 mins         Jerolyn Center, PT Pager [phone]   Zena Amos 05/06/2021, 10:08 AM

## 2021-05-12 ENCOUNTER — Ambulatory Visit: Payer: Self-pay

## 2021-05-12 ENCOUNTER — Encounter: Payer: Self-pay | Admitting: Family

## 2021-05-12 ENCOUNTER — Ambulatory Visit (INDEPENDENT_AMBULATORY_CARE_PROVIDER_SITE_OTHER): Payer: Self-pay | Admitting: Family

## 2021-05-12 ENCOUNTER — Other Ambulatory Visit: Payer: Self-pay

## 2021-05-12 VITALS — BP 154/99 | HR 91 | Temp 97.9°F | Wt 170.0 lb

## 2021-05-12 DIAGNOSIS — Z21 Asymptomatic human immunodeficiency virus [HIV] infection status: Secondary | ICD-10-CM

## 2021-05-12 DIAGNOSIS — F1010 Alcohol abuse, uncomplicated: Secondary | ICD-10-CM

## 2021-05-12 DIAGNOSIS — Z0000 Encounter for general adult medical examination without abnormal findings: Secondary | ICD-10-CM | POA: Insufficient documentation

## 2021-05-12 DIAGNOSIS — I10 Essential (primary) hypertension: Secondary | ICD-10-CM

## 2021-05-12 DIAGNOSIS — I6330 Cerebral infarction due to thrombosis of unspecified cerebral artery: Secondary | ICD-10-CM

## 2021-05-12 MED ORDER — BICTEGRAVIR-EMTRICITAB-TENOFOV 50-200-25 MG PO TABS: 1.0000 | ORAL_TABLET | Freq: Every day | ORAL | 3 refills | Status: DC

## 2021-05-12 NOTE — Assessment & Plan Note (Signed)
   Discussed importance of safe sexual practice to reduce risk of STI. Condoms offered and declined.   Due for routine dental care. Will place referral to [facility] if needed.   Check immunizations.

## 2021-05-12 NOTE — Progress Notes (Signed)
Subjective:    Patient ID: Marcus Nichols, male    DOB: 07-15-1966, 55 y.o.   MRN: [id number]  Chief Complaint  Patient presents with   Hospitalization Follow-up    In the hospital for stroke , follow up hospital d/c , no pain      HPI:  Marcus Nichols is a 55 y.o. male with previous medical history of hypertension, schizophrenia, sleep apnea, alcohol/cocaine/tobacco use and poorly controlled HIV disease admitted to the hospital with left sided weakness and concern for acute CVA.  Marcus Nichols was initially diagnosed with HIV in 1999 with risk factors including drug use. Initial CD4 count and viral load were unknown. Previous ART history of Sustiva, Combivir and Genvoya. He was previous in care in [place] with Dr. Wynetta Emery prior to moving to [place]. Was well controlled until stopping medication in early 2018 secondary to life stressors and the death of his mother and daughter which has increased his alcohol intake. CD4 count was 272 and testing for cryptococcal meningitis and RPR were negative. Genotype and viral load remain pending. Started on [place] and now presenting for hospitalization follow up.   Marcus Nichols has been doing well since leaving the hospital. Has been taking his Biktarvy daily as prescribed with no adverse side effects or missed doses. Overall feeling well today. Working with physical therapy as part of the recovery from his CVA. Left side weakness is improving slowly. Denies fevers, chills, night sweats, headaches, changes in vision, neck pain/stiffness, nausea, diarrhea, vomiting, lesions or rashes.  Marcus Nichols will need financial assistance through [place]. Denies any recent recreational or illicit drug use, tobacco use or alcohol consumption. Denies feelings of being down, depressed or hopeless recently. Declines condoms. Due for routine dental care.  Allergies  Allergen Reactions   Grass Pollen(K-O-R-T-Swt Vern) Rash      Outpatient Medications Prior to Visit   Medication Sig Dispense Refill   amLODipine (NORVASC) 5 MG tablet Take 1 tablet (5 mg total) by mouth daily. 90 tablet 0   aspirin 81 MG EC tablet Take 1 tablet (81 mg total) by mouth daily. Swallow whole. 90 tablet 0   atorvastatin (LIPITOR) 80 MG tablet Take 1 tablet (80 mg total) by mouth daily. 90 tablet 0   clopidogrel (PLAVIX) 75 MG tablet Take 1 tablet (75 mg total) by mouth daily. 90 tablet 0   bictegravir-emtricitabine-tenofovir AF (BIKTARVY) 50-200-25 MG TABS tablet Take 1 tablet by mouth daily. 30 tablet 0   No facility-administered medications prior to visit.     Past Medical History:  Diagnosis Date   Asthma    Eczema    Enlarged prostate    HIV (human immunodeficiency virus infection) ([place])    Humerus fracture    left    Inguinal hernia    Schizophrenia (HCC)    Sleep apnea    does not wear CPAP ( per spouse )     Past Surgical History:  Procedure Laterality Date   MULTIPLE TOOTH EXTRACTIONS     ORIF HUMERUS FRACTURE Left 03/07/2019   Procedure: OPEN REDUCTION INTERNAL FIXATION (ORIF) HUMERAL SHAFT FRACTURE;  Surgeon: Shona Needles, MD;  Location: [facility];  Service: Orthopedics;  Laterality: Left;       Review of Systems  Constitutional:  Negative for appetite change, chills, fatigue, fever and unexpected weight change.  Eyes:  Negative for visual disturbance.  Respiratory:  Negative for cough, chest tightness, shortness of breath and wheezing.   Cardiovascular:  Negative for chest pain and  leg swelling.  Gastrointestinal:  Negative for abdominal pain, constipation, diarrhea, nausea and vomiting.  Genitourinary:  Negative for dysuria, flank pain, frequency, genital sores, hematuria and urgency.  Skin:  Negative for rash.  Allergic/Immunologic: Negative for immunocompromised state.  Neurological:  Negative for dizziness and headaches.     Objective:    BP (!) 154/99   Pulse 91   Temp 97.9 F (36.6 C)   Wt 170 lb (77.1 kg)   SpO2 97%   BMI 23.71  kg/m  Nursing note and vital signs reviewed.  Physical Exam Constitutional:      General: He is not in acute distress.    Appearance: He is well-developed.  Eyes:     Conjunctiva/sclera: Conjunctivae normal.  Cardiovascular:     Rate and Rhythm: Normal rate and regular rhythm.     Heart sounds: Normal heart sounds. No murmur heard.   No friction rub. No gallop.  Pulmonary:     Effort: Pulmonary effort is normal. No respiratory distress.     Breath sounds: Normal breath sounds. No wheezing or rales.  Chest:     Chest wall: No tenderness.  Abdominal:     General: Bowel sounds are normal.     Palpations: Abdomen is soft.     Tenderness: There is no abdominal tenderness.  Musculoskeletal:     Cervical back: Neck supple.  Lymphadenopathy:     Cervical: No cervical adenopathy.  Skin:    General: Skin is warm and dry.     Findings: No rash.  Neurological:     Mental Status: He is alert and oriented to person, place, and time.  Psychiatric:        Behavior: Behavior normal.        Thought Content: Thought content normal.        Judgment: Judgment normal.     Depression screen PHQ 2/9 05/12/2021  Decreased Interest 0  Down, Depressed, Hopeless 0  PHQ - 2 Score 0       Assessment & Plan:    Patient Active Problem List   Diagnosis Date Noted   Healthcare maintenance 05/12/2021   Hypertensive urgency 05/05/2021   Acute left-sided weakness 05/05/2021   Chest pain 05/05/2021   Alcohol abuse 05/05/2021   HIV (human immunodeficiency virus infection) ([place]) 05/05/2021   Tobacco use 05/05/2021   Polysubstance abuse ([place]) 05/05/2021   Paresthesia 05/05/2021   Schizophrenia ([place]) 05/05/2021   Right-sided headache 05/05/2021   Cerebral thrombosis with cerebral infarction 05/05/2021   Accelerated hypertension 03/20/2019   Opiate overdose ([place]) 03/19/2019   Leukocytosis 03/19/2019   Hypokalemia 03/19/2019   Left arm pain 03/19/2019   Elevated blood pressure reading  03/19/2019   Comminuted fracture of humerus, left, open, initial encounter 02/28/2019     Problem List Items Addressed This Visit       Cardiovascular and Mediastinum   Accelerated hypertension    Blood pressure elevated above goal today with current dose of amlodipine. Continue to monitor with adjustments per primary team and neurology.        Cerebral thrombosis with cerebral infarction    Left sided weakness appears to have improved following discharge and working with physical therapy. Continue management and reduction of risk factors per neurology          Other   Alcohol abuse    Has been sober since leaving the hospital per his reports. Encouraged to continue with sobriety and recommend counseling as needed for continued support.  HIV (human immunodeficiency virus infection) ([place]) - Primary    Marcus Nichols is a 55 y/o AA male diagnosed with HIV disease in 1999 with risk factor of drug use and heterosexual contact. No history of opportunistic infection. Initial viral load and CD4 count unknown. Has been out of care since 2018 and now restarted on Biktarvy and appears to be tolerating well. Check lab work today. Will apply for [place] today. Continue current dose of Biktarvy. Plan for follow up in 1 month or sooner if needed.        Relevant Medications   bictegravir-emtricitabine-tenofovir AF (BIKTARVY) 50-200-25 MG TABS tablet   Other Relevant Orders   HIV RNA, RTPCR W/R GT (RTI, PI,INT)   Comp Met (CMET)   HLA B*5701   QuantiFERON-TB Gold Plus   T-helper cell (CD4)- ([facility] clinic only)   Healthcare maintenance    Discussed importance of safe sexual practice to reduce risk of STI. Condoms offered and declined.  Due for routine dental care. Will place referral to [facility] if needed.  Check immunizations.          I am having Retta Diones maintain his aspirin, atorvastatin, clopidogrel, amLODipine, and bictegravir-emtricitabine-tenofovir AF.   Meds  ordered this encounter  Medications   bictegravir-emtricitabine-tenofovir AF (BIKTARVY) 50-200-25 MG TABS tablet    Sig: Take 1 tablet by mouth daily.    Dispense:  30 tablet    Refill:  3    BIN: [id number] PCN: [id number] Group: [id number] Member: [id number]    Order Specific Question:   Supervising Provider    Answer:   Carlyle Basques [[id number]]     Follow-up: Return in about 1 month (around 06/11/2021).   Terri Piedra, MSN, FNP-C Nurse Practitioner [facility] [place] number: [phone]

## 2021-05-12 NOTE — Assessment & Plan Note (Signed)
Marcus Nichols is a 55 y/o AA male diagnosed with HIV disease in 1999 with risk factor of drug use and heterosexual contact. No history of opportunistic infection. Initial viral load and CD4 count unknown. Has been out of care since 2018 and now restarted on Biktarvy and appears to be tolerating well. Check lab work today. Will apply for [employer] and [employer] today. Continue current dose of Biktarvy. Plan for follow up in 1 month or sooner if needed.

## 2021-05-12 NOTE — Assessment & Plan Note (Signed)
Blood pressure elevated above goal today with current dose of amlodipine. Continue to monitor with adjustments per primary team and neurology.

## 2021-05-12 NOTE — Assessment & Plan Note (Signed)
Left sided weakness appears to have improved following discharge and working with physical therapy. Continue management and reduction of risk factors per neurology

## 2021-05-12 NOTE — Assessment & Plan Note (Signed)
Has been sober since leaving the hospital per his reports. Encouraged to continue with sobriety and recommend counseling as needed for continued support.

## 2021-05-12 NOTE — Patient Instructions (Signed)
Nice to see you.  Continue to take your [place] daily as prescribed.  We will check your blood work today.  We will get your financial assistance established.  Plan for follow up 1 month or sooner if needed.   Have a great day and stay safe!

## 2021-05-13 ENCOUNTER — Encounter: Payer: Self-pay | Admitting: Family

## 2021-05-13 LAB — T-HELPER CELL (CD4) - (RCID CLINIC ONLY)
CD4 % Helper T Cell: 20 % — ABNORMAL LOW (ref 33–65)
CD4 T Cell Abs: 463 /uL (ref 400–1790)

## 2021-05-14 ENCOUNTER — Telehealth (HOSPITAL_COMMUNITY): Payer: Self-pay | Admitting: Pharmacist

## 2021-05-14 NOTE — Telephone Encounter (Signed)
Unable to reach patient, will try to follow up in a couple of days.

## 2021-05-19 ENCOUNTER — Telehealth (HOSPITAL_COMMUNITY): Payer: Self-pay | Admitting: Pharmacist

## 2021-05-19 NOTE — Telephone Encounter (Signed)
Unable to reach patient, will call again.

## 2021-05-20 ENCOUNTER — Telehealth (HOSPITAL_COMMUNITY): Payer: Self-pay | Admitting: Pharmacist

## 2021-05-20 NOTE — Telephone Encounter (Signed)
Third and final attempt to reach patient. 

## 2021-05-26 LAB — COMPREHENSIVE METABOLIC PANEL
AG Ratio: 0.8 (calc) — ABNORMAL LOW (ref 1.0–2.5)
ALT: 43 U/L (ref 9–46)
AST: 43 U/L — ABNORMAL HIGH (ref 10–35)
Albumin: 3.8 g/dL (ref 3.6–5.1)
Alkaline phosphatase (APISO): 68 U/L (ref 35–144)
BUN: 13 mg/dL (ref 7–25)
CO2: 27 mmol/L (ref 20–32)
Calcium: 9.4 mg/dL (ref 8.6–10.3)
Chloride: 102 mmol/L (ref 98–110)
Creat: 0.93 mg/dL (ref 0.70–1.33)
Globulin: 4.5 g/dL (calc) — ABNORMAL HIGH (ref 1.9–3.7)
Glucose, Bld: 93 mg/dL (ref 65–99)
Potassium: 4 mmol/L (ref 3.5–5.3)
Sodium: 136 mmol/L (ref 135–146)
Total Bilirubin: 0.6 mg/dL (ref 0.2–1.2)
Total Protein: 8.3 g/dL — ABNORMAL HIGH (ref 6.1–8.1)

## 2021-05-26 LAB — HIV GENOSURE(R) MG

## 2021-05-26 LAB — HIV RNA, RTPCR W/R GT (RTI, PI,INT)
HIV 1 RNA Quant: 1130 copies/mL — ABNORMAL HIGH
HIV-1 RNA Quant, Log: 3.05 Log copies/mL — ABNORMAL HIGH

## 2021-05-26 LAB — HIV-1 RNA, PCR (GRAPH) RFX/GENO EDI
HIV-1 RNA BY PCR: 98200 copies/mL
HIV-1 RNA Quant, Log: 4.992 log10copy/mL

## 2021-05-26 LAB — HIV-1 INTEGRASE GENOTYPE

## 2021-05-26 LAB — REFLEX TO GENOSURE(R) MG EDI: HIV GenoSure(R): 1

## 2021-05-26 LAB — QUANTIFERON-TB GOLD PLUS
Mitogen-NIL: 9.5 IU/mL
NIL: 0.06 IU/mL
QuantiFERON-TB Gold Plus: NEGATIVE
TB1-NIL: 0.01 IU/mL
TB2-NIL: <0 IU/mL

## 2021-05-26 LAB — HLA B*5701: HLA-B*5701 w/rflx HLA-B High: NEGATIVE

## 2021-05-26 LAB — HIV-1 GENOTYPE: HIV-1 Genotype: DETECTED — AB

## 2021-06-01 ENCOUNTER — Encounter: Payer: Self-pay | Admitting: Family

## 2021-06-04 ENCOUNTER — Other Ambulatory Visit (HOSPITAL_COMMUNITY): Payer: Self-pay

## 2021-06-07 ENCOUNTER — Other Ambulatory Visit (HOSPITAL_COMMUNITY): Payer: Self-pay

## 2021-06-10 ENCOUNTER — Other Ambulatory Visit: Payer: Self-pay

## 2021-06-10 ENCOUNTER — Ambulatory Visit: Payer: Self-pay | Admitting: Family

## 2021-06-10 ENCOUNTER — Ambulatory Visit: Payer: Self-pay

## 2021-06-16 ENCOUNTER — Other Ambulatory Visit: Payer: Self-pay

## 2021-06-16 ENCOUNTER — Encounter: Payer: Self-pay | Admitting: Family

## 2021-06-16 ENCOUNTER — Ambulatory Visit (INDEPENDENT_AMBULATORY_CARE_PROVIDER_SITE_OTHER): Payer: Self-pay | Admitting: Family

## 2021-06-16 VITALS — BP 149/78 | HR 80 | Wt 182.2 lb

## 2021-06-16 DIAGNOSIS — Z0000 Encounter for general adult medical examination without abnormal findings: Secondary | ICD-10-CM

## 2021-06-16 DIAGNOSIS — Z23 Encounter for immunization: Secondary | ICD-10-CM

## 2021-06-16 DIAGNOSIS — B20 Human immunodeficiency virus [HIV] disease: Secondary | ICD-10-CM

## 2021-06-16 DIAGNOSIS — L309 Dermatitis, unspecified: Secondary | ICD-10-CM | POA: Insufficient documentation

## 2021-06-16 MED ORDER — PREDNISONE 10 MG (21) PO TBPK: ORAL_TABLET | ORAL | 0 refills | Status: DC

## 2021-06-16 NOTE — Assessment & Plan Note (Signed)
[name] has signs/symptoms compatible with eczematous rash.  Start prednisone taper.  Recommend follow-up with PCP or dermatology if symptoms worsen or do not improve.

## 2021-06-16 NOTE — Patient Instructions (Signed)
Nice to see you.  We will check your lab work today.  Continue to take your medication daily as prescribed.  Prednisone has been sent to the pharmacy.  Plan for follow up in 1 month or sooner if needed with lab work on the same day.  Have a great day and stay safe!

## 2021-06-16 NOTE — Assessment & Plan Note (Signed)
Marcus Nichols appears to be doing well with his current dose of Biktarvy with no adverse side effects and good adherence.  No signs/symptoms of opportunistic infection or progressive HIV.  Reviewed lab work and discussed plan of care.  Check blood work today.  Continue current dose of Biktarvy.  He will need to renew his financial assistance.  Plan for follow-up in 1 month or sooner if needed with lab work on the same day.

## 2021-06-16 NOTE — Progress Notes (Signed)
Brief Narrative   Patient ID: Marcus Nichols, male    DOB: February 27, 1966, 55 y.o.   MRN: [id number]  Mr. Lia is a 55 y/o AA male diagnosed with HIV disease in 1999 with risk factor of drug use and heterosexual contact. No history of opportunistic infection. Genotype on 05/12/21 with Subtype B and no significant resistance patterns. Initial CD4 and viral load are unavailable.Previous ART experience with [facility], combivir, [employer] and now [employer].    Subjective:    Chief Complaint  Patient presents with   Follow-up    Declined condoms; no complaints; reports strokes x2 in May and June; has quit smoking due to this    HPI:  Marcus Nichols is a 55 y.o. male with HIV disease last seen on 05/12/2021 with poorly controlled virus having been off medication for several years and found to have viral load of 1130 and CD4 count of 463.  Genotype with subtype B and no significant resistance patterns present.  Started on [place].  Here today for routine follow-up.  Mr. Grasse has been taking his Biktarvy daily as prescribed with no adverse side effects or missed doses since his last office visit.  He is having a new onset rash that he describes as itchy and previously prescribed prednisone which helped take it away.  He has scaly patches located on his bilateral arms. Denies fevers, chills, night sweats, headaches, changes in vision, neck pain/stiffness, nausea, diarrhea, vomiting, or lesions.  Mr. Lobello has no problems obtaining medication from the pharmacy and is covered through [employer].  Denies feelings of being down, depressed, or hopeless recently.  No current recreational or illicit drug use, alcohol or tobacco.  Condoms offered and declined.  Healthcare maintenance due includes Prevnar.   Allergies  Allergen Reactions   Grass Pollen(K-O-R-T-Swt Vern) Rash      Outpatient Medications Prior to Visit  Medication Sig Dispense Refill   amLODipine (NORVASC) 5 MG tablet Take 1 tablet (5 mg total) by  mouth daily. 90 tablet 0   aspirin 81 MG EC tablet Take 1 tablet (81 mg total) by mouth daily. Swallow whole. 90 tablet 0   atorvastatin (LIPITOR) 80 MG tablet Take 1 tablet (80 mg total) by mouth daily. 90 tablet 0   bictegravir-emtricitabine-tenofovir AF (BIKTARVY) 50-200-25 MG TABS tablet Take 1 tablet by mouth daily. 30 tablet 3   clopidogrel (PLAVIX) 75 MG tablet Take 1 tablet (75 mg total) by mouth daily. 90 tablet 0   No facility-administered medications prior to visit.     Past Medical History:  Diagnosis Date   Asthma    Eczema    Enlarged prostate    HIV (human immunodeficiency virus infection) (HCC)    Humerus fracture    left    Inguinal hernia    Schizophrenia (HCC)    Sleep apnea    does not wear CPAP ( per spouse )     Past Surgical History:  Procedure Laterality Date   MULTIPLE TOOTH EXTRACTIONS     ORIF HUMERUS FRACTURE Left 03/07/2019   Procedure: OPEN REDUCTION INTERNAL FIXATION (ORIF) HUMERAL SHAFT FRACTURE;  Surgeon: Roby Lofts, MD;  Location: [facility] OR;  Service: Orthopedics;  Laterality: Left;      Review of Systems  Constitutional:  Negative for appetite change, chills, fatigue, fever and unexpected weight change.  Eyes:  Negative for visual disturbance.  Respiratory:  Negative for cough, chest tightness, shortness of breath and wheezing.   Cardiovascular:  Negative for chest pain and leg  swelling.  Gastrointestinal:  Negative for abdominal pain, constipation, diarrhea, nausea and vomiting.  Genitourinary:  Negative for dysuria, flank pain, frequency, genital sores, hematuria and urgency.  Skin:  Positive for rash.  Allergic/Immunologic: Negative for immunocompromised state.  Neurological:  Negative for dizziness and headaches.     Objective:    BP (!) 149/78   Pulse 80   Wt 182 lb 3.2 oz (82.6 kg)   BMI 25.41 kg/m  Nursing note and vital signs reviewed.  Physical Exam Constitutional:      General: He is not in acute distress.     Appearance: He is well-developed.  Eyes:     Conjunctiva/sclera: Conjunctivae normal.  Cardiovascular:     Rate and Rhythm: Normal rate and regular rhythm.     Heart sounds: Normal heart sounds. No murmur heard.   No friction rub. No gallop.  Pulmonary:     Effort: Pulmonary effort is normal. No respiratory distress.     Breath sounds: Normal breath sounds. No wheezing or rales.  Chest:     Chest wall: No tenderness.  Abdominal:     General: Bowel sounds are normal.     Palpations: Abdomen is soft.     Tenderness: There is no abdominal tenderness.  Musculoskeletal:     Cervical back: Neck supple.  Lymphadenopathy:     Cervical: No cervical adenopathy.  Skin:    General: Skin is warm and dry.     Findings: No rash.     Comments: Eczematous rash located on bilateral arms and chest  Neurological:     Mental Status: He is alert and oriented to person, place, and time.  Psychiatric:        Mood and Affect: Mood normal.     Depression screen [facility] 2/9 06/16/2021 05/12/2021  Decreased Interest 0 0  Down, Depressed, Hopeless 0 0  PHQ - 2 Score 0 0       Assessment & Plan:    Patient Active Problem List   Diagnosis Date Noted   Eczema 06/16/2021   Healthcare maintenance 05/12/2021   Hypertensive urgency 05/05/2021   Acute left-sided weakness 05/05/2021   Chest pain 05/05/2021   Alcohol abuse 05/05/2021   HIV (human immunodeficiency virus infection) (HCC) 05/05/2021   Tobacco use 05/05/2021   Polysubstance abuse (HCC) 05/05/2021   Paresthesia 05/05/2021   Schizophrenia (HCC) 05/05/2021   Right-sided headache 05/05/2021   Cerebral thrombosis with cerebral infarction 05/05/2021   Accelerated hypertension 03/20/2019   Opiate overdose (HCC) 03/19/2019   Leukocytosis 03/19/2019   Hypokalemia 03/19/2019   Left arm pain 03/19/2019   Elevated blood pressure reading 03/19/2019   Comminuted fracture of humerus, left, open, initial encounter 02/28/2019     Problem List Items  Addressed This Visit       Musculoskeletal and Integument   Eczema    Mr. Delorise Shiner has signs/symptoms compatible with eczematous rash.  Start prednisone taper.  Recommend follow-up with PCP or dermatology if symptoms worsen or do not improve.         Other   HIV (human immunodeficiency virus infection) (HCC) - Primary    Mr. Kelley appears to be doing well with his current dose of Biktarvy with no adverse side effects and good adherence.  No signs/symptoms of opportunistic infection or progressive HIV.  Reviewed lab work and discussed plan of care.  Check blood work today.  Continue current dose of Biktarvy.  He will need to renew his financial assistance.  Plan for follow-up in  1 month or sooner if needed with lab work on the same Nichols.       Relevant Orders   T-helper cell (CD4)- ([facility] clinic only)   HIV-1 RNA quant-no reflex-bld   Healthcare maintenance    Discussed importance of safe sexual practice to reduce risk of STI.  Condoms declined Prevnar updated today.         I am having [name] start on predniSONE. I am also having him maintain his aspirin, atorvastatin, clopidogrel, amLODipine, and bictegravir-emtricitabine-tenofovir AF.   Meds ordered this encounter  Medications   predniSONE (STERAPRED UNI-PAK 21 TAB) 10 MG (21) TBPK tablet    Sig: Take per package instructions.    Dispense:  21 tablet    Refill:  0    Order Specific Question:   Supervising Provider    Answer:   Judyann Munson [[id number]]     Follow-up: Return in about 1 month (around 07/17/2021), or if symptoms worsen or fail to improve.   Marcos Eke, MSN, FNP-C Nurse Practitioner [facility] for Infectious Disease [facility] Main number: [phone]

## 2021-06-16 NOTE — Assessment & Plan Note (Signed)
   Discussed importance of safe sexual practice to reduce risk of STI.  Condoms declined.  Prevnar updated today. 

## 2021-06-18 LAB — T-HELPER CELL (CD4) - (RCID CLINIC ONLY)
CD4 % Helper T Cell: 24 % — ABNORMAL LOW (ref 33–65)
CD4 T Cell Abs: 562 /uL (ref 400–1790)

## 2021-06-18 LAB — HIV-1 RNA QUANT-NO REFLEX-BLD
HIV 1 RNA Quant: <20 Copies/mL — ABNORMAL HIGH
HIV-1 RNA Quant, Log: <1.3 Log cps/mL — ABNORMAL HIGH

## 2021-06-21 ENCOUNTER — Ambulatory Visit: Payer: Self-pay | Attending: Internal Medicine | Admitting: Internal Medicine

## 2021-06-21 ENCOUNTER — Other Ambulatory Visit: Payer: Self-pay

## 2021-06-21 DIAGNOSIS — R739 Hyperglycemia, unspecified: Secondary | ICD-10-CM

## 2021-06-21 DIAGNOSIS — F1910 Other psychoactive substance abuse, uncomplicated: Secondary | ICD-10-CM

## 2021-06-21 DIAGNOSIS — I6930 Unspecified sequelae of cerebral infarction: Secondary | ICD-10-CM | POA: Insufficient documentation

## 2021-06-21 DIAGNOSIS — Z7689 Persons encountering health services in other specified circumstances: Secondary | ICD-10-CM

## 2021-06-21 DIAGNOSIS — I10 Essential (primary) hypertension: Secondary | ICD-10-CM

## 2021-06-21 NOTE — Progress Notes (Signed)
Virtual Visit via Telephone Note  I connected with Marcus Nichols on 06/21/2021 at 3:12 PM by telephone and verified that I am speaking with the correct person using two identifiers  Location: Patient: At a friend's house. Provider: office  Participants: Myself Patient   I discussed the limitations, risks, security and privacy concerns of performing an evaluation and management service by telephone and the availability of in person appointments. I also discussed with the patient that there may be a patient responsible charge related to this service. The patient expressed understanding and agreed to proceed.   History of Present Illness: Pt with history of HTN, CVA, polysubstance abuse (including EtOH, marijuana and cocaine), tobacco dependence, HIV, schizophrenia. This visit is to establish care with [place] posthospitalization.  No previous PCP. Patient hospitalized last month with acute left-sided weakness.  He was found to have acute CVA.  MRI of the brain showed acute infarct involving the right frontal/parietal lobes MRI spine: No acute abnormality MRA brain: No proximal intracranial stenosis Carotid Doppler: No significant stenosis Echo: EF of 60 to 65%, bubble study negative. LDL: 47 EKG sinus rhythm: Patient was placed on aspirin and Plavix.  Permissive hypertension was allowed initially then he was started on amlodipine and was discharged home on this.  He was advised to discontinue street drug use and was advised to quit smoking. There was some question of diabetes.  A1c was ordered but apparently there was not sufficient blood for the test to be run.  Today: He reports compliance with taking aspirin, Plavix and Norvasc.  No device to check blood pressure. -Still has some weakness in the left hand.  He would like to do physical therapy but no insurance.  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  He has not returned to work because he feels he would  not be able to do any lifting due to the weakness in his left hand.   -Has appointment with neurology on the third of next month but not sure he will go due to lack of insurance. -Reports he has been clean of street drugs and alcohol.  He would like to get into a treatment program.  For his HIV, he has established with ID clinic.  Reports compliance with taking Biktarvy.  Outpatient Encounter Medications as of 06/21/2021  Medication Sig   amLODipine (NORVASC) 5 MG tablet Take 1 tablet (5 mg total) by mouth daily.   aspirin 81 MG EC tablet Take 1 tablet (81 mg total) by mouth daily. Swallow whole.   atorvastatin (LIPITOR) 80 MG tablet Take 1 tablet (80 mg total) by mouth daily.   bictegravir-emtricitabine-tenofovir AF (BIKTARVY) 50-200-25 MG TABS tablet Take 1 tablet by mouth daily.   clopidogrel (PLAVIX) 75 MG tablet Take 1 tablet (75 mg total) by mouth daily.   predniSONE (STERAPRED UNI-PAK 21 TAB) 10 MG (21) TBPK tablet Take per package instructions.   [DISCONTINUED] gabapentin (NEURONTIN) 300 MG capsule Take 2 capsules (600 mg total) by mouth 2 (two) times daily. (Patient not taking: Reported on 03/28/2020)   [DISCONTINUED] losartan (COZAAR) 100 MG tablet Take 1 tablet (100 mg total) by mouth daily. (Patient not taking: Reported on 03/28/2020)   No facility-administered encounter medications on file as of 06/21/2021.      Observations/Objective: Lab Results  Component Value Date   CHOL 108 05/05/2021   HDL 41 05/05/2021   LDLCALC 47 05/05/2021   TRIG 100 05/05/2021   CHOLHDL 2.6 05/05/2021  Lab Results  Component Value Date   WBC 6.3 05/04/2021   HGB 16.7 05/04/2021   HCT 49.0 05/04/2021   MCV 91.7 05/04/2021   PLT 288 05/04/2021     Chemistry      Component Value Date/Time   NA 136 05/12/2021 1647   K 4.0 05/12/2021 1647   CL 102 05/12/2021 1647   CO2 27 05/12/2021 1647   BUN 13 05/12/2021 1647   CREATININE 0.93 05/12/2021 1647      Component Value Date/Time   CALCIUM  9.4 05/12/2021 1647   ALKPHOS 79 05/04/2021 1751   AST 43 (H) 05/12/2021 1647   ALT 43 05/12/2021 1647   BILITOT 0.6 05/12/2021 1647       Assessment and Plan: 1. Encounter to establish care   2. History of CVA with residual deficit Discussed the importance of secondary prevention including remaining free of street drugs, and tobacco.  Discussed the importance of good blood pressure control and cholesterol control Advised to stop Plavix but continue aspirin.. -Advised to apply for the orange card/cone discount card.  I told him how to go about doing that.  Once approved we can refer him for some physical therapy. - CBC; Future  3. Essential hypertension Continue amlodipine. - Basic Metabolic Panel; Future  4. Polysubstance abuse (HCC) See #1 above. Message sent to our social worker to see if she can assist with getting him into a treatment program.  5. Hyperglycemia - Hemoglobin A1c; Future - Basic Metabolic Panel; Future   Follow Up Instructions: 6 wks   I discussed the assessment and treatment plan with the patient. The patient was provided an opportunity to ask questions and all were answered. The patient agreed with the plan and demonstrated an understanding of the instructions.   The patient was advised to call back or seek an in-person evaluation if the symptoms worsen or if the condition fails to improve as anticipated.  I  Spent 14 minutes on this telephone encounter  Jonah Blue, MD

## 2021-06-22 ENCOUNTER — Telehealth: Payer: Self-pay

## 2021-06-22 NOTE — Telephone Encounter (Signed)
-----   Message from Veryl Speak, FNP sent at 06/21/2021  2:44 PM EDT ----- Please inform Mr. Albus that his viral load is undetectable and CD4 count is 562.

## 2021-06-22 NOTE — Telephone Encounter (Signed)
Contacted patient to relay test results. Patient requested prednisone prescription however provider sent in 7/20. Patient reports he hasn't received it. RN instructed patient to contact pharmacy.  Prajna Vanderpool Loyola Mast, RN

## 2021-06-25 ENCOUNTER — Telehealth (INDEPENDENT_AMBULATORY_CARE_PROVIDER_SITE_OTHER): Payer: Self-pay | Admitting: Clinical

## 2021-06-28 NOTE — Telephone Encounter (Signed)
I have his information and will fax it over tomorrow.

## 2021-06-30 ENCOUNTER — Encounter: Payer: Self-pay | Admitting: Adult Health

## 2021-06-30 ENCOUNTER — Inpatient Hospital Stay: Payer: Self-pay | Admitting: Adult Health

## 2021-06-30 NOTE — Progress Notes (Deleted)
[facility] [address] [place]. [place] [address] [phone]       HOSPITAL FOLLOW UP NOTE  [name] Date of Birth:  1966-06-05 Medical Record Number:  [id number]   Reason for Referral:  hospital stroke follow up    SUBJECTIVE:   CHIEF COMPLAINT:  No chief complaint on file.   HPI:   [name] is a 55 y.o. male w/pmh of HIV, schizophrenia, sleep apnea, enlarged prostate, asthma, smoking cigarettes and substance abuse, and eczema who presented with 05/04/2021 with 3-day history of headache with left-sided weakness and numbness.  Personally reviewed hospitalization pertinent progress notes, lab work and imaging with summary provided.  Evaluated by Dr. 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         ROS:   14 system review of systems performed and negative with exception of ***  PMH:  Past Medical History:  Diagnosis Date   Asthma    Eczema    Enlarged prostate    HIV (human immunodeficiency virus infection) (HCC)    Humerus fracture    left    Inguinal hernia    Schizophrenia (HCC)    Sleep apnea    does not wear CPAP ( per spouse )    PSH:  Past Surgical History:  Procedure Laterality Date   MULTIPLE TOOTH EXTRACTIONS     ORIF HUMERUS FRACTURE Left 03/07/2019   Procedure: OPEN REDUCTION INTERNAL FIXATION (ORIF) HUMERAL SHAFT FRACTURE;  Surgeon: Roby Lofts, MD;  Location: [facility] OR;  Service: Orthopedics;  Laterality: Left;    Social History:  Social History   Socioeconomic History   Marital status: Single    Spouse name: Not on file   Number of children: Not on file   Years of education: Not on file   Highest education level: Not on file  Occupational History   Not on file  Tobacco Use   Smoking status: Former    Packs/day: 1.00    Types:  Cigarettes   Smokeless tobacco: Never  Vaping Use   Vaping Use: Never used  Substance and Sexual Activity   Alcohol use: Yes    Comment: pt. stated that he had some "hennessey"   Drug use: Yes    Types: Cocaine, Marijuana    Comment: + Cocaine and +THC   Sexual activity: Not on file  Other Topics Concern   Not on file  Social History Narrative   ** Merged History Encounter **       Social Determinants of Health   Financial Resource Strain: Not on file  Food Insecurity: Not on file  Transportation Needs: Not on file  Physical Activity: Not on file  Stress: Not on file  Social Connections: Not on file  Intimate Partner Violence: Not on file    Family History:  Family History  Problem Relation Age of Onset   Hypertension Mother    Congestive Heart Failure Mother     Medications:   Current Outpatient Medications on File Prior to Visit  Medication Sig Dispense Refill   amLODipine (NORVASC) 5 MG tablet Take 1 tablet (5 mg total) by mouth daily. 90 tablet 0   aspirin 81 MG EC tablet Take 1 tablet (81 mg total) by mouth daily. Swallow whole. 90 tablet 0   atorvastatin (LIPITOR) 80 MG tablet Take 1 tablet (80 mg total) by mouth  daily. 90 tablet 0   bictegravir-emtricitabine-tenofovir AF (BIKTARVY) 50-200-25 MG TABS tablet Take 1 tablet by mouth daily. 30 tablet 3   predniSONE (STERAPRED UNI-PAK 21 TAB) 10 MG (21) TBPK tablet Take per package instructions. 21 tablet 0   [DISCONTINUED] gabapentin (NEURONTIN) 300 MG capsule Take 2 capsules (600 mg total) by mouth 2 (two) times daily. (Patient not taking: Reported on 03/28/2020) 60 capsule 1   [DISCONTINUED] losartan (COZAAR) 100 MG tablet Take 1 tablet (100 mg total) by mouth daily. (Patient not taking: Reported on 03/28/2020) 90 tablet 3   No current facility-administered medications on file prior to visit.    Allergies:   Allergies  Allergen Reactions   Grass Pollen(K-O-R-T-Swt Vern) Rash      OBJECTIVE:  Physical  Exam  There were no vitals filed for this visit. There is no height or weight on file to calculate BMI. No results found.  Depression screen [facility] 2/9 06/16/2021  Decreased Interest 0  Down, Depressed, Hopeless 0  PHQ - 2 Score 0     General: well developed, well nourished, seated, in no evident distress Head: head normocephalic and atraumatic.   Neck: supple with no carotid or supraclavicular bruits Cardiovascular: regular rate and rhythm, no murmurs Musculoskeletal: no deformity Skin:  no rash/petichiae Vascular:  Normal pulses all extremities   Neurologic Exam Mental Status: Awake and fully alert. Oriented to place and time. Recent and remote memory intact. Attention span, concentration and fund of knowledge appropriate. Mood and affect appropriate.  Cranial Nerves: Fundoscopic exam reveals sharp disc margins. Pupils equal, briskly reactive to light. Extraocular movements full without nystagmus. Visual fields full to confrontation. Hearing intact. Facial sensation intact. Face, tongue, palate moves normally and symmetrically.  Motor: Normal bulk and tone. Normal strength in all tested extremity muscles Sensory.: intact to touch , pinprick , position and vibratory sensation.  Coordination: Rapid alternating movements normal in all extremities. Finger-to-nose and heel-to-shin performed accurately bilaterally. Gait and Station: Arises from chair without difficulty. Stance is normal. Gait demonstrates normal stride length and balance with ***. Tandem walk and heel toe ***.  Reflexes: 1+ and symmetric. Toes downgoing.     NIHSS  *** Modified Rankin  ***      ASSESSMENT: [name] is a 56 y.o. year old male presented with *** on *** secondary to ***. Vascular risk factors include ***.      PLAN:  *** : Residual deficit: ***. Continue {anticoagulants:31417}  and ***  for secondary stroke prevention.  Discussed secondary stroke prevention measures and importance of close PCP  follow up for aggressive stroke risk factor management. I have gone over the pathophysiology of stroke, warning signs and symptoms, risk factors and their management in some detail with instructions to go to the closest emergency room for symptoms of concern. HTN: BP goal <130/90.  Stable on *** per PCP HLD: LDL goal <70. Recent LDL ***.  DMII: A1c goal<7.0. Recent A1c ***.     Follow up in *** or call earlier if needed   CC:  [facility] provider: Dr. Pearlean Brownie PCP: Marcine Matar, MD    I spent *** minutes of face-to-face and non-face-to-face time with patient.  This included previsit chart review, lab review, study review, order entry, electronic health record documentation, patient education regarding recent stroke, residual deficits, importance of managing stroke risk factors and answered all questions to patient satisfaction     Ihor Austin, [facility]  [facility] [address] [place], [place] [address]  Phone [phone] Fax [phone] Note: This document was prepared with digital dictation and possible smart phrase technology. Any transcriptional errors that result from this process are unintentional.

## 2021-07-08 ENCOUNTER — Other Ambulatory Visit (HOSPITAL_COMMUNITY): Payer: Self-pay

## 2021-07-09 NOTE — Telephone Encounter (Signed)
Do  I need to do anything else for this pt?

## 2021-07-12 ENCOUNTER — Telehealth: Payer: Self-pay

## 2021-07-12 NOTE — Telephone Encounter (Signed)
Following up on a request from patient on detox treatment. Patient shared with his current life stressors that he is not able to commit to treatment at this time and would cal back when he was.

## 2021-07-14 NOTE — Telephone Encounter (Signed)
Thanks for this.

## 2021-08-03 ENCOUNTER — Other Ambulatory Visit: Payer: Self-pay

## 2021-08-03 ENCOUNTER — Ambulatory Visit (INDEPENDENT_AMBULATORY_CARE_PROVIDER_SITE_OTHER): Payer: Self-pay | Admitting: Family

## 2021-08-03 ENCOUNTER — Encounter: Payer: Self-pay | Admitting: Family

## 2021-08-03 VITALS — BP 139/79 | HR 73 | Temp 97.9°F | Wt 187.0 lb

## 2021-08-03 DIAGNOSIS — Z0000 Encounter for general adult medical examination without abnormal findings: Secondary | ICD-10-CM

## 2021-08-03 DIAGNOSIS — Z23 Encounter for immunization: Secondary | ICD-10-CM

## 2021-08-03 DIAGNOSIS — Z21 Asymptomatic human immunodeficiency virus [HIV] infection status: Secondary | ICD-10-CM

## 2021-08-03 MED ORDER — BICTEGRAVIR-EMTRICITAB-TENOFOV 50-200-25 MG PO TABS: 1.0000 | ORAL_TABLET | Freq: Every day | ORAL | 3 refills | Status: DC

## 2021-08-03 NOTE — Patient Instructions (Signed)
Nice to see you.  We will check your lab work today.  Continue to take your medication daily.  Refills have been sent to the pharmacy.  Plan for follow up in 3 months or sooner if needed with lab work on the same day  Have a great day and stay safe!

## 2021-08-03 NOTE — Progress Notes (Signed)
Brief Narrative   Patient ID: Marcus Nichols, male    DOB: [birth year], 55 y.o.   MRN: [id number]  [name] is a 55 y/o AA male diagnosed with HIV disease in 1999 with risk factor of drug use and heterosexual contact. No history of opportunistic infection. Genotype on 05/12/21 with Subtype B and no significant resistance patterns. Initial CD4 and viral load are unavailable.Previous ART experience with Sustiva, combivir, Gen[employer] and now [employer].    Subjective:    No chief complaint on file.   HPI:  Marcus Nichols is a 55 y.o. male with HIV disease last seen on 06/16/2021 with good adherence and tolerance to his ART regimen of Biktarvy.  Viral load was undetectable and CD4 count was 562.  Here today for routine follow-up.  [name] has been taking his Biktarvy daily as prescribed with no adverse side effects.  Overall feeling well today with no new concerns/complaints. Denies fevers, chills, night sweats, headaches, changes in vision, neck pain/stiffness, nausea, diarrhea, vomiting, lesions or rashes.  [name] has no problems obtaining medication from the pharmacy and is covered through [employer].  Denies feelings of being down, depressed, or hopeless recently.  No current alcohol consumption, recreational or illicit drug use, and about 3 to 4 cigarettes/day on average.  Smoking is new and has restarted secondary to stress/anxiety.  Condoms offered and declined.  Healthcare maintenance due includes Pneumovax.   Allergies  Allergen Reactions   Grass Pollen(K-O-R-T-Swt Vern) Rash    Outpatient Medications Prior to Visit  Medication Sig Dispense Refill   amLODipine (NORVASC) 5 MG tablet Take 1 tablet (5 mg total) by mouth daily. 90 tablet 0   aspirin 81 MG EC tablet Take 1 tablet (81 mg total) by mouth daily. Swallow whole. 90 tablet 0   atorvastatin (LIPITOR) 80 MG tablet Take 1 tablet (80 mg total) by mouth daily. 90 tablet 0   bictegravir-emtricitabine-tenofovir AF (BIKTARVY) 50-200-25 MG  TABS tablet Take 1 tablet by mouth daily. 30 tablet 3   predniSONE (STERAPRED UNI-PAK 21 TAB) 10 MG (21) TBPK tablet Take per package instructions. (Patient not taking: Reported on 08/03/2021) 21 tablet 0   No facility-administered medications prior to visit.     Past Medical History:  Diagnosis Date   Asthma    Eczema    Enlarged prostate    HIV (human immunodeficiency virus infection) (HCC)    Humerus fracture    left    Inguinal hernia    Schizophrenia (HCC)    Sleep apnea    does not wear CPAP ( per spouse )     Past Surgical History:  Procedure Laterality Date   MULTIPLE TOOTH EXTRACTIONS     ORIF HUMERUS FRACTURE Left 03/07/2019   Procedure: OPEN REDUCTION INTERNAL FIXATION (ORIF) HUMERAL SHAFT FRACTURE;  Surgeon: Roby Lofts, MD;  Location: [facility] OR;  Service: Orthopedics;  Laterality: Left;      Review of Systems  Constitutional:  Negative for appetite change, chills, fatigue, fever and unexpected weight change.  Eyes:  Negative for visual disturbance.  Respiratory:  Negative for cough, chest tightness, shortness of breath and wheezing.   Cardiovascular:  Negative for chest pain and leg swelling.  Gastrointestinal:  Negative for abdominal pain, constipation, diarrhea, nausea and vomiting.  Genitourinary:  Negative for dysuria, flank pain, frequency, genital sores, hematuria and urgency.  Skin:  Negative for rash.  Allergic/Immunologic: Negative for immunocompromised state.  Neurological:  Negative for dizziness and headaches.     Objective:  BP 139/79   Pulse 73   Temp 97.9 F (36.6 C) (Oral)   Wt 187 lb (84.8 kg)   BMI 26.08 kg/m  Nursing note and vital signs reviewed.  Physical Exam Constitutional:      General: He is not in acute distress.    Appearance: He is well-developed.  Eyes:     Conjunctiva/sclera: Conjunctivae normal.  Cardiovascular:     Rate and Rhythm: Normal rate and regular rhythm.     Heart sounds: Normal heart sounds. No murmur  heard.   No friction rub. No gallop.  Pulmonary:     Effort: Pulmonary effort is normal. No respiratory distress.     Breath sounds: Normal breath sounds. No wheezing or rales.  Chest:     Chest wall: No tenderness.  Abdominal:     General: Bowel sounds are normal.     Palpations: Abdomen is soft.     Tenderness: There is no abdominal tenderness.  Musculoskeletal:     Cervical back: Neck supple.  Lymphadenopathy:     Cervical: No cervical adenopathy.  Skin:    General: Skin is warm and dry.     Findings: No rash.  Neurological:     Mental Status: He is alert and oriented to person, place, and time.  Psychiatric:        Behavior: Behavior normal.        Thought Content: Thought content normal.        Judgment: Judgment normal.     Depression screen [facility] 2/9 08/03/2021 06/16/2021 05/12/2021  Decreased Interest 0 0 0  Down, Depressed, Hopeless 0 0 0  PHQ - 2 Score 0 0 0       Assessment & Plan:    Patient Active Problem List   Diagnosis Date Noted   History of CVA with residual deficit 06/21/2021   Hyperglycemia 06/21/2021   Eczema 06/16/2021   Healthcare maintenance 05/12/2021   Hypertensive urgency 05/05/2021   Acute left-sided weakness 05/05/2021   Chest pain 05/05/2021   Alcohol abuse 05/05/2021   HIV (human immunodeficiency virus infection) (HCC) 05/05/2021   Tobacco use 05/05/2021   Polysubstance abuse (HCC) 05/05/2021   Paresthesia 05/05/2021   Schizophrenia (HCC) 05/05/2021   Right-sided headache 05/05/2021   Cerebral thrombosis with cerebral infarction 05/05/2021   Essential hypertension 03/20/2019   Opiate overdose (HCC) 03/19/2019   Leukocytosis 03/19/2019   Hypokalemia 03/19/2019   Left arm pain 03/19/2019   Elevated blood pressure reading 03/19/2019   Comminuted fracture of humerus, left, open, initial encounter 02/28/2019     Problem List Items Addressed This Visit       Other   HIV (human immunodeficiency virus infection) (HCC) - Primary     [name] has well-controlled virus and good adherence and tolerance to his ART regimen of Biktarvy.  No signs/symptoms of opportunistic infection or progressive HIV.  We reviewed previous lab work and discussed plan of care.  Check blood work today.  Continue current dose of Biktarvy.  Plan for follow-up in 3 months or sooner if needed with lab work on the same day.      Relevant Medications   bictegravir-emtricitabine-tenofovir AF (BIKTARVY) 50-200-25 MG TABS tablet   Other Relevant Orders   HIV-1 RNA quant-no reflex-bld   T-helper cell (CD4)- ([facility] clinic only)   Healthcare maintenance    Discussed importance of safe sexual practice to reduce risk of STI.  Condoms declined. Pneumovax updated today.      Other Visit Diagnoses  Need for pneumococcal vaccination       Relevant Orders   Pneumococcal polysaccharide vaccine 23-valent greater than or equal to 2yo subcutaneous/IM (Completed)        I have discontinued Marcus Nichols's predniSONE. I am also having him maintain his aspirin, atorvastatin, amLODipine, and bictegravir-emtricitabine-tenofovir AF.   Meds ordered this encounter  Medications   bictegravir-emtricitabine-tenofovir AF (BIKTARVY) 50-200-25 MG TABS tablet    Sig: Take 1 tablet by mouth daily.    Dispense:  30 tablet    Refill:  3    BIN: [id number] PCN: [id number] Group: [id number] Member: [id number]    Order Specific Question:   Supervising Provider    Answer:   Judyann Munson [[id number]]     Follow-up: Return in about 3 months (around 11/02/2021), or if symptoms worsen or fail to improve.   Marcos Eke, MSN, FNP-C Nurse Practitioner [facility] Main number: [phone]

## 2021-08-04 ENCOUNTER — Encounter: Payer: Self-pay | Admitting: Family

## 2021-08-04 LAB — T-HELPER CELL (CD4) - (RCID CLINIC ONLY)
CD4 % Helper T Cell: 26 % — ABNORMAL LOW (ref 33–65)
CD4 T Cell Abs: 476 /uL (ref 400–1790)

## 2021-08-04 NOTE — Assessment & Plan Note (Signed)
   Discussed importance of safe sexual practice to reduce risk of STI.  Condoms declined.  Pneumovax updated today.

## 2021-08-04 NOTE — Assessment & Plan Note (Signed)
Mr. Laboy has well-controlled virus and good adherence and tolerance to his ART regimen of Biktarvy.  No signs/symptoms of opportunistic infection or progressive HIV.  We reviewed previous lab work and discussed plan of care.  Check blood work today.  Continue current dose of Biktarvy.  Plan for follow-up in 3 months or sooner if needed with lab work on the same day.

## 2021-08-05 ENCOUNTER — Telehealth: Payer: Self-pay

## 2021-08-05 LAB — HIV-1 RNA QUANT-NO REFLEX-BLD
HIV 1 RNA Quant: 43 Copies/mL — ABNORMAL HIGH
HIV-1 RNA Quant, Log: 1.63 Log cps/mL — ABNORMAL HIGH

## 2021-08-05 NOTE — Telephone Encounter (Signed)
-----   Message from Veryl Speak, FNP sent at 08/05/2021 10:41 AM EDT ----- Please inform Mr. Paci that his viral load remains undetectable and CD4 count is 476.

## 2021-08-05 NOTE — Telephone Encounter (Signed)
Relayed to patient that his viral load is undetectable and CD4 count is 476. Patient verbalized understanding and has no further questions.   Sandie Ano, RN

## 2021-08-06 ENCOUNTER — Other Ambulatory Visit: Payer: Self-pay | Admitting: Family

## 2021-08-13 IMAGING — MR MR HEAD WO/W CM
14 of 16 series · 41 of 48 positions shown · IV contrast (gadavist)
Comparison: Prior CT from 05/04/2021.

CLINICAL DATA: Initial evaluation for acute weakness.

EXAM:
MRI HEAD WITHOUT AND WITH CONTRAST
MRI CERVICAL SPINE WITHOUT AND WITH CONTRAST
TECHNIQUE: Multiplanar, multiecho pulse sequences of the brain and surrounding
structures, and cervical spine, to include the craniocervical
junction and cervicothoracic junction, were obtained without and
with intravenous contrast.
CONTRAST:  8mL GADAVIST GADOBUTROL 1 MMOL/ML IV SOLN

[Series 5: DWI · axial · 3.0mm · 0.88mm/px · z∈[-90,+74]mm · 6 of 112 slices shown (1 of 4)]
[im 1/112]
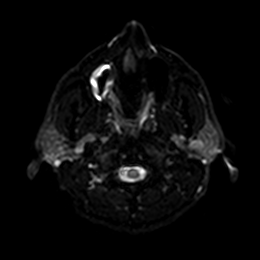
[im 23/112]
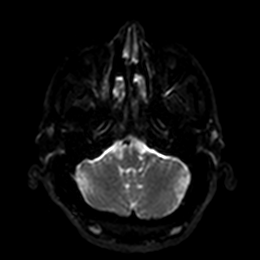
[im 45/112]
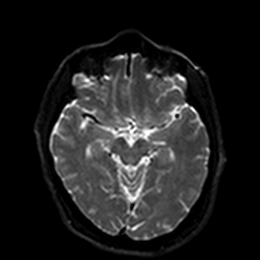
[im 67/112]
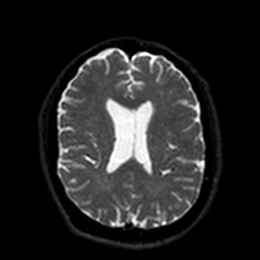
[im 89/112]
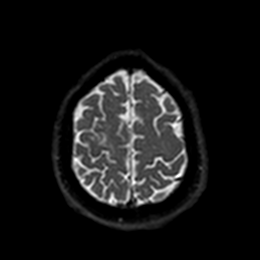
[im 112/112]
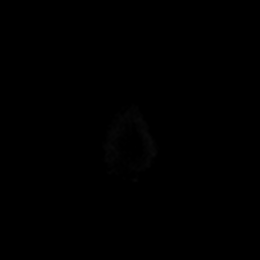

[Series 6: DWI · axial · 3.0mm · 0.88mm/px · z∈[-90,+74]mm · 2 of 51 slices shown (2 of 4)]
[im 1/51]
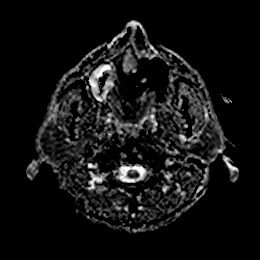
[im 51/51]
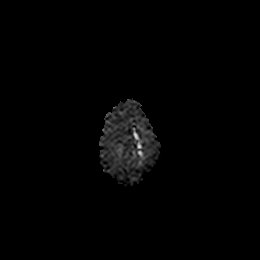

[Series 7: DWI · coronal · 4.0mm · 0.88mm/px · 4 of 74 slices shown (3 of 4)]
[im 1/74]
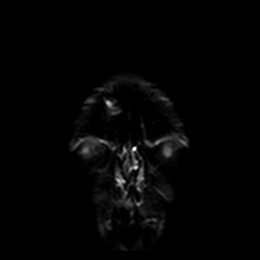
[im 25/74]
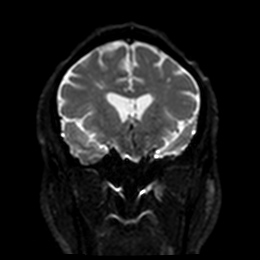
[im 49/74]
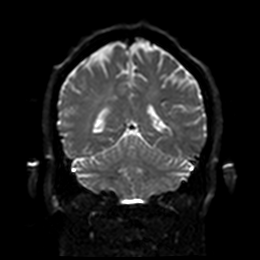
[im 74/74]
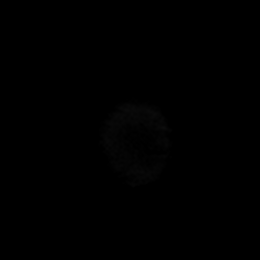

[Series 8: DWI · coronal · 4.0mm · 0.88mm/px · 2 of 37 slices shown (4 of 4)]
[im 1/37]
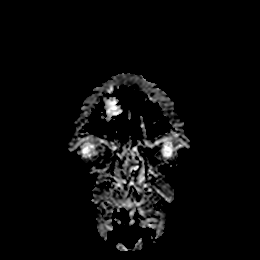
[im 37/37]
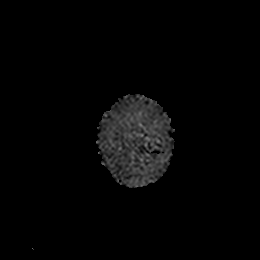

[Series 9: T1 · sagittal · 5.0mm · 0.75mm/px · 2 of 26 slices shown]
[im 1/26]
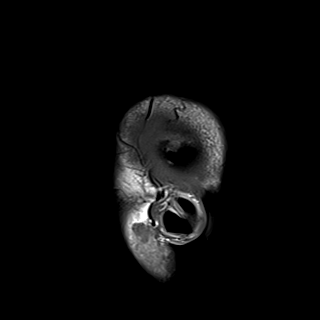
[im 26/26]
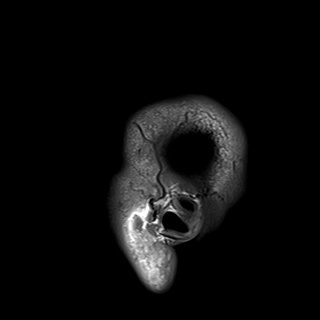

[Series 10: T2 · axial · 5.0mm · 0.72mm/px · z∈[-89,+72]mm · 2 of 28 slices shown]
[im 1/28]
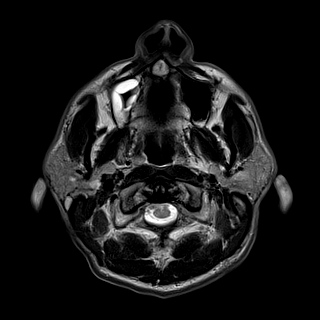
[im 28/28]
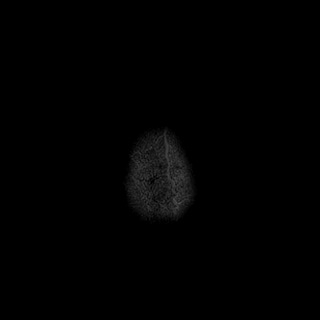

[Series 11: FLAIR · axial · 5.0mm · 0.45mm/px · z∈[-87,+74]mm · 2 of 28 slices shown]
[im 1/28]
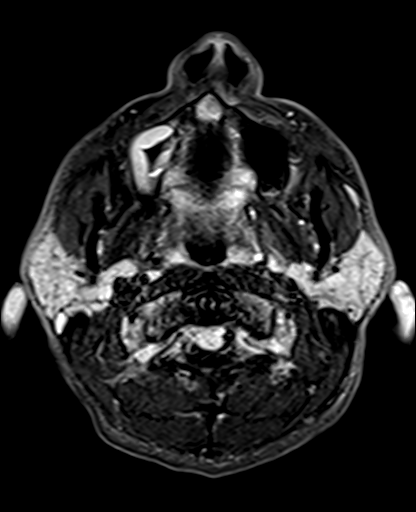
[im 28/28]
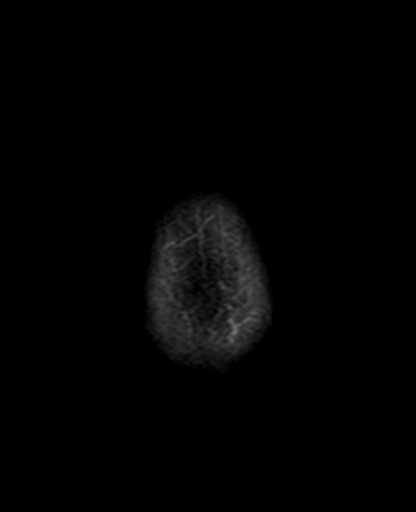

[Series 12: mag_images · axial · 3.0mm · 0.90mm/px · z∈[-94,+82]mm · 4 of 60 slices shown]
[im 1/60]
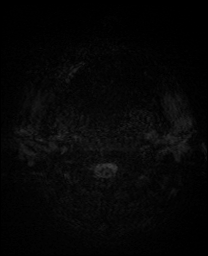
[im 20/60]
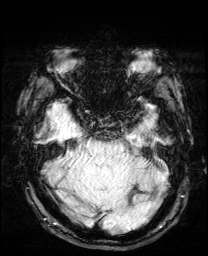
[im 40/60]
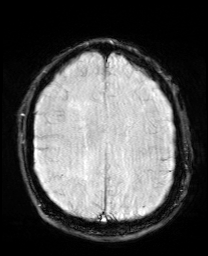
[im 60/60]
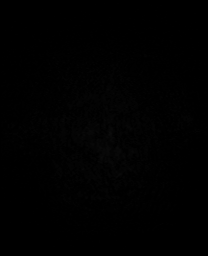

[Series 13: pha_images · axial · 3.0mm · 0.90mm/px · z∈[-94,+79]mm · 4 of 58 slices shown]
[im 1/58]
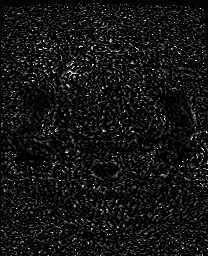
[im 20/58]
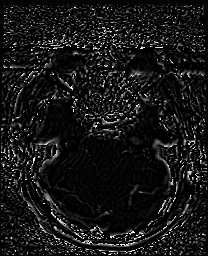
[im 39/58]
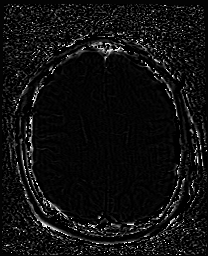
[im 58/58]
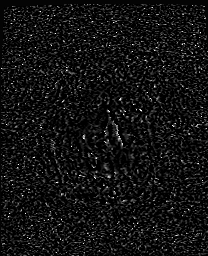

[Series 14: swi_images · axial · 3.0mm · 0.90mm/px · z∈[-94,+82]mm · 4 of 60 slices shown]
[im 1/60]
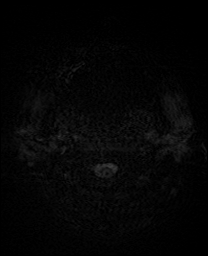
[im 20/60]
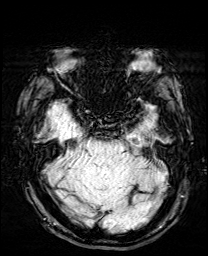
[im 40/60]
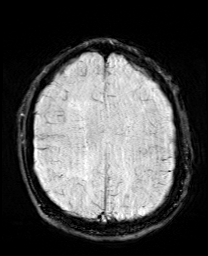
[im 60/60]
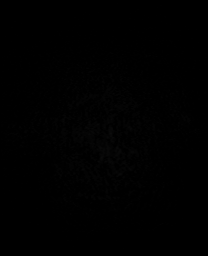

[Series 15: mip_images(sw) · axial · 24.0mm · 0.90mm/px · z∈[-84,+71]mm · 3 of 53 slices shown]
[im 1/53]
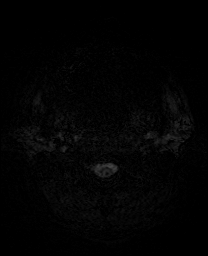
[im 27/53]
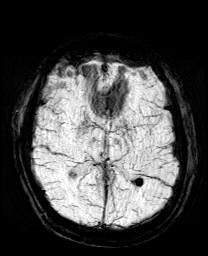
[im 53/53]
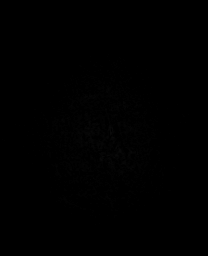

[Series 37: T2 post-contrast · coronal · 5.0mm · 0.72mm/px · 2 of 30 slices shown]
[im 1/30]
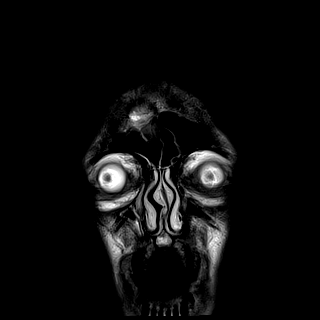
[im 30/30]
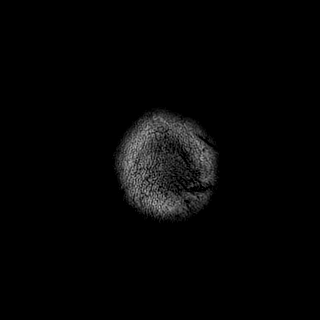

[Series 39: T1 post-contrast · coronal · 5.0mm · 0.34mm/px · 2 of 30 slices shown (1 of 2)]
[im 1/30]
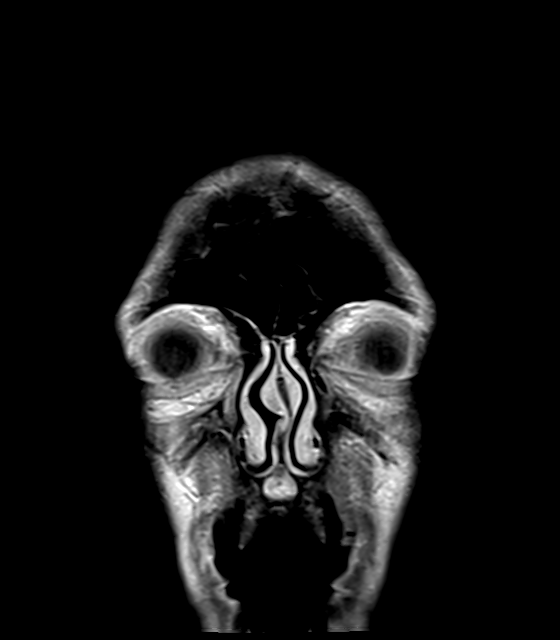
[im 30/30]
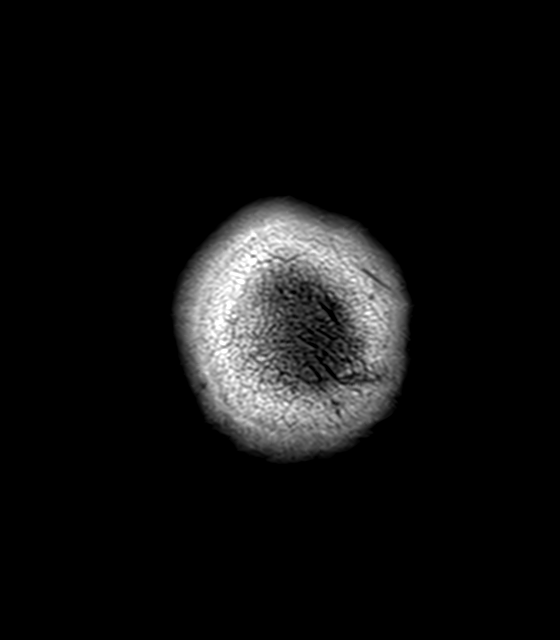

[Series 40: T1 post-contrast · sagittal · 5.0mm · 0.72mm/px · 2 of 26 slices shown (2 of 2)]
[im 1/26]
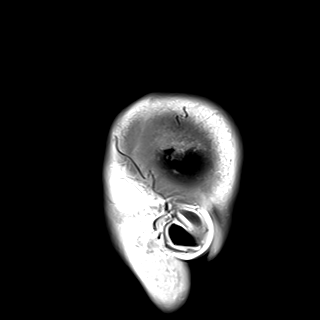
[im 26/26]
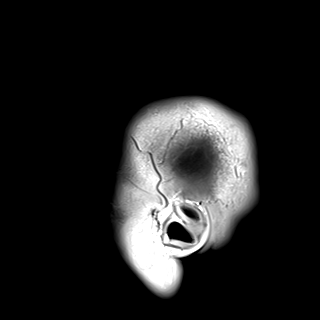

[41 of 48 positions shown; findings below may reference images not displayed]

FINDINGS: MRI HEAD FINDINGS

Brain: Cerebral volume within normal limits for age. Scattered
patchy T2/FLAIR hyperintensity within the periventricular and deep
white matter both cerebral hemispheres most consistent with chronic
small vessel ischemic disease, mild in nature.

Patchy restricted diffusion seen extending in a somewhat linear
oblique fashion across the right frontal and parietal lobes, with
both cortical and subcortical involvement (series 5, images 105, 99,
95, 92). Finding consistent with acute ischemic infarcts. Associated
patchy post-contrast enhancement likely reflects breakdown of the
blood-brain barrier. No significant regional mass effect. No
associated hemorrhage. These infarcts are watershed in distribution.

No other evidence for acute or subacute infarct. Gray-white matter
differentiation otherwise maintained. No other areas of remote
cortical infarction. No other evidence for acute or chronic
intracranial hemorrhage.

No mass lesion, midline shift or mass effect. No hydrocephalus or
extra-axial fluid collection. Pituitary gland suprasellar region
normal. Midline structures intact. No other abnormal enhancement.

Vascular: Major intracranial vascular flow voids are maintained.

Skull and upper cervical spine: Chiari 1 malformation with the
cerebellar tonsils beaked and extending up to 1 cm below the foramen
magnum. Diffusely decreased signal intensity seen within the bone
marrow of the upper cervical spine, nonspecific, but most commonly
related to anemia, smoking, or obesity. No focal marrow replacing
lesion. No scalp soft tissue abnormality.

Sinuses/Orbits: Globes and orbital soft tissues within normal
limits. Scattered mucosal thickening noted within the ethmoidal air
cells and maxillary sinuses. Superimposed small right maxillary
sinus retention cyst. No mastoid effusion. Inner ear structures
grossly normal.

Other: Few scattered subcentimeter cystic lesions noted about the
parotid glands bilaterally, nonspecific, but suspected to be related
to history of HIV.

MRI CERVICAL SPINE FINDINGS

Alignment: Straightening of the normal cervical lordosis. No
listhesis.

Vertebrae: Vertebral body height maintained without acute or chronic
fracture. Bone marrow signal intensity diffusely decreased on T1
weighted imaging, nonspecific, but most commonly related to anemia,
smoking, or obesity. No discrete or worrisome osseous lesions. No
abnormal marrow edema or enhancement.

Cord: Normal signal and morphology.

Posterior Fossa, vertebral arteries, paraspinal tissues: Chiari 1
malformation again noted. Craniocervical junction otherwise
unremarkable. Paraspinous and prevertebral soft tissues within
normal limits. Normal flow voids seen within the vertebral arteries
bilaterally. Few scattered subcentimeter cystic lesions noted within
the parotid glands bilaterally, nonspecific, but suspected to be
related history of HIV.

Disc levels:

C2-C3: Mild disc bulge with uncovertebral hypertrophy. No
significant spinal stenosis. Mild right C3 foraminal narrowing. Left
neural foramina remains patent.

C3-C4: Disc bulge with bilateral uncovertebral hypertrophy.
Posterior disc osteophyte flattens and partially effaces the ventral
thecal sac. Minimal flattening of the ventral cord without cord
signal changes. Mild spinal stenosis. Moderate right with mild to
moderate left C4 foraminal stenosis.

C4-C5: Mild disc bulge with uncovertebral hypertrophy. Flattening of
the ventral thecal sac without significant spinal stenosis. Mild
left C5 foraminal stenosis. Right neural foramina remains patent.

C5-C6: Mild disc bulge with uncovertebral spurring. Superimposed
tiny central disc protrusion minimally indents the ventral thecal
sac. No significant spinal stenosis. Mild right C6 foraminal
narrowing. Left neural foramina remains patent.

C6-C7: Mild disc bulge with right worse than left uncovertebral
spurring. No significant spinal stenosis. Moderate right C7
foraminal stenosis. Left neural foramina remains patent.

C7-T1: Minimal disc bulge. Mild facet and ligament flavum
hypertrophy. No significant spinal stenosis. Foramina remain patent.

Visualized upper thoracic spine demonstrates no significant finding.
IMPRESSION: MRI HEAD IMPRESSION:

1. Patchy acute ischemic nonhemorrhagic infarcts involving the right
frontal and parietal lobes, largely watershed in distribution. No
associated hemorrhage or significant mass effect.
2. Underlying mild chronic microvascular ischemic disease.
3. Chiari 1 malformation with the cerebellar tonsils extending up to
1 cm below the foramen magnum.

MRI CERVICAL SPINE IMPRESSION:

1. No acute abnormality within the cervical spine.
2. Mild multilevel cervical spondylosis as above. Mild spinal
stenosis at the C3-4 level. No other significant canal narrowing or
evidence for cord impingement.
3. Moderate right C4 and C7 foraminal narrowing related to disc
bulge and uncovertebral disease.

## 2021-08-16 ENCOUNTER — Other Ambulatory Visit: Payer: Self-pay

## 2021-08-16 ENCOUNTER — Other Ambulatory Visit (HOSPITAL_COMMUNITY): Payer: Self-pay

## 2021-08-17 ENCOUNTER — Ambulatory Visit: Payer: Self-pay | Admitting: Internal Medicine

## 2021-08-17 ENCOUNTER — Other Ambulatory Visit (HOSPITAL_COMMUNITY): Payer: Self-pay

## 2021-08-17 ENCOUNTER — Other Ambulatory Visit: Payer: Self-pay | Admitting: Internal Medicine

## 2021-08-17 NOTE — Telephone Encounter (Signed)
Requested medication (s) are due for refill today: yes  Requested medication (s) are on the active medication list: amlodipine expired 08/12/21 (historical med) Atorvastatin: yes  Last refill:  05/06/21 for both  Future visit scheduled: yes  Notes to clinic:  Amlodipine was ordered at hospital discharge which expired 08/12/21 Atorvastatin also prescribed at hospital d/c   Requested Prescriptions  Pending Prescriptions Disp Refills   amLODipine (NORVASC) 5 MG tablet 90 tablet 0    Sig: Take 1 tablet (5 mg total) by mouth daily.     Cardiovascular:  Calcium Channel Blockers Passed - 08/17/2021  2:27 PM      Passed - Last BP in normal range    BP Readings from Last 1 Encounters:  08/03/21 139/79          Passed - Valid encounter within last 6 months    Recent Outpatient Visits           1 month ago Encounter to establish care   [facility] Marcine Matar, MD       Future Appointments             In 1 month Marcine Matar, MD [facility]             atorvastatin (LIPITOR) 80 MG tablet 90 tablet 0    Sig: Take 1 tablet (80 mg total) by mouth daily.     Cardiovascular:  Antilipid - Statins Passed - 08/17/2021  2:27 PM      Passed - Total Cholesterol in normal range and within 360 days    Cholesterol  Date Value Ref Range Status  05/05/2021 108 0 - 200 mg/dL Final          Passed - LDL in normal range and within 360 days    LDL Cholesterol  Date Value Ref Range Status  05/05/2021 47 0 - 99 mg/dL Final    Comment:           Total Cholesterol/HDL:CHD Risk Coronary Heart Disease Risk Table                     Men   Women  1/2 Average Risk   3.4   3.3  Average Risk       5.0   4.4  2 X Average Risk   9.6   7.1  3 X Average Risk  23.4   11.0        Use the calculated Patient Ratio above and the CHD Risk Table to determine the patient's CHD Risk.        ATP III CLASSIFICATION (LDL):  <100      mg/dL   Optimal  778-242  mg/dL   Near or Above                    Optimal  130-159  mg/dL   Borderline  353-614  mg/dL   High  >431     mg/dL   Very High Performed at [facility], [address] [place], [place] [address]           Passed - HDL in normal range and within 360 days    HDL  Date Value Ref Range Status  05/05/2021 41 >40 mg/dL Final          Passed - Triglycerides in normal range and within 360 days    Triglycerides  Date Value Ref  Range Status  05/05/2021 100 <150 mg/dL Final          Passed - Patient is not pregnant      Passed - Valid encounter within last 12 months    Recent Outpatient Visits           1 month ago Encounter to establish care   [facility] Marcine Matar, MD       Future Appointments             In 1 month Laural Benes Binnie Rail, MD [facility]

## 2021-08-17 NOTE — Telephone Encounter (Signed)
Medication Refill - Medication:  atorvastatin (LIPITOR) 80 MG tablet  amLODipine (NORVASC) 5 MG tablet  Has the patient contacted their pharmacy? No.  Preferred Pharmacy (with phone number or street name):    [id number] - [place], [place] - [address]  Phone:  [phone] Fax:  [phone]  Has the patient been seen for an appointment in the last year OR does the patient have an upcoming appointment? Yes.    Agent: Please be advised that RX refills may take up to 3 business days. We ask that you follow-up with your pharmacy.

## 2021-08-18 ENCOUNTER — Other Ambulatory Visit: Payer: Self-pay

## 2021-08-18 ENCOUNTER — Other Ambulatory Visit (HOSPITAL_COMMUNITY): Payer: Self-pay

## 2021-08-18 NOTE — Telephone Encounter (Signed)
Patient called in to inform Dr Laural Benes that he have not had his medications in about 4 days and feel like he is getting the run around. Please advise

## 2021-08-19 ENCOUNTER — Other Ambulatory Visit (HOSPITAL_COMMUNITY): Payer: Self-pay

## 2021-08-19 MED ORDER — ATORVASTATIN CALCIUM 80 MG PO TABS
80.0000 mg | ORAL_TABLET | Freq: Every day | ORAL | 0 refills | Status: DC
Start: 2021-08-19 — End: 2021-08-20

## 2021-08-19 MED ORDER — AMLODIPINE BESYLATE 5 MG PO TABS: 5.0000 mg | ORAL_TABLET | Freq: Every day | ORAL | 0 refills | Status: DC

## 2021-08-20 ENCOUNTER — Other Ambulatory Visit: Payer: Self-pay

## 2021-08-20 ENCOUNTER — Telehealth: Payer: Self-pay | Admitting: Internal Medicine

## 2021-08-20 MED ORDER — ATORVASTATIN CALCIUM 80 MG PO TABS
80.0000 mg | ORAL_TABLET | Freq: Every day | ORAL | 0 refills | Status: DC
  Filled 2021-08-20: qty 30, 30d supply, fill #0

## 2021-08-20 MED ORDER — AMLODIPINE BESYLATE 5 MG PO TABS
5.0000 mg | ORAL_TABLET | Freq: Every day | ORAL | 0 refills | Status: DC
  Filled 2021-08-20: qty 30, 30d supply, fill #0

## 2021-08-20 NOTE — Telephone Encounter (Signed)
Contacted pt to make aware of rxs being sent to the pharmacy pt didn't answer lvm  

## 2021-08-25 ENCOUNTER — Other Ambulatory Visit (HOSPITAL_COMMUNITY): Payer: Self-pay

## 2021-08-27 ENCOUNTER — Other Ambulatory Visit: Payer: Self-pay

## 2021-09-29 ENCOUNTER — Other Ambulatory Visit: Payer: Self-pay

## 2021-09-29 ENCOUNTER — Ambulatory Visit: Payer: Self-pay

## 2021-10-05 ENCOUNTER — Other Ambulatory Visit: Payer: Self-pay | Admitting: Pharmacist

## 2021-10-05 DIAGNOSIS — B20 Human immunodeficiency virus [HIV] disease: Secondary | ICD-10-CM

## 2021-10-05 MED ORDER — BICTEGRAVIR-EMTRICITAB-TENOFOV 50-200-25 MG PO TABS: 1.0000 | ORAL_TABLET | Freq: Every day | ORAL | 0 refills | Status: AC

## 2021-10-05 NOTE — Progress Notes (Signed)
Medication Samples have been provided to the patient.  Drug name: Biktarvy        Strength: 50/200/25 mg       Qty: 14 tablets (2 bottles) LOT: CHSYVB   Exp.Date: 6/24  Dosing instructions: Take one tablet by mouth once daily  The patient has been instructed regarding the correct time, dose, and frequency of taking this medication, including desired effects and most common side effects.   Margarite Gouge, PharmD, CPP Clinical Pharmacist Practitioner Infectious Diseases Clinical Pharmacist [facility]

## 2021-10-07 ENCOUNTER — Encounter: Payer: Self-pay | Admitting: Internal Medicine

## 2021-10-07 ENCOUNTER — Other Ambulatory Visit: Payer: Self-pay

## 2021-10-07 ENCOUNTER — Ambulatory Visit: Payer: Self-pay | Attending: Internal Medicine | Admitting: Internal Medicine

## 2021-10-07 VITALS — BP 150/88 | HR 80 | Resp 16 | Ht 74.0 in | Wt 185.0 lb

## 2021-10-07 DIAGNOSIS — I10 Essential (primary) hypertension: Secondary | ICD-10-CM

## 2021-10-07 DIAGNOSIS — Z8673 Personal history of transient ischemic attack (TIA), and cerebral infarction without residual deficits: Secondary | ICD-10-CM

## 2021-10-07 DIAGNOSIS — Z5320 Procedure and treatment not carried out because of patient's decision for unspecified reasons: Secondary | ICD-10-CM

## 2021-10-07 DIAGNOSIS — F1921 Other psychoactive substance dependence, in remission: Secondary | ICD-10-CM

## 2021-10-07 DIAGNOSIS — Z2821 Immunization not carried out because of patient refusal: Secondary | ICD-10-CM

## 2021-10-07 DIAGNOSIS — F17200 Nicotine dependence, unspecified, uncomplicated: Secondary | ICD-10-CM

## 2021-10-07 DIAGNOSIS — R739 Hyperglycemia, unspecified: Secondary | ICD-10-CM

## 2021-10-07 MED ORDER — AMLODIPINE BESYLATE 10 MG PO TABS
10.0000 mg | ORAL_TABLET | Freq: Every day | ORAL | 6 refills | Status: DC
  Filled 2021-10-07 – 2021-10-20 (×2): qty 30, 30d supply, fill #0
  Filled 2021-11-17: qty 30, 30d supply, fill #1
  Filled 2021-12-14: qty 30, 30d supply, fill #0
  Filled 2022-03-16: qty 30, 30d supply, fill #1
  Filled 2022-04-15: qty 30, 30d supply, fill #2
  Filled 2022-05-23: qty 30, 30d supply, fill #3
  Filled 2022-06-24: qty 30, 30d supply, fill #4

## 2021-10-07 MED ORDER — NICOTINE 21 MG/24HR TD PT24
21.0000 mg | MEDICATED_PATCH | Freq: Every day | TRANSDERMAL | 1 refills | Status: DC
  Filled 2021-10-07 – 2021-10-20 (×2): qty 28, 28d supply, fill #0

## 2021-10-07 MED ORDER — ATORVASTATIN CALCIUM 80 MG PO TABS
80.0000 mg | ORAL_TABLET | Freq: Every day | ORAL | 6 refills | Status: DC
  Filled 2021-10-07 – 2021-10-20 (×2): qty 30, 30d supply, fill #0
  Filled 2021-11-17: qty 30, 30d supply, fill #1
  Filled 2021-12-14: qty 30, 30d supply, fill #0
  Filled 2022-02-04: qty 30, 30d supply, fill #1
  Filled 2022-03-16: qty 30, 30d supply, fill #2
  Filled 2022-04-15: qty 30, 30d supply, fill #3
  Filled 2022-05-23: qty 30, 30d supply, fill #4

## 2021-10-07 NOTE — Progress Notes (Signed)
Patient ID: Marcus Nichols, male    DOB: Feb 13, 1966  MRN: [id number]  CC: Chronic disease management.   Subjective: Marcus Nichols is a 55 y.o. male who presents for chronic disease management. 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  Polysubstance abuse:  spoke with LCSW and CW since last visit.  Referred [place]hab but [place] states [place] and he could not go -reports he has been free of ETOH and drugs since his stroke  CVA: turned down for disability.  Now working on trying to get Medicaid. -taking Norvasc and took already today.  Taking Lipitor.  Not sure he has the Plavix.  Plan was to continue ASA and Plavix for 3 wks then Plavix alone.  Not taking Plavix only ASA.   Grandmother died recently.  Started smoking more - now  1/2 pk a day.  Started  smoking at age 43.  Feels He was supposed to come to the lab to have blood test done including CBC and A1c after last telephone visit with me in July.  He had hyperglycemia on blood test.  He declines going to the lab today to have these blood test done.  HIV: reports compliance with Biktarvy. Followed by ID  HM: He declines flu shot.  He declines colon cancer screening.  Patient Active Problem List   Diagnosis Date Noted   History of CVA with residual deficit 06/21/2021   Hyperglycemia 06/21/2021   Eczema 06/16/2021   Healthcare maintenance 05/12/2021   Hypertensive urgency 05/05/2021   Acute left-sided weakness 05/05/2021   Chest pain 05/05/2021   Alcohol abuse 05/05/2021   HIV (human immunodeficiency virus infection) (HCC) 05/05/2021   Tobacco use 05/05/2021   Polysubstance abuse (HCC) 05/05/2021   Paresthesia 05/05/2021   Schizophrenia (HCC) 05/05/2021   Right-sided headache 05/05/2021   Cerebral thrombosis with cerebral infarction 05/05/2021   Essential hypertension  03/20/2019   Opiate overdose (HCC) 03/19/2019   Leukocytosis 03/19/2019   Hypokalemia 03/19/2019   Left arm pain 03/19/2019   Elevated blood pressure reading 03/19/2019   Comminuted fracture of humerus, left, open, initial encounter 02/28/2019     Current Outpatient Medications on File Prior to Visit  Medication Sig Dispense Refill   amLODipine (NORVASC) 5 MG tablet Take 1 tablet (5 mg total) by mouth daily. 90 tablet 0   aspirin 81 MG EC tablet Take 1 tablet (81 mg total) by mouth daily. Swallow whole. 90 tablet 0   atorvastatin (LIPITOR) 80 MG tablet Take 1 tablet (80 mg total) by mouth daily. 90 tablet 0   bictegravir-emtricitabine-tenofovir AF (BIKTARVY) 50-200-25 MG TABS tablet Take 1 tablet by mouth daily. 30 tablet 3   bictegravir-emtricitabine-tenofovir AF (BIKTARVY) 50-200-25 MG TABS tablet Take 1 tablet by mouth daily for 14 days. 14 tablet 0   [DISCONTINUED] gabapentin (NEURONTIN) 300 MG capsule Take 2 capsules (600 mg total) by mouth 2 (two) times daily. (Patient not taking: Reported on 03/28/2020) 60 capsule 1   [DISCONTINUED] losartan (COZAAR) 100 MG tablet Take 1 tablet (100 mg total) by mouth daily. (Patient not taking: Reported on 03/28/2020) 90 tablet 3   No current facility-administered medications on file prior to visit.    Allergies  Allergen Reactions   Grass Pollen(K-O-R-T-Swt Vern) Rash    Social History   Socioeconomic History   Marital status: Single    Spouse name: Not on  file   Number of children: Not on file   Years of education: Not on file   Highest education level: Not on file  Occupational History   Not on file  Tobacco Use   Smoking status: Every Day    Packs/day: 1.00    Types: Cigarettes   Smokeless tobacco: Never  Vaping Use   Vaping Use: Never used  Substance and Sexual Activity   Alcohol use: Yes    Comment: pt. stated that he had some "hennessey"   Drug use: Not Currently    Types: Cocaine, Marijuana    Comment: + Cocaine and +THC    Sexual activity: Not Currently    Comment: declined condoms  Other Topics Concern   Not on file  Social History Narrative   ** Merged History Encounter **       Social Determinants of Health   Financial Resource Strain: Not on file  Food Insecurity: Not on file  Transportation Nichols: Not on file  Physical Activity: Not on file  Stress: Not on file  Social Connections: Not on file  Intimate Partner Violence: Not on file    Family History  Problem Relation Age of Onset   Hypertension Mother    Congestive Heart Failure Mother     Past Surgical History:  Procedure Laterality Date   MULTIPLE TOOTH EXTRACTIONS     ORIF HUMERUS FRACTURE Left 03/07/2019   Procedure: OPEN REDUCTION INTERNAL FIXATION (ORIF) HUMERAL SHAFT FRACTURE;  Surgeon: Roby Lofts, MD;  Location: [facility] OR;  Service: Orthopedics;  Laterality: Left;    ROS: Review of Systems Negative except as stated above  PHYSICAL EXAM: BP (!) 150/88   Pulse 80   Resp 16   Ht 6\' 2"  (1.88 m)   Wt 185 lb (83.9 kg)   SpO2 99%   BMI 23.75 kg/m   Wt Readings from Last 3 Encounters:  10/07/21 185 lb (83.9 kg)  08/03/21 187 lb (84.8 kg)  06/16/21 182 lb 3.2 oz (82.6 kg)   BP 161/92 Physical Exam   General appearance - alert, well appearing, and in no distress Mental status - normal mood, behavior, speech, dress, motor activity, and thought processes Neck - supple, no significant adenopathy Chest - clear to auscultation, no wheezes, rales or rhonchi, symmetric air entry Heart - normal rate, regular rhythm, normal S1, S2, no murmurs, rubs, clicks or gallops Neurological -cranial nerves grossly intact.  Grip 5/5 bilaterally.  Power 5/5 bilaterally proximally and distally in the upper and lower extremities. Extremities - peripheral pulses normal, no pedal edema, no clubbing or cyanosis  CMP Latest Ref Rng & Units 05/12/2021 05/04/2021 05/04/2021  Glucose 65 - 99 mg/dL 93 07/04/2021) 032(Z)  BUN 7 - 25 mg/dL 13 5(L) 7   Creatinine 0.70 - 1.33 mg/dL 224(M 2.50 0.37  Sodium 135 - 146 mmol/L 136 140 136  Potassium 3.5 - 5.3 mmol/L 4.0 4.2 4.6  Chloride 98 - 110 mmol/L 102 105 106  CO2 20 - 32 mmol/L 27 - 23  Calcium 8.6 - 10.3 mg/dL 9.4 - 8.3(L)  Total Protein 6.1 - 8.1 g/dL 8.3(H) - 7.3  Total Bilirubin 0.2 - 1.2 mg/dL 0.6 - 0.9  Alkaline Phos 38 - 126 U/L - - 79  AST 10 - 35 U/L 43(H) - 48(H)  ALT 9 - 46 U/L 43 - 29   Lipid Panel     Component Value Date/Time   CHOL 108 05/05/2021 0330   TRIG 100 05/05/2021 0330  HDL 41 05/05/2021 0330   CHOLHDL 2.6 05/05/2021 0330   VLDL 20 05/05/2021 0330   LDLCALC 47 05/05/2021 0330    CBC    Component Value Date/Time   WBC 6.3 05/04/2021 1751   RBC 5.43 05/04/2021 1751   HGB 16.7 05/04/2021 1803   HCT 49.0 05/04/2021 1803   PLT 288 05/04/2021 1751   MCV 91.7 05/04/2021 1751   MCH 30.4 05/04/2021 1751   MCHC 33.1 05/04/2021 1751   RDW 13.8 05/04/2021 1751   LYMPHSABS 2.2 05/04/2021 1751   MONOABS 0.4 05/04/2021 1751   EOSABS 1.5 (H) 05/04/2021 1751   BASOSABS 0.0 05/04/2021 1751    ASSESSMENT AND PLAN: 1. Essential hypertension Not at goal.  Increase amlodipine to 10 mg daily.  Follow-up with clinical pharmacist in 2 weeks for repeat blood pressure check. He declines going to the lab today to have blood test done that were ordered on his telephone visit with me back in July. - amLODipine (NORVASC) 10 MG tablet; Take 1 tablet (10 mg total) by mouth daily.  Dispense: 30 tablet; Refill: 6  2. Tobacco dependence Strongly advised to quit smoking to prevent further cardiovascular events.  He is wanting to quit and will give a trial of the nicotine patch.  I went over with him how to use the patch. - nicotine (NICODERM CQ - DOSED IN MG/24 HOURS) 21 mg/24hr patch; Place 1 patch (21 mg total) onto the skin daily.  Dispense: 28 patch; Refill: 1  3. History of CVA (cerebrovascular accident) It seems that his residual weakness in the left hand has  resolved. Patient unable to afford Plavix I will have him continue aspirin, atorvastatin and amlodipine. - atorvastatin (LIPITOR) 80 MG tablet; Take 1 tablet (80 mg total) by mouth daily.  Dispense: 30 tablet; Refill: 6  4. Polysubstance dependence, non-opioid, in remission (HCC) Commended him on quitting.  Encouraged him to remain free of alcohol and street drugs.  5. Influenza vaccination declined Recommended.  Patient declined.  6. Hyperglycemia Declined having blood test today.  7. Colon cancer screening declined    Patient was given the opportunity to ask questions.  Patient verbalized understanding of the plan and was able to repeat key elements of the plan.   No orders of the defined types were placed in this encounter.    Requested Prescriptions    No prescriptions requested or ordered in this encounter    No follow-ups on file.  Jonah Blue, MD, FACP

## 2021-10-14 ENCOUNTER — Other Ambulatory Visit: Payer: Self-pay

## 2021-10-20 ENCOUNTER — Other Ambulatory Visit: Payer: Self-pay

## 2021-10-25 NOTE — Progress Notes (Signed)
   S:    Patient arrives in good spirits. Presents to the clinic for hypertension evaluation, counseling, and management. Patient was referred and last seen by Primary Care Provider, Dr. Laural Benes, on 10/07/2021 where amlodipine dose was increased due to persistently elevated blood pressure.  Medication adherence reported. Patient did report hesitancy in taking multiple medications per day and attributes medications use to his recent stroke. Patient was educated on the benefit medications have on secondary prevention of ASCVD.  Current BP Medications include:   -Amlodipine 10 mg QD  Antihypertensives tried in the past include:  -Losartan 100 mg: stopped by patient according to chart review, but patient does not recall a reason for discontinuation  Dietary habits include: Candy, ice cream, watermelon. Reports unhealthy intake of sugars with limited fiber and protein. Exercise habits include:none  Family / Social history:  -Mother: HTN, CHF  -Current smoker, attempting to quit. -Reports marijuana use -Denies other illicit substance use  ASCVD risk factors include: HTN, stroke, smoking  O:  Vitals:   10/26/21 1633  BP: (!) 166/84  Pulse: 76    Home BP readings: none. Patient reported willingness to begin measuring blood pressure at home.  Last 3 Office BP readings: BP Readings from Last 3 Encounters:  10/07/21 (!) 150/88  08/03/21 139/79  06/16/21 (!) 149/78    BMET    Component Value Date/Time   NA 136 05/12/2021 1647   K 4.0 05/12/2021 1647   CL 102 05/12/2021 1647   CO2 27 05/12/2021 1647   GLUCOSE 93 05/12/2021 1647   BUN 13 05/12/2021 1647   CREATININE 0.93 05/12/2021 1647   CALCIUM 9.4 05/12/2021 1647   GFRNONAA >60 05/04/2021 1751   GFRAA 54 (L) 03/28/2020 1855    Renal function: CrCl cannot be calculated (Patient's most recent lab result is older than the maximum 21 days allowed.).  Clinical ASCVD: Yes  The ASCVD Risk score (Arnett DK, et al., 2019) failed  to calculate for the following reasons:   The patient has a prior MI or stroke diagnosis   A/P: Hypertension longstanding, currently uncontrolled on current medications. BP Goal = < 130/80 mmHg. Medication adherence reported.  -Continue amlodipine 10 mg daily -Start hydrochlorothiazide 12.5 mg daily for hypertension management with history of stroke  -Administered Flu shot in clinic today upon patient request -F/u labs ordered - labs collected today. Repeat CMP in 2 weeks at next follow up -Counseled on lifestyle modifications for blood pressure control including reduced dietary sodium, increased exercise, adequate sleep.  Results reviewed and written information provided. Total time in face-to-face counseling 30 minutes. F/U Clinic Visit with CPP on 11/10/21 and PCP on 02/04/22.  Thank you for allowing pharmacy to participate in this patient's care.  Enos Fling, PharmD PGY1 Pharmacy Resident 10/26/2021 4:41 PM

## 2021-10-26 ENCOUNTER — Other Ambulatory Visit: Payer: Self-pay

## 2021-10-26 ENCOUNTER — Ambulatory Visit: Payer: Self-pay | Attending: Family Medicine | Admitting: Pharmacist

## 2021-10-26 VITALS — BP 166/84 | HR 76

## 2021-10-26 DIAGNOSIS — I10 Essential (primary) hypertension: Secondary | ICD-10-CM

## 2021-10-26 DIAGNOSIS — Z23 Encounter for immunization: Secondary | ICD-10-CM

## 2021-10-26 MED ORDER — HYDROCHLOROTHIAZIDE 12.5 MG PO TABS
12.5000 mg | ORAL_TABLET | Freq: Every day | ORAL | 3 refills | Status: DC
  Filled 2021-10-26: qty 30, 30d supply, fill #0
  Filled 2021-11-17: qty 30, 30d supply, fill #1
  Filled 2021-12-14: qty 30, 30d supply, fill #0
  Filled 2022-02-04: qty 30, 30d supply, fill #1
  Filled 2022-03-16: qty 30, 30d supply, fill #2
  Filled 2022-04-15: qty 30, 30d supply, fill #3
  Filled 2022-05-23 – 2022-06-24 (×3): qty 30, 30d supply, fill #4

## 2021-10-27 ENCOUNTER — Other Ambulatory Visit: Payer: Self-pay | Admitting: Internal Medicine

## 2021-10-27 ENCOUNTER — Telehealth: Payer: Self-pay

## 2021-10-27 DIAGNOSIS — R7989 Other specified abnormal findings of blood chemistry: Secondary | ICD-10-CM

## 2021-10-27 LAB — CMP14+EGFR
ALT: 67 IU/L — ABNORMAL HIGH (ref 0–44)
AST: 45 IU/L — ABNORMAL HIGH (ref 0–40)
Albumin/Globulin Ratio: 1.4 (ref 1.2–2.2)
Albumin: 4.7 g/dL (ref 3.8–4.9)
Alkaline Phosphatase: 78 IU/L (ref 44–121)
BUN/Creatinine Ratio: 8 — ABNORMAL LOW (ref 9–20)
BUN: 7 mg/dL (ref 6–24)
Bilirubin Total: 0.8 mg/dL (ref 0.0–1.2)
CO2: 27 mmol/L (ref 20–29)
Calcium: 9.8 mg/dL (ref 8.7–10.2)
Chloride: 105 mmol/L (ref 96–106)
Creatinine, Ser: 0.9 mg/dL (ref 0.76–1.27)
Globulin, Total: 3.3 g/dL (ref 1.5–4.5)
Glucose: 84 mg/dL (ref 70–99)
Potassium: 4.3 mmol/L (ref 3.5–5.2)
Sodium: 143 mmol/L (ref 134–144)
Total Protein: 8 g/dL (ref 6.0–8.5)
eGFR: 100 mL/min/{1.73_m2} (ref >59)

## 2021-10-27 NOTE — Progress Notes (Signed)
Kidney function normal.  He has mild elevation in 2 of his liver function tests.  This is most likely due to the atorvastatin.  However given the elevation, I would also like to screen him for hepatitis C.  He should return to the lab at his earliest convenience to have this done.  Avoid drinking excess alcohol while on the atorvastatin.

## 2021-10-27 NOTE — Telephone Encounter (Signed)
Contacted pt to go over lab results pt is aware and pt has been schedule for a lab visit

## 2021-10-28 ENCOUNTER — Ambulatory Visit: Payer: Self-pay | Attending: Internal Medicine

## 2021-10-28 ENCOUNTER — Other Ambulatory Visit: Payer: Self-pay

## 2021-10-28 DIAGNOSIS — I6930 Unspecified sequelae of cerebral infarction: Secondary | ICD-10-CM

## 2021-10-28 DIAGNOSIS — R7989 Other specified abnormal findings of blood chemistry: Secondary | ICD-10-CM

## 2021-10-28 DIAGNOSIS — R739 Hyperglycemia, unspecified: Secondary | ICD-10-CM

## 2021-10-28 DIAGNOSIS — I10 Essential (primary) hypertension: Secondary | ICD-10-CM

## 2021-10-29 LAB — CBC
Hematocrit: 45.7 % (ref 37.5–51.0)
Hemoglobin: 15.7 g/dL (ref 13.0–17.7)
MCH: 30.3 pg (ref 26.6–33.0)
MCHC: 34.4 g/dL (ref 31.5–35.7)
MCV: 88 fL (ref 79–97)
Platelets: 279 10*3/uL (ref 150–450)
RBC: 5.18 x10E6/uL (ref 4.14–5.80)
RDW: 12.5 % (ref 11.6–15.4)
WBC: 6.3 10*3/uL (ref 3.4–10.8)

## 2021-10-29 LAB — BASIC METABOLIC PANEL
BUN/Creatinine Ratio: 9 (ref 9–20)
BUN: 8 mg/dL (ref 6–24)
CO2: 26 mmol/L (ref 20–29)
Calcium: 9.9 mg/dL (ref 8.7–10.2)
Chloride: 101 mmol/L (ref 96–106)
Creatinine, Ser: 0.89 mg/dL (ref 0.76–1.27)
Glucose: 122 mg/dL — ABNORMAL HIGH (ref 70–99)
Potassium: 4.5 mmol/L (ref 3.5–5.2)
Sodium: 141 mmol/L (ref 134–144)
eGFR: 101 mL/min/{1.73_m2} (ref >59)

## 2021-10-29 LAB — HEMOGLOBIN A1C
Est. average glucose Bld gHb Est-mCnc: 117 mg/dL
Hgb A1c MFr Bld: 5.7 % — ABNORMAL HIGH (ref 4.8–5.6)

## 2021-10-29 LAB — HEPATITIS C ANTIBODY: Hep C Virus Ab: 0.2 s/co ratio (ref 0.0–0.9)

## 2021-10-30 NOTE — Progress Notes (Signed)
Let patient know that screening test for hepatitis C is negative.  Kidney function is normal.  Blood cell count is normal.  He has prediabetes.  Healthy eating habits and regular exercise will help prevent progression to full diabetes.

## 2021-11-01 ENCOUNTER — Telehealth: Payer: Self-pay

## 2021-11-01 NOTE — Telephone Encounter (Signed)
Contacted pt to go over lab results pt is aware and doesn't have any questions or concerns 

## 2021-11-08 ENCOUNTER — Other Ambulatory Visit: Payer: Self-pay

## 2021-11-08 DIAGNOSIS — B20 Human immunodeficiency virus [HIV] disease: Secondary | ICD-10-CM

## 2021-11-08 DIAGNOSIS — Z113 Encounter for screening for infections with a predominantly sexual mode of transmission: Secondary | ICD-10-CM

## 2021-11-09 ENCOUNTER — Other Ambulatory Visit: Payer: Self-pay

## 2021-11-09 NOTE — Progress Notes (Signed)
° °  S:    PCP: Dr. Laural Benes   Patient arrives in good spirits. Presents to the clinic for hypertension evaluation, counseling, and management. Patient was referred and last seen by Primary Care Provider, Dr. Laural Benes, on 10/07/2021 where amlodipine dose was increased due to persistently elevated blood pressure. Seen by CPP 11/29, BP elevated and HCTZ started.   Today, pt reports compliance with the newly-added HCTZ. Denies any side effects. Took this and amlodipine this morning.   Current BP Medications include:   -Amlodipine 10 mg QD, hydrochlorothiazide 12.5 mg daily  Antihypertensives tried in the past include:  -Losartan 100 mg: stopped by patient according to chart review, but patient does not recall a reason for discontinuation  Dietary habits include: tries to comply with sodium restriction. Denies excessive intake of caffeine.  Exercise habits include: none  Family / Social history:  -Mother: HTN, CHF -Current smoker, attempting to quit. -Reports marijuana use -Denies other illicit substance use  ASCVD risk factors include: HTN, stroke, smoking  O:  Vitals:   11/10/21 1453  BP: (!) 144/72    Home BP readings: none. Patient reported willingness to begin measuring blood pressure at home.  Last 3 Office BP readings: BP Readings from Last 3 Encounters:  11/10/21 (!) 144/72  10/26/21 (!) 166/84  10/07/21 (!) 150/88    BMET    Component Value Date/Time   NA 141 10/28/2021 1352   K 4.5 10/28/2021 1352   CL 101 10/28/2021 1352   CO2 26 10/28/2021 1352   GLUCOSE 122 (H) 10/28/2021 1352   GLUCOSE 93 05/12/2021 1647   BUN 8 10/28/2021 1352   CREATININE 0.89 10/28/2021 1352   CREATININE 0.93 05/12/2021 1647   CALCIUM 9.9 10/28/2021 1352   GFRNONAA >60 05/04/2021 1751   GFRAA 54 (L) 03/28/2020 1855    Renal function: CrCl cannot be calculated (Unknown ideal weight.).  Clinical ASCVD: Yes  The ASCVD Risk score (Arnett DK, et al., 2019) failed to calculate for the  following reasons:   The patient has a prior MI or stroke diagnosis   A/P: Hypertension longstanding, currently above goal but improved on current medications. BP Goal = < 130/80 mmHg. Medication adherence reported. Offered to increase HCTZ to 25 mg but pt would prefer to wait. Will have him return in 1 month for BP check and labs.   -Continue amlodipine 10 mg daily -Continue hydrochlorothiazide 12.5 mg daily  -F/u labs ordered - BMP at next visit -Counseled on lifestyle modifications for blood pressure control including reduced dietary sodium, increased exercise, adequate sleep.  Total time in face-to-face counseling 30 minutes. F/U Clinic Visit with CPP on 11/10/21 and PCP on 02/04/22.

## 2021-11-10 ENCOUNTER — Encounter: Payer: Self-pay | Admitting: Pharmacist

## 2021-11-10 ENCOUNTER — Other Ambulatory Visit: Payer: Self-pay

## 2021-11-10 ENCOUNTER — Ambulatory Visit: Payer: Self-pay | Attending: Internal Medicine | Admitting: Pharmacist

## 2021-11-10 VITALS — BP 144/72

## 2021-11-10 DIAGNOSIS — I10 Essential (primary) hypertension: Secondary | ICD-10-CM

## 2021-11-17 ENCOUNTER — Other Ambulatory Visit: Payer: Self-pay

## 2021-11-18 ENCOUNTER — Other Ambulatory Visit: Payer: Self-pay

## 2021-11-23 ENCOUNTER — Encounter: Payer: Self-pay | Admitting: Family

## 2021-11-24 ENCOUNTER — Encounter: Payer: Self-pay | Admitting: Family

## 2021-12-03 ENCOUNTER — Other Ambulatory Visit (HOSPITAL_COMMUNITY): Payer: Self-pay

## 2021-12-14 ENCOUNTER — Other Ambulatory Visit: Payer: Self-pay

## 2021-12-14 ENCOUNTER — Ambulatory Visit: Payer: Self-pay

## 2021-12-14 ENCOUNTER — Ambulatory Visit: Payer: Self-pay | Attending: Internal Medicine | Admitting: Pharmacist

## 2021-12-14 VITALS — BP 134/81

## 2021-12-14 DIAGNOSIS — I10 Essential (primary) hypertension: Secondary | ICD-10-CM

## 2021-12-14 NOTE — Progress Notes (Signed)
° °  S:    PCP: Dr. Wynetta Emery   Patient arrives in good spirits. Presents to the clinic for hypertension evaluation, counseling, and management. Patient was referred and last seen by Primary Care Provider, Dr. Wynetta Emery, on 10/07/2021 where amlodipine dose was increased due to persistently elevated blood pressure. Seen by CPP 11/10/21 and BP showed improvement.   Today, pt reports compliance with his antihypertensives. Denies any side effects.  Current BP Medications include:   -Amlodipine 10 mg QD, hydrochlorothiazide 12.5 mg daily  Antihypertensives tried in the past include:  -Losartan 100 mg: stopped by patient according to chart review, but patient does not recall a reason for discontinuation  Dietary habits include: tries to comply with sodium restriction. Denies excessive intake of caffeine.  Exercise habits include: none  Family / Social history:  -Mother: HTN, CHF -Current smoker, attempting to quit. -Reports marijuana use -Denies other illicit substance use  ASCVD risk factors include: HTN, stroke, smoking  O:  Vitals:   12/14/21 1429  BP: 134/81     Home BP readings: none. Patient reported willingness to begin measuring blood pressure at home.  Last 3 Office BP readings: BP Readings from Last 3 Encounters:  12/14/21 134/81  11/10/21 (!) 144/72  10/26/21 (!) 166/84    BMET    Component Value Date/Time   NA 141 10/28/2021 1352   K 4.5 10/28/2021 1352   CL 101 10/28/2021 1352   CO2 26 10/28/2021 1352   GLUCOSE 122 (H) 10/28/2021 1352   GLUCOSE 93 05/12/2021 1647   BUN 8 10/28/2021 1352   CREATININE 0.89 10/28/2021 1352   CREATININE 0.93 05/12/2021 1647   CALCIUM 9.9 10/28/2021 1352   GFRNONAA >60 05/04/2021 1751   GFRAA 54 (L) 03/28/2020 1855    Renal function: CrCl cannot be calculated (Patient's most recent lab result is older than the maximum 21 days allowed.).  Clinical ASCVD: Yes  The ASCVD Risk score (Arnett DK, et al., 2019) failed to calculate  for the following reasons:   The patient has a prior MI or stroke diagnosis   A/P: Hypertension longstanding, currently close to goal on current medications. BP Goal = < 130/80 mmHg. Medication adherence reported.  -Continue amlodipine 10 mg daily -Continue hydrochlorothiazide 12.5 mg daily  -F/u labs ordered - BMP  -Counseled on lifestyle modifications for blood pressure control including reduced dietary sodium, increased exercise, adequate sleep.  Total time in face-to-face counseling 30 minutes. F/U Clinic Visit with PCP.   [name], PharmD, [facility], [place] [phone]

## 2021-12-15 ENCOUNTER — Encounter: Payer: Self-pay | Admitting: Internal Medicine

## 2021-12-15 ENCOUNTER — Other Ambulatory Visit: Payer: Self-pay

## 2021-12-15 ENCOUNTER — Telehealth: Payer: Self-pay

## 2021-12-15 DIAGNOSIS — R7303 Prediabetes: Secondary | ICD-10-CM | POA: Insufficient documentation

## 2021-12-15 DIAGNOSIS — B20 Human immunodeficiency virus [HIV] disease: Secondary | ICD-10-CM

## 2021-12-15 LAB — BASIC METABOLIC PANEL
BUN/Creatinine Ratio: 13 (ref 9–20)
BUN: 11 mg/dL (ref 6–24)
CO2: 25 mmol/L (ref 20–29)
Calcium: 9.4 mg/dL (ref 8.7–10.2)
Chloride: 104 mmol/L (ref 96–106)
Creatinine, Ser: 0.83 mg/dL (ref 0.76–1.27)
Glucose: 112 mg/dL — ABNORMAL HIGH (ref 70–99)
Potassium: 4.4 mmol/L (ref 3.5–5.2)
Sodium: 142 mmol/L (ref 134–144)
eGFR: 103 mL/min/{1.73_m2} (ref >59)

## 2021-12-15 NOTE — Telephone Encounter (Signed)
Contacted pt to go over lab results pt didn't answer lvm   Sent a CRM and forward labs to NT to give pt labs when they call back   

## 2021-12-16 LAB — T-HELPER CELL (CD4) - (RCID CLINIC ONLY)
CD4 % Helper T Cell: 27 % — ABNORMAL LOW (ref 33–65)
CD4 T Cell Abs: 592 /uL (ref 400–1790)

## 2021-12-17 LAB — HIV-1 RNA QUANT-NO REFLEX-BLD

## 2021-12-17 LAB — CBC WITH DIFFERENTIAL/PLATELET

## 2021-12-17 LAB — COMPLETE METABOLIC PANEL WITH GFR

## 2021-12-20 LAB — CBC WITH DIFFERENTIAL/PLATELET
Absolute Monocytes: 442 cells/uL (ref 200–950)
Basophils Absolute: 51 cells/uL (ref 0–200)
Basophils Relative: 1.1 %
Eosinophils Absolute: 129 cells/uL (ref 15–500)
Eosinophils Relative: 2.8 %
HCT: 43.4 % (ref 38.5–50.0)
Hemoglobin: 14.9 g/dL (ref 13.2–17.1)
Lymphs Abs: 2240 cells/uL (ref 850–3900)
MCH: 30.7 pg (ref 27.0–33.0)
MCHC: 34.3 g/dL (ref 32.0–36.0)
MCV: 89.3 fL (ref 80.0–100.0)
MPV: 11 fL (ref 7.5–12.5)
Monocytes Relative: 9.6 %
Neutro Abs: 1739 cells/uL (ref 1500–7800)
Neutrophils Relative %: 37.8 %
Platelets: 251 10*3/uL (ref 140–400)
RBC: 4.86 10*6/uL (ref 4.20–5.80)
RDW: 13 % (ref 11.0–15.0)
Total Lymphocyte: 48.7 %
WBC: 4.6 10*3/uL (ref 3.8–10.8)

## 2021-12-20 LAB — COMPLETE METABOLIC PANEL WITH GFR
AG Ratio: 1.4 (calc) (ref 1.0–2.5)
ALT: 40 U/L (ref 9–46)
AST: 29 U/L (ref 10–35)
Albumin: 4.6 g/dL (ref 3.6–5.1)
Alkaline phosphatase (APISO): 71 U/L (ref 35–144)
BUN: 8 mg/dL (ref 7–25)
CO2: 31 mmol/L (ref 20–32)
Calcium: 9.8 mg/dL (ref 8.6–10.3)
Chloride: 102 mmol/L (ref 98–110)
Creat: 0.82 mg/dL (ref 0.70–1.30)
Globulin: 3.3 g/dL (calc) (ref 1.9–3.7)
Glucose, Bld: 112 mg/dL — ABNORMAL HIGH (ref 65–99)
Potassium: 3.9 mmol/L (ref 3.5–5.3)
Sodium: 138 mmol/L (ref 135–146)
Total Bilirubin: 1.3 mg/dL — ABNORMAL HIGH (ref 0.2–1.2)
Total Protein: 7.9 g/dL (ref 6.1–8.1)
eGFR: 103 mL/min/{1.73_m2} (ref >=60)

## 2021-12-20 LAB — HIV-1 RNA QUANT-NO REFLEX-BLD
HIV 1 RNA Quant: NOT DETECTED Copies/mL
HIV-1 RNA Quant, Log: NOT DETECTED Log cps/mL

## 2021-12-29 ENCOUNTER — Encounter: Payer: Self-pay | Admitting: Family

## 2021-12-31 ENCOUNTER — Encounter: Payer: Self-pay | Admitting: Family

## 2021-12-31 ENCOUNTER — Other Ambulatory Visit: Payer: Self-pay

## 2021-12-31 ENCOUNTER — Ambulatory Visit (INDEPENDENT_AMBULATORY_CARE_PROVIDER_SITE_OTHER): Payer: Managed Care, Other (non HMO) | Admitting: Family

## 2021-12-31 VITALS — BP 150/84 | HR 76 | Temp 98.0°F | Wt 180.0 lb

## 2021-12-31 DIAGNOSIS — Z720 Tobacco use: Secondary | ICD-10-CM | POA: Diagnosis not present

## 2021-12-31 DIAGNOSIS — B20 Human immunodeficiency virus [HIV] disease: Secondary | ICD-10-CM | POA: Diagnosis not present

## 2021-12-31 DIAGNOSIS — I10 Essential (primary) hypertension: Secondary | ICD-10-CM

## 2021-12-31 DIAGNOSIS — Z0000 Encounter for general adult medical examination without abnormal findings: Secondary | ICD-10-CM

## 2021-12-31 DIAGNOSIS — Z21 Asymptomatic human immunodeficiency virus [HIV] infection status: Secondary | ICD-10-CM

## 2021-12-31 MED ORDER — BICTEGRAVIR-EMTRICITAB-TENOFOV 50-200-25 MG PO TABS: 1.0000 | ORAL_TABLET | Freq: Every day | ORAL | 5 refills | Status: DC

## 2021-12-31 NOTE — Assessment & Plan Note (Signed)
Currently on amlodipine and hydrochlorothiazide and has concern about being dehydrated. Potassium levels are within normal ranges. Message sent to Internal Medicine team regarding Marcus Nichols concerns. Continue current dose of amlodipine and hydrochlorothiazide.

## 2021-12-31 NOTE — Progress Notes (Signed)
Brief Narrative   Patient ID: Marcus Nichols, male    DOB: August 17, 1966, 56 y.o.   MRN: [id number]  [name] is a 56 y/o AA male diagnosed with HIV disease in 1999 with risk factor of drug use and heterosexual contact. No history of opportunistic infection. Genotype on 05/12/21 with Subtype B and no significant resistance patterns. Initial CD4 and viral load are unavailable.Previous ART experience with Sustiva, combivir, Genvoya and now [employer].    Subjective:    Chief Complaint  Patient presents with   Follow-up    HPI:  Marcus FaceRandy Dreyfuss is a 56 y.o. male with HIV disease last seen on 08/03/21 with well controlled virus and good adherence and tolerance to his ART regimen of Biktarvy. Viral load was 43 with CD4 count of 476. Most recent lab work completed on 12/15/21 with viral load that remains undetectable and CD4 count of 592. Kidney function, liver function and electrolytes within normal ranges. Here today for routine follow up.  [name] has been taking his Biktarvy daily as prescribed with no adverse side effects. Concerned about his potassium levels and that his hydrochlorothiazide is dehydrating him. Also experienced about a 20 lb weight loss over the last month and has improved his nutrition. Denies fevers, chills, night sweats, headaches, changes in vision, neck pain/stiffness, nausea, diarrhea, vomiting, lesions or rashes.  [name] has no problems obtaining medication from the pharmacy and remains covered by [employer]. Denies feelings of being down, depressed or hopeless recently. No current recreational or illicit drug use or alcohol consumption. Smokes about 1 pack of cigarettes per day. Condoms offered. Healthcare maintenance due includes Covid booster,    Allergies  Allergen Reactions   Grass Pollen(K-O-R-T-Swt Vern) Rash      Outpatient Medications Prior to Visit  Medication Sig Dispense Refill   amLODipine (NORVASC) 10 MG tablet Take 1 tablet (10 mg total) by mouth daily.  30 tablet 6   atorvastatin (LIPITOR) 80 MG tablet Take 1 tablet (80 mg total) by mouth daily. 30 tablet 6   hydrochlorothiazide (HYDRODIURIL) 12.5 MG tablet Take 1 tablet (12.5 mg total) by mouth daily. 90 tablet 3   bictegravir-emtricitabine-tenofovir AF (BIKTARVY) 50-200-25 MG TABS tablet Take 1 tablet by mouth daily. 30 tablet 3   aspirin 81 MG EC tablet Take 1 tablet (81 mg total) by mouth daily. Swallow whole. (Patient not taking: Reported on 12/31/2021) 90 tablet 0   nicotine (NICODERM CQ - DOSED IN MG/24 HOURS) 21 mg/24hr patch Place 1 patch (21 mg total) onto the skin daily. (Patient not taking: Reported on 12/31/2021) 28 patch 1   No facility-administered medications prior to visit.     Past Medical History:  Diagnosis Date   Asthma    Eczema    Enlarged prostate    HIV (human immunodeficiency virus infection) (HCC)    Humerus fracture    left    Inguinal hernia    Schizophrenia (HCC)    Sleep apnea    does not wear CPAP ( per spouse )     Past Surgical History:  Procedure Laterality Date   MULTIPLE TOOTH EXTRACTIONS     ORIF HUMERUS FRACTURE Left 03/07/2019   Procedure: OPEN REDUCTION INTERNAL FIXATION (ORIF) HUMERAL SHAFT FRACTURE;  Surgeon: Roby LoftsHaddix, Kevin P, MD;  Location: [facility] OR;  Service: Orthopedics;  Laterality: Left;      Review of Systems  Constitutional:  Positive for unexpected weight change. Negative for appetite change, chills, fatigue and fever.  Eyes:  Negative  for visual disturbance.  Respiratory:  Negative for cough, chest tightness, shortness of breath and wheezing.   Cardiovascular:  Negative for chest pain and leg swelling.  Gastrointestinal:  Negative for abdominal pain, constipation, diarrhea, nausea and vomiting.  Genitourinary:  Negative for dysuria, flank pain, frequency, genital sores, hematuria and urgency.  Skin:  Negative for rash.  Allergic/Immunologic: Negative for immunocompromised state.  Neurological:  Negative for dizziness and  headaches.     Objective:    BP (!) 150/84    Pulse 76    Temp 98 F (36.7 C) (Oral)    Wt 180 lb (81.6 kg)    BMI 23.11 kg/m  Nursing note and vital signs reviewed.  Physical Exam Constitutional:      General: He is not in acute distress.    Appearance: He is well-developed.  Eyes:     Conjunctiva/sclera: Conjunctivae normal.  Cardiovascular:     Rate and Rhythm: Normal rate and regular rhythm.     Heart sounds: Normal heart sounds. No murmur heard.   No friction rub. No gallop.  Pulmonary:     Effort: Pulmonary effort is normal. No respiratory distress.     Breath sounds: Normal breath sounds. No wheezing or rales.  Chest:     Chest wall: No tenderness.  Abdominal:     General: Bowel sounds are normal.     Palpations: Abdomen is soft.     Tenderness: There is no abdominal tenderness.  Musculoskeletal:     Cervical back: Neck supple.  Lymphadenopathy:     Cervical: No cervical adenopathy.  Skin:    General: Skin is warm and dry.     Findings: No rash.  Neurological:     Mental Status: He is alert and oriented to person, place, and time.  Psychiatric:        Behavior: Behavior normal.        Thought Content: Thought content normal.        Judgment: Judgment normal.     Depression screen [facility] 2/9 12/31/2021 08/03/2021 06/16/2021 05/12/2021  Decreased Interest 0 0 0 0  Down, Depressed, Hopeless 0 0 0 0  PHQ - 2 Score 0 0 0 0       Assessment & Plan:    Patient Active Problem List   Diagnosis Date Noted   Prediabetes 12/15/2021   History of CVA with residual deficit 06/21/2021   Hyperglycemia 06/21/2021   Eczema 06/16/2021   Healthcare maintenance 05/12/2021   Hypertensive urgency 05/05/2021   Acute left-sided weakness 05/05/2021   Chest pain 05/05/2021   Alcohol abuse 05/05/2021   HIV (human immunodeficiency virus infection) ([place]) 05/05/2021   Tobacco use 05/05/2021   Polysubstance abuse ([place]) 05/05/2021   Paresthesia 05/05/2021   Schizophrenia ([place])  05/05/2021   Right-sided headache 05/05/2021   Cerebral thrombosis with cerebral infarction 05/05/2021   Essential hypertension 03/20/2019   Opiate overdose ([place]) 03/19/2019   Leukocytosis 03/19/2019   Hypokalemia 03/19/2019   Left arm pain 03/19/2019   Elevated blood pressure reading 03/19/2019   Comminuted fracture of humerus, left, open, initial encounter 02/28/2019     Problem List Items Addressed This Visit       Cardiovascular and Mediastinum   Essential hypertension    Currently on amlodipine and hydrochlorothiazide and has concern about being dehydrated. Potassium levels are within normal ranges. Message sent to Internal Medicine team regarding Mr. Yeager concerns. Continue current dose of amlodipine and hydrochlorothiazide.         Other  HIV (human immunodeficiency virus infection) [facility])    Mr. Crissey continues to have well-controlled virus with good adherence and tolerance of his ART regimen of Biktarvy.  No signs/symptoms of opportunistic infection.  We reviewed lab work and discussed plan of care.  Continue current dose of Biktarvy.  Plan for follow-up in 3 months or sooner if needed with lab work 1 to 2 weeks prior to appointment.      Relevant Medications   bictegravir-emtricitabine-tenofovir AF (BIKTARVY) 50-200-25 MG TABS tablet   Tobacco use    Mr. Gholar continues to use tobacco. Discussed risks including cardiovascular, respiratory and malignancy. He is in the pre-contemplation stage of change at this point.      Healthcare maintenance    Discussed importance of safe sexual practices and condom use.  Condoms offered. Vaccinations up-to-date per recommendations. Recommended routine dental care.  Can refer to [facility] clinic if needed       Other Visit Diagnoses     HIV disease ([place])    -  Primary   Relevant Medications   bictegravir-emtricitabine-tenofovir AF (BIKTARVY) 50-200-25 MG TABS tablet   Other Relevant Orders   HIV-1 RNA quant-no reflex-bld    T-helper cells (CD4) count (not at [facility])        I am having Retta Diones maintain his aspirin, amLODipine, atorvastatin, nicotine, hydrochlorothiazide, and bictegravir-emtricitabine-tenofovir AF.   Meds ordered this encounter  Medications   bictegravir-emtricitabine-tenofovir AF (BIKTARVY) 50-200-25 MG TABS tablet    Sig: Take 1 tablet by mouth daily.    Dispense:  30 tablet    Refill:  5    BIN: [id number] PCN: [id number] Group: [id number] Member: [id number]    Order Specific Question:   Supervising Provider    Answer:   Carlyle Basques [[id number]]     Follow-up: Return in about 3 months (around 03/30/2022), or if symptoms worsen or fail to improve.   Terri Piedra, MSN, FNP-C Nurse Practitioner [facility] for Infectious Disease [place] number: [phone]

## 2021-12-31 NOTE — Assessment & Plan Note (Signed)
Marcus Nichols continues to have well-controlled virus with good adherence and tolerance of his ART regimen of Biktarvy.  No signs/symptoms of opportunistic infection.  We reviewed lab work and discussed plan of care.  Continue current dose of Biktarvy.  Plan for follow-up in 3 months or sooner if needed with lab work 1 to 2 weeks prior to appointment.

## 2021-12-31 NOTE — Assessment & Plan Note (Signed)
·   Discussed importance of safe sexual practices and condom use.  Condoms offered.  Vaccinations up-to-date per recommendations.  Recommended routine dental care.  Can refer to [facility] clinic if needed

## 2021-12-31 NOTE — Patient Instructions (Signed)
Nice to see you.  Continue to take your medication daily as prescribed.  Refills have been sent to the pharmacy.  Plan for follow up in 3 months or sooner if needed with lab work 1-2 weeks prior to appointment.   Have a great day and stay safe!  

## 2021-12-31 NOTE — Assessment & Plan Note (Signed)
Marcus Nichols continues to use tobacco. Discussed risks including cardiovascular, respiratory and malignancy. He is in the pre-contemplation stage of change at this point.

## 2022-01-14 ENCOUNTER — Ambulatory Visit: Payer: Managed Care, Other (non HMO) | Attending: Family Medicine | Admitting: Pharmacist

## 2022-01-14 ENCOUNTER — Other Ambulatory Visit: Payer: Self-pay

## 2022-02-04 ENCOUNTER — Other Ambulatory Visit: Payer: Self-pay

## 2022-02-04 ENCOUNTER — Ambulatory Visit: Payer: Managed Care, Other (non HMO) | Attending: Internal Medicine | Admitting: Internal Medicine

## 2022-02-04 ENCOUNTER — Encounter: Payer: Self-pay | Admitting: Internal Medicine

## 2022-02-04 VITALS — BP 137/75 | HR 80 | Resp 18 | Ht 74.0 in | Wt 190.0 lb

## 2022-02-04 DIAGNOSIS — B20 Human immunodeficiency virus [HIV] disease: Secondary | ICD-10-CM

## 2022-02-04 DIAGNOSIS — F17200 Nicotine dependence, unspecified, uncomplicated: Secondary | ICD-10-CM

## 2022-02-04 DIAGNOSIS — I10 Essential (primary) hypertension: Secondary | ICD-10-CM

## 2022-02-04 MED ORDER — LOSARTAN POTASSIUM 25 MG PO TABS
25.0000 mg | ORAL_TABLET | Freq: Every day | ORAL | 4 refills | Status: DC
  Filled 2022-02-04: qty 30, 30d supply, fill #0
  Filled 2022-03-16: qty 30, 30d supply, fill #1
  Filled 2022-04-15: qty 30, 30d supply, fill #2
  Filled 2022-05-23: qty 30, 30d supply, fill #3
  Filled 2022-06-24: qty 30, 30d supply, fill #4

## 2022-02-04 NOTE — Progress Notes (Signed)
? ? ?Patient ID: Marcus Nichols, male    DOB: 1966-10-19  MRN: [id number] ? ?CC: Hypertension ? ? ?Subjective: ?Marcus Nichols is a 56 y.o. male who presents for chronic ds management ?His concerns today include:  ?Pt with history of HTN, tob dep, CVA 04/2021 with LT sided weakness, polysubstance abuse (including EtOH, marijuana and cocaine),  HIV, schizophrenia. ? ?HYPERTENSION ?Currently taking: see medication list ?Med Adherence: [x]  Yes -Norvasc 10 mg and HCTZ 12.5 mg    ?Medication side effects: []  Yes    [x]  No ?Adherence with salt restriction: [x]  Yes    []  No ?Home Monitoring?: [x]  Yes  -does not have log with him ?Monitoring Frequency:  ?Home BP results range:  ?SOB? []  Yes    [x]  No ?Chest Pain?: []  Yes    [x]  No ?Leg swelling?: []  Yes    [x]  No ?Headaches?: []  Yes    [x]  No ?Dizziness? []  Yes    [x]  No ?Comments:  ? ?HL/CVA:  taking and tolerating Lipitor without cramps ? ?Tob dep:  nicotine patches prescribed on last visit.  Pt states he was still smoking with patches on.  Currently at 1 pk a day.  Use to smoke 2-3 pks a day.  ? ?HIV:  taking Biktarvy as prescribed by ID.  Saw ID recently.  Levels undetectable ? ?HM: due for Shingrix ?Patient Active Problem List  ? Diagnosis Date Noted  ? Prediabetes 12/15/2021  ? History of CVA with residual deficit 06/21/2021  ? Hyperglycemia 06/21/2021  ? Eczema 06/16/2021  ? Healthcare maintenance 05/12/2021  ? Hypertensive urgency 05/05/2021  ? Acute left-sided weakness 05/05/2021  ? Chest pain 05/05/2021  ? Alcohol abuse 05/05/2021  ? HIV (human immunodeficiency virus infection) (HCC) 05/05/2021  ? Tobacco use 05/05/2021  ? Polysubstance abuse (HCC) 05/05/2021  ? Paresthesia 05/05/2021  ? Schizophrenia (HCC) 05/05/2021  ? Right-sided headache 05/05/2021  ? Cerebral thrombosis with cerebral infarction 05/05/2021  ? Essential hypertension 03/20/2019  ? Opiate overdose (HCC) 03/19/2019  ? Leukocytosis 03/19/2019  ? Hypokalemia 03/19/2019  ? Left arm pain 03/19/2019  ?  Elevated blood pressure reading 03/19/2019  ? Comminuted fracture of humerus, left, open, initial encounter 02/28/2019  ?  ? ?Current Outpatient Medications on File Prior to Visit  ?Medication Sig Dispense Refill  ? amLODipine (NORVASC) 10 MG tablet Take 1 tablet (10 mg total) by mouth daily. 30 tablet 6  ? aspirin 81 MG EC tablet Take 1 tablet (81 mg total) by mouth daily. Swallow whole. 90 tablet 0  ? atorvastatin (LIPITOR) 80 MG tablet Take 1 tablet (80 mg total) by mouth daily. 30 tablet 6  ? hydrochlorothiazide (HYDRODIURIL) 12.5 MG tablet Take 1 tablet (12.5 mg total) by mouth daily. 90 tablet 3  ? bictegravir-emtricitabine-tenofovir AF (BIKTARVY) 50-200-25 MG TABS tablet Take 1 tablet by mouth daily. (Patient not taking: Reported on 02/04/2022) 30 tablet 5  ? nicotine (NICODERM CQ - DOSED IN MG/24 HOURS) 21 mg/24hr patch Place 1 patch (21 mg total) onto the skin daily. (Patient not taking: Reported on 12/31/2021) 28 patch 1  ? [DISCONTINUED] gabapentin (NEURONTIN) 300 MG capsule Take 2 capsules (600 mg total) by mouth 2 (two) times daily. (Patient not taking: Reported on 03/28/2020) 60 capsule 1  ? [DISCONTINUED] losartan (COZAAR) 100 MG tablet Take 1 tablet (100 mg total) by mouth daily. (Patient not taking: Reported on 03/28/2020) 90 tablet 3  ? ?No current facility-administered medications on file prior to visit.  ? ? ?Allergies  ?Allergen Reactions  ?  Grass Pollen(K-O-R-T-Swt Vern) Rash  ? ? ?Social History  ? ?Socioeconomic History  ? Marital status: Single  ?  Spouse name: Not on file  ? Number of children: Not on file  ? Years of education: Not on file  ? Highest education level: Not on file  ?Occupational History  ? Not on file  ?Tobacco Use  ? Smoking status: Every Day  ?  Packs/day: 1.00  ?  Types: Cigarettes  ? Smokeless tobacco: Never  ?Vaping Use  ? Vaping Use: Never used  ?Substance and Sexual Activity  ? Alcohol use: Not Currently  ?  Comment: pt. stated that he had some "hennessey"  ? Drug use: Not  Currently  ?  Types: Cocaine, Marijuana  ?  Comment: + Cocaine and +THC  ? Sexual activity: Not Currently  ?  Comment: declined condoms  ?Other Topics Concern  ? Not on file  ?Social History Narrative  ? ** Merged History Encounter **  ?    ? ?Social Determinants of Health  ? ?Financial Resource Strain: Not on file  ?Food Insecurity: Not on file  ?Transportation Needs: Not on file  ?Physical Activity: Not on file  ?Stress: Not on file  ?Social Connections: Not on file  ?Intimate Partner Violence: Not on file  ? ? ?Family History  ?Problem Relation Age of Onset  ? Hypertension Mother   ? Congestive Heart Failure Mother   ? ? ?Past Surgical History:  ?Procedure Laterality Date  ? MULTIPLE TOOTH EXTRACTIONS    ? ORIF HUMERUS FRACTURE Left 03/07/2019  ? Procedure: OPEN REDUCTION INTERNAL FIXATION (ORIF) HUMERAL SHAFT FRACTURE;  Surgeon: Roby Lofts, MD;  Location: [facility] OR;  Service: Orthopedics;  Laterality: Left;  ? ? ?ROS: ?Review of Systems ?Negative except as stated above ? ?PHYSICAL EXAM: ?BP 137/75   Pulse 80   Resp 18   Ht 6\' 2"  (1.88 m)   Wt 190 lb (86.2 kg)   SpO2 98%   BMI 24.39 kg/m?   ?Physical Exam ?BP 138/82 ? ?General appearance - alert, well appearing, middle-age AAM and in no distress ?Mental status - normal mood, behavior, speech, dress, motor activity, and thought processes ?Neck - supple, no significant adenopathy ?Chest - clear to auscultation, no wheezes, rales or rhonchi, symmetric air entry ?Heart - normal rate, regular rhythm, normal S1, S2, no murmurs, rubs, clicks or gallops ?Extremities - peripheral pulses normal, no pedal edema, no clubbing or cyanosis ? ?CMP Latest Ref Rng & Units 12/15/2021 12/15/2021 12/14/2021  ?Glucose 65 - 99 mg/dL 12/16/2021) CANCELED 607(P)  ?BUN 7 - 25 mg/dL 8 - 11  ?Creatinine 0.70 - 1.30 mg/dL 710(G - 2.69  ?Sodium 135 - 146 mmol/L 138 - 142  ?Potassium 3.5 - 5.3 mmol/L 3.9 - 4.4  ?Chloride 98 - 110 mmol/L 102 - 104  ?CO2 20 - 32 mmol/L 31 - 25  ?Calcium 8.6 - 10.3  mg/dL 9.8 - 9.4  ?Total Protein 6.1 - 8.1 g/dL 7.9 - -  ?Total Bilirubin 0.2 - 1.2 mg/dL 4.85) - -  ?Alkaline Phos 44 - 121 IU/L - - -  ?AST 10 - 35 U/L 29 - -  ?ALT 9 - 46 U/L 40 - -  ? ?Lipid Panel  ?   ?Component Value Date/Time  ? CHOL 108 05/05/2021 0330  ? TRIG 100 05/05/2021 0330  ? HDL 41 05/05/2021 0330  ? CHOLHDL 2.6 05/05/2021 0330  ? VLDL 20 05/05/2021 0330  ? LDLCALC 47 05/05/2021 0330  ? ? ?  CBC ?   ?Component Value Date/Time  ? WBC 4.6 12/15/2021 1348  ? WBC CANCELED 12/15/2021 1348  ? RBC 4.86 12/15/2021 1348  ? HGB 14.9 12/15/2021 1348  ? HGB 15.7 10/28/2021 1352  ? HCT 43.4 12/15/2021 1348  ? HCT 45.7 10/28/2021 1352  ? PLT 251 12/15/2021 1348  ? PLT 279 10/28/2021 1352  ? MCV 89.3 12/15/2021 1348  ? MCV 88 10/28/2021 1352  ? MCH 30.7 12/15/2021 1348  ? MCHC 34.3 12/15/2021 1348  ? RDW 13.0 12/15/2021 1348  ? RDW 12.5 10/28/2021 1352  ? LYMPHSABS 2,240 12/15/2021 1348  ? MONOABS 0.4 05/04/2021 1751  ? EOSABS 129 12/15/2021 1348  ? BASOSABS 51 12/15/2021 1348  ? ? ?ASSESSMENT AND PLAN: ?1. Essential hypertension ?Not at goal.  Continue Norvasc and HCTZ.  Add low dose Cozaar.  After being on Cozaar x 1 wk, advised to return to lab for BMP ?- losartan (COZAAR) 25 MG tablet; Take 1 tablet (25 mg total) by mouth daily.  Dispense: 30 tablet; Refill: 4 ?- Basic Metabolic Panel; Future ? ?2. Tobacco dependence ?Advised to quit.  He is aware of health risks associated with smoking.  He appears not mentally ready to quit completely. ? ?3. History of HIV infection (HCC) ?Followed by ID.  Encouraged him to remain compliant with taking his medication. ? ? ? ?Patient was given the opportunity to ask questions.  Patient verbalized understanding of the plan and was able to repeat key elements of the plan.  ? ?This documentation was completed using Paediatric nurseDragon voice recognition technology.  Any transcriptional errors are unintentional. ? ?No orders of the defined types were placed in this encounter. ? ? ? ?Requested  Prescriptions  ? ? No prescriptions requested or ordered in this encounter  ? ? ?No follow-ups on file. ? ?Jonah Blueeborah Alioune Hodgkin, MD, FACP ?

## 2022-02-04 NOTE — Patient Instructions (Signed)
Your blood pressure is not at goal.  The goal is 130/80 or lower. ?Continue amlodipine 10 mg daily. ?Continue hydrochlorothiazide 12.5 mg daily. ?We have added another blood pressure medication called losartan 25 mg daily.  After you have been on this medication for 1 week, please return to our laboratory to have blood test done to check your potassium and kidney function. ?

## 2022-02-14 ENCOUNTER — Other Ambulatory Visit: Payer: Managed Care, Other (non HMO)

## 2022-03-03 ENCOUNTER — Other Ambulatory Visit: Payer: Self-pay

## 2022-03-03 ENCOUNTER — Other Ambulatory Visit (HOSPITAL_COMMUNITY): Payer: Self-pay

## 2022-03-16 ENCOUNTER — Other Ambulatory Visit (HOSPITAL_COMMUNITY): Payer: Self-pay

## 2022-03-16 ENCOUNTER — Other Ambulatory Visit: Payer: Self-pay

## 2022-03-17 ENCOUNTER — Other Ambulatory Visit: Payer: Self-pay

## 2022-04-15 ENCOUNTER — Other Ambulatory Visit: Payer: Self-pay

## 2022-04-18 ENCOUNTER — Other Ambulatory Visit: Payer: Self-pay

## 2022-05-23 ENCOUNTER — Other Ambulatory Visit: Payer: Self-pay

## 2022-05-24 ENCOUNTER — Other Ambulatory Visit: Payer: Self-pay

## 2022-06-01 ENCOUNTER — Other Ambulatory Visit: Payer: Self-pay

## 2022-06-06 ENCOUNTER — Ambulatory Visit: Payer: Managed Care, Other (non HMO) | Admitting: Internal Medicine

## 2022-06-24 ENCOUNTER — Other Ambulatory Visit: Payer: Self-pay

## 2022-06-24 ENCOUNTER — Other Ambulatory Visit: Payer: Self-pay | Admitting: Internal Medicine

## 2022-06-24 DIAGNOSIS — Z8673 Personal history of transient ischemic attack (TIA), and cerebral infarction without residual deficits: Secondary | ICD-10-CM

## 2022-06-27 ENCOUNTER — Other Ambulatory Visit: Payer: Self-pay

## 2022-08-10 ENCOUNTER — Other Ambulatory Visit: Payer: Self-pay | Admitting: Family

## 2022-08-19 ENCOUNTER — Other Ambulatory Visit: Payer: Self-pay | Admitting: Family

## 2022-08-19 DIAGNOSIS — B20 Human immunodeficiency virus [HIV] disease: Secondary | ICD-10-CM

## 2022-08-26 ENCOUNTER — Other Ambulatory Visit: Payer: Self-pay | Admitting: Family

## 2022-08-26 DIAGNOSIS — B20 Human immunodeficiency virus [HIV] disease: Secondary | ICD-10-CM

## 2022-09-20 ENCOUNTER — Other Ambulatory Visit: Payer: Self-pay | Admitting: Family

## 2022-09-20 ENCOUNTER — Telehealth: Payer: Self-pay

## 2022-09-20 DIAGNOSIS — B20 Human immunodeficiency virus [HIV] disease: Secondary | ICD-10-CM

## 2022-09-20 NOTE — Telephone Encounter (Signed)
  Attempted to contact patient as they are overdue for appointment and labs. Staff contacted pharmacy who provided alternate phone listed for patient at [phone].   Staff left HIPPA compliant voicemail on this alternate phone number.  Patient needs office to continue medication refills if returns call.  Marcus Nichols

## 2022-10-19 ENCOUNTER — Other Ambulatory Visit: Payer: Self-pay | Admitting: Family

## 2022-10-19 DIAGNOSIS — B20 Human immunodeficiency virus [HIV] disease: Secondary | ICD-10-CM

## 2022-11-16 ENCOUNTER — Other Ambulatory Visit: Payer: Self-pay | Admitting: Family

## 2022-11-16 DIAGNOSIS — B20 Human immunodeficiency virus [HIV] disease: Secondary | ICD-10-CM

## 2022-11-17 ENCOUNTER — Other Ambulatory Visit: Payer: Self-pay

## 2022-11-17 ENCOUNTER — Other Ambulatory Visit (HOSPITAL_COMMUNITY)
Admission: RE | Admit: 2022-11-17 | Discharge: 2022-11-17 | Disposition: A | Discharge status: Home / Self Care | Payer: Commercial Managed Care - HMO | Source: Ambulatory Visit | Attending: Family | Admitting: Family

## 2022-11-17 ENCOUNTER — Other Ambulatory Visit: Payer: Commercial Managed Care - HMO

## 2022-11-17 ENCOUNTER — Telehealth: Payer: Self-pay

## 2022-11-17 DIAGNOSIS — B20 Human immunodeficiency virus [HIV] disease: Secondary | ICD-10-CM | POA: Diagnosis present

## 2022-11-17 DIAGNOSIS — Z113 Encounter for screening for infections with a predominantly sexual mode of transmission: Secondary | ICD-10-CM | POA: Insufficient documentation

## 2022-11-17 DIAGNOSIS — Z79899 Other long term (current) drug therapy: Secondary | ICD-10-CM

## 2022-11-17 MED ORDER — PREDNISONE 10 MG (21) PO TBPK: ORAL_TABLET | ORAL | 0 refills | Status: DC

## 2022-11-17 NOTE — Telephone Encounter (Signed)
Patient here in clinic for labs, requesting refill of prednisone. States he's broken out in more patches of eczema.   Is also asking for a tetanus shot as he was bit by a dog two weeks ago. Last Tdap was in 2019. Also asking for antibiotics.   Assessed area where bite occurred on right calf, no redness, drainage, or swelling and wound appears healed over. Asked Efraim to please call if site of bite worsens with redness, drainage, swelling.    Will route to provider.   Sandie Ano, RN

## 2022-11-17 NOTE — Addendum Note (Signed)
Addended by: Jeanine Luz D on: 11/17/2022 03:47 PM   Modules accepted: Orders

## 2022-11-17 NOTE — Telephone Encounter (Signed)
Prednisone taper sent to pharmacy. Tdap should be fine if last is 2019 and agree with follow up if wound already healed. Sent to [facility] in [place].

## 2022-11-17 NOTE — Telephone Encounter (Signed)
Patient came in for labs and is requesting refill for Prednisone 10MG  , best contact number is [phone] to [facility]

## 2022-11-17 NOTE — Telephone Encounter (Signed)
Spoke with patient, see new phone note.   Jerron Niblack D Rossi Silvestro, RN  

## 2022-11-18 ENCOUNTER — Other Ambulatory Visit: Payer: Commercial Managed Care - HMO

## 2022-11-18 LAB — URINE CYTOLOGY ANCILLARY ONLY
Chlamydia: NEGATIVE
Comment: NEGATIVE
Comment: NORMAL
Neisseria Gonorrhea: NEGATIVE

## 2022-11-18 LAB — T-HELPER CELL (CD4) - (RCID CLINIC ONLY)
CD4 % Helper T Cell: 28 % — ABNORMAL LOW (ref 33–65)
CD4 T Cell Abs: 387 /uL — ABNORMAL LOW (ref 400–1790)

## 2022-11-20 LAB — CBC WITH DIFFERENTIAL/PLATELET
Absolute Monocytes: 484 cells/uL (ref 200–950)
Basophils Absolute: 40 cells/uL (ref 0–200)
Basophils Relative: 0.9 %
Eosinophils Absolute: 211 cells/uL (ref 15–500)
Eosinophils Relative: 4.8 %
HCT: 43.8 % (ref 38.5–50.0)
Hemoglobin: 15.2 g/dL (ref 13.2–17.1)
Lymphs Abs: 1390 cells/uL (ref 850–3900)
MCH: 31.3 pg (ref 27.0–33.0)
MCHC: 34.7 g/dL (ref 32.0–36.0)
MCV: 90.1 fL (ref 80.0–100.0)
MPV: 11 fL (ref 7.5–12.5)
Monocytes Relative: 11 %
Neutro Abs: 2275 cells/uL (ref 1500–7800)
Neutrophils Relative %: 51.7 %
Platelets: 249 10*3/uL (ref 140–400)
RBC: 4.86 10*6/uL (ref 4.20–5.80)
RDW: 13 % (ref 11.0–15.0)
Total Lymphocyte: 31.6 %
WBC: 4.4 10*3/uL (ref 3.8–10.8)

## 2022-11-20 LAB — COMPLETE METABOLIC PANEL WITH GFR
AG Ratio: 1.3 (calc) (ref 1.0–2.5)
ALT: 20 U/L (ref 9–46)
AST: 27 U/L (ref 10–35)
Albumin: 4.3 g/dL (ref 3.6–5.1)
Alkaline phosphatase (APISO): 58 U/L (ref 35–144)
BUN: 13 mg/dL (ref 7–25)
CO2: 29 mmol/L (ref 20–32)
Calcium: 9.7 mg/dL (ref 8.6–10.3)
Chloride: 102 mmol/L (ref 98–110)
Creat: 1 mg/dL (ref 0.70–1.30)
Globulin: 3.2 g/dL (calc) (ref 1.9–3.7)
Glucose, Bld: 62 mg/dL — ABNORMAL LOW (ref 65–99)
Potassium: 4.5 mmol/L (ref 3.5–5.3)
Sodium: 138 mmol/L (ref 135–146)
Total Bilirubin: 1 mg/dL (ref 0.2–1.2)
Total Protein: 7.5 g/dL (ref 6.1–8.1)
eGFR: 88 mL/min/{1.73_m2} (ref >=60)

## 2022-11-20 LAB — LIPID PANEL
Cholesterol: 178 mg/dL (ref <200)
HDL: 82 mg/dL (ref >=40)
LDL Cholesterol (Calc): 79 mg/dL (calc)
Non-HDL Cholesterol (Calc): 96 mg/dL (calc) (ref <130)
Total CHOL/HDL Ratio: 2.2 (calc) (ref <5.0)
Triglycerides: 91 mg/dL (ref <150)

## 2022-11-20 LAB — HIV-1 RNA QUANT-NO REFLEX-BLD
HIV 1 RNA Quant: NOT DETECTED Copies/mL
HIV-1 RNA Quant, Log: NOT DETECTED Log cps/mL

## 2022-11-20 LAB — RPR: RPR Ser Ql: NONREACTIVE

## 2022-12-01 ENCOUNTER — Ambulatory Visit (INDEPENDENT_AMBULATORY_CARE_PROVIDER_SITE_OTHER): Payer: Commercial Managed Care - HMO | Admitting: Family

## 2022-12-01 ENCOUNTER — Other Ambulatory Visit: Payer: Self-pay

## 2022-12-01 ENCOUNTER — Ambulatory Visit (INDEPENDENT_AMBULATORY_CARE_PROVIDER_SITE_OTHER): Payer: Commercial Managed Care - HMO

## 2022-12-01 ENCOUNTER — Encounter: Payer: Self-pay | Admitting: Family

## 2022-12-01 VITALS — BP 136/84 | HR 82 | Temp 98.1°F | Resp 16 | Ht 74.0 in | Wt 176.0 lb

## 2022-12-01 DIAGNOSIS — Z23 Encounter for immunization: Secondary | ICD-10-CM

## 2022-12-01 DIAGNOSIS — B20 Human immunodeficiency virus [HIV] disease: Secondary | ICD-10-CM

## 2022-12-01 DIAGNOSIS — I10 Essential (primary) hypertension: Secondary | ICD-10-CM

## 2022-12-01 DIAGNOSIS — Z21 Asymptomatic human immunodeficiency virus [HIV] infection status: Secondary | ICD-10-CM

## 2022-12-01 DIAGNOSIS — Z0000 Encounter for general adult medical examination without abnormal findings: Secondary | ICD-10-CM | POA: Diagnosis not present

## 2022-12-01 DIAGNOSIS — Z113 Encounter for screening for infections with a predominantly sexual mode of transmission: Secondary | ICD-10-CM

## 2022-12-01 DIAGNOSIS — Z79899 Other long term (current) drug therapy: Secondary | ICD-10-CM

## 2022-12-01 MED ORDER — BIKTARVY 50-200-25 MG PO TABS: 1.0000 | ORAL_TABLET | Freq: Every day | ORAL | 6 refills | Status: DC

## 2022-12-01 NOTE — Assessment & Plan Note (Signed)
Discussed importance of safe sexual practice and condom use. Condoms and STD testing offered.  Covid and influenza vaccination updated.

## 2022-12-01 NOTE — Patient Instructions (Addendum)
Nice to see you.  Continue to take your medication daily as prescribed.  Refills have been sent to the pharmacy.  Plan for follow up in 6 months or sooner if needed with lab work 1-2 weeks prior to appointment.   Have a great day and stay safe!  

## 2022-12-01 NOTE — Progress Notes (Signed)
Brief Narrative   Patient ID: Marcus Nichols, male    DOB: 02-Nov-1966, 57 y.o.   MRN: [id number]  Marcus Nichols is a 57 y/o AA male diagnosed with HIV disease in 1999 with risk factor of drug use and heterosexual contact. No history of opportunistic infection. Genotype on 05/12/21 with Subtype B and no significant resistance patterns. Initial CD4 and viral load are unavailable.Previous ART experience with [employer], combivir, [employer] and now [employer].     Subjective:    Chief Complaint  Patient presents with   Follow-up    B20     HPI:  Marcus Nichols is a 57 y.o. male with HIV disease last seen on 12/31/21 with well controlled virus and good adherence and tolerance to [employer]. Viral load was undetectable and CD4 count 592. Renal function, hepatic function and electrolytes within normal ranges. Most recent lab work completed on 11/17/22 with viral load that remains undetectable and CD4 count 387. STD testing negative for gonorrhea, chlamydia and syphilis.  Renal function, hepatic function and electrolytes were normal. Here today for follow up.  Marcus Nichols has been doing okay since his last office visit and feels tired and is having some back soreness from sleeping on a couch. Continues to take Biktarvy as with no adverse side effects or problems obtaining medication. No new concerns/complaints. Condoms and STD testing offered. Requesting influenza and Covid vaccinations. Has stopped taking his blood pressure medications because they make him feel bad.  Denies fevers, chills, night sweats, headaches, changes in vision, neck pain/stiffness, nausea, diarrhea, vomiting, lesions or rashes.   Allergies  Allergen Reactions   Grass Pollen(K-O-R-T-Swt Vern) Rash      Outpatient Medications Prior to Visit  Medication Sig Dispense Refill   BIKTARVY 50-200-25 MG TABS tablet TAKE 1 TABLET BY MOUTH DAILY 30 tablet 0   amLODipine (NORVASC) 10 MG tablet Take 1 tablet (10 mg total) by mouth daily. (Patient  not taking: Reported on 12/01/2022) 30 tablet 6   aspirin 81 MG EC tablet Take 1 tablet (81 mg total) by mouth daily. Swallow whole. (Patient not taking: Reported on 12/01/2022) 90 tablet 0   atorvastatin (LIPITOR) 80 MG tablet Take 1 tablet (80 mg total) by mouth daily. (Patient not taking: Reported on 12/01/2022) 30 tablet 6   hydrochlorothiazide (HYDRODIURIL) 12.5 MG tablet Take 1 tablet (12.5 mg total) by mouth daily. (Patient not taking: Reported on 12/01/2022) 90 tablet 3   losartan (COZAAR) 25 MG tablet Take 1 tablet (25 mg total) by mouth daily. (Patient not taking: Reported on 12/01/2022) 30 tablet 4   predniSONE (STERAPRED UNI-PAK 21 TAB) 10 MG (21) TBPK tablet Take per package instructions (Patient not taking: Reported on 12/01/2022) 21 each 0   No facility-administered medications prior to visit.     Past Medical History:  Diagnosis Date   Asthma    Eczema    Enlarged prostate    HIV (human immunodeficiency virus infection) (HCC)    Humerus fracture    left    Inguinal hernia    Schizophrenia (HCC)    Sleep apnea    does not wear CPAP ( per spouse )     Past Surgical History:  Procedure Laterality Date   MULTIPLE TOOTH EXTRACTIONS     ORIF HUMERUS FRACTURE Left 03/07/2019   Procedure: OPEN REDUCTION INTERNAL FIXATION (ORIF) HUMERAL SHAFT FRACTURE;  Surgeon: Roby Lofts, MD;  Location: [facility] OR;  Service: Orthopedics;  Laterality: Left;      Review of Systems  Constitutional:  Negative for chills, diaphoresis, fatigue and fever.  Respiratory:  Negative for cough, chest tightness, shortness of breath and wheezing.   Cardiovascular:  Negative for chest pain.  Gastrointestinal:  Negative for abdominal pain, diarrhea, nausea and vomiting.      Objective:    BP 136/84   Pulse 82   Temp 98.1 F (36.7 C) (Oral)   Resp 16   Ht 6\' 2"  (1.88 m)   Wt 176 lb (79.8 kg)   SpO2 96%   BMI 22.60 kg/m  Nursing note and vital signs reviewed.  Physical Exam Constitutional:       General: He is not in acute distress.    Appearance: He is well-developed.  Eyes:     Conjunctiva/sclera: Conjunctivae normal.  Cardiovascular:     Rate and Rhythm: Normal rate and regular rhythm.     Heart sounds: Normal heart sounds. No murmur heard.    No friction rub. No gallop.  Pulmonary:     Effort: Pulmonary effort is normal. No respiratory distress.     Breath sounds: Normal breath sounds. No wheezing or rales.  Chest:     Chest wall: No tenderness.  Abdominal:     General: Bowel sounds are normal.     Palpations: Abdomen is soft.     Tenderness: There is no abdominal tenderness.  Musculoskeletal:     Cervical back: Neck supple.  Lymphadenopathy:     Cervical: No cervical adenopathy.  Skin:    General: Skin is warm and dry.     Findings: No rash.  Neurological:     Mental Status: He is alert and oriented to person, place, and time.  Psychiatric:        Behavior: Behavior normal.        Thought Content: Thought content normal.        Judgment: Judgment normal.         12/01/2022    3:02 PM 12/31/2021    9:48 AM 08/03/2021    3:24 PM 06/16/2021    4:28 PM 05/12/2021    4:21 PM  Depression screen PHQ 2/9  Decreased Interest 0 0 0 0 0  Down, Depressed, Hopeless 0 0 0 0 0  PHQ - 2 Score 0 0 0 0 0       Assessment & Plan:    Patient Active Problem List   Diagnosis Date Noted   Prediabetes 12/15/2021   History of CVA with residual deficit 06/21/2021   Hyperglycemia 06/21/2021   Eczema 06/16/2021   Healthcare maintenance 05/12/2021   Hypertensive urgency 05/05/2021   Acute left-sided weakness 05/05/2021   Chest pain 05/05/2021   Alcohol abuse 05/05/2021   HIV (human immunodeficiency virus infection) ([place]) 05/05/2021   Tobacco use 05/05/2021   Polysubstance abuse ([place]) 05/05/2021   Paresthesia 05/05/2021   Schizophrenia ([place]) 05/05/2021   Right-sided headache 05/05/2021   Cerebral thrombosis with cerebral infarction 05/05/2021   Essential hypertension  03/20/2019   Opiate overdose ([place]) 03/19/2019   Leukocytosis 03/19/2019   Hypokalemia 03/19/2019   Left arm pain 03/19/2019   Elevated blood pressure reading 03/19/2019   Comminuted fracture of humerus, left, open, initial encounter 02/28/2019     Problem List Items Addressed This Visit       Cardiovascular and Mediastinum   Essential hypertension    Mr. Gangemi has stopped taking his blood pressure medications and blood pressure today appears only mildly elevated. Will defer restarting treatment and treatment regimen to Internal Medicine. Currently without  neurological/ophthalmological signs/symptoms.         Other   HIV (human immunodeficiency virus infection) ([place])    Mr. Exley confinues to have well controlled virus with good adherence and tolerance to Biktarvy. Reviewed lab work and discussed plan of care. Continue current dose of Biktarvy. Plan for follow up in 6 months or sooner if needed with lab work 1-2 weeks prior to appointment.       Relevant Medications   bictegravir-emtricitabine-tenofovir AF (BIKTARVY) 50-200-25 MG TABS tablet   Healthcare maintenance    Discussed importance of safe sexual practice and condom use. Condoms and STD testing offered.  Covid and influenza vaccination updated.       Other Visit Diagnoses     Need for immunization against influenza    -  Primary   Relevant Orders   Flu Vaccine QUAD 4mo+IM (Fluarix, Fluzone & Alfiuria Quad PF) (Completed)   HIV disease (HCC)       Relevant Medications   bictegravir-emtricitabine-tenofovir AF (BIKTARVY) 50-200-25 MG TABS tablet   Other Relevant Orders   BASIC METABOLIC PANEL WITH GFR   HIV-1 RNA quant-no reflex-bld   T-helper cells (CD4) count (not at [facility])   Screening for STDs (sexually transmitted diseases)       Relevant Orders   RPR   Pharmacologic therapy       Relevant Orders   Lipid panel        I have discontinued Harlin Heys aspirin EC, amLODipine, atorvastatin,  hydrochlorothiazide, losartan, and predniSONE. I have also changed his Biktarvy.   Meds ordered this encounter  Medications   bictegravir-emtricitabine-tenofovir AF (BIKTARVY) 50-200-25 MG TABS tablet    Sig: Take 1 tablet by mouth daily.    Dispense:  30 tablet    Refill:  6    Order Specific Question:   Supervising Provider    Answer:   Carlyle Basques [[id number]]     Follow-up: Return in about 6 months (around 06/01/2023), or if symptoms worsen or fail to improve.   Terri Piedra, MSN, FNP-C Nurse Practitioner [facility] [place] number: [phone]

## 2022-12-01 NOTE — Assessment & Plan Note (Signed)
Marcus Nichols confinues to have well controlled virus with good adherence and tolerance to [employer]. Reviewed lab work and discussed plan of care. Continue current dose of Biktarvy. Plan for follow up in 6 months or sooner if needed with lab work 1-2 weeks prior to appointment.

## 2022-12-01 NOTE — Assessment & Plan Note (Signed)
Marcus Nichols has stopped taking his blood pressure medications and blood pressure today appears only mildly elevated. Will defer restarting treatment and treatment regimen to Internal Medicine. Currently without neurological/ophthalmological signs/symptoms.

## 2023-01-19 ENCOUNTER — Other Ambulatory Visit: Payer: Self-pay

## 2023-01-19 MED ORDER — PREDNISONE 10 MG (21) PO TBPK: ORAL_TABLET | ORAL | 0 refills | Status: DC

## 2023-04-18 ENCOUNTER — Other Ambulatory Visit: Payer: Self-pay | Admitting: Family

## 2023-05-30 ENCOUNTER — Other Ambulatory Visit: Payer: Self-pay

## 2023-05-30 ENCOUNTER — Other Ambulatory Visit: Payer: Commercial Managed Care - HMO

## 2023-05-30 DIAGNOSIS — B20 Human immunodeficiency virus [HIV] disease: Secondary | ICD-10-CM

## 2023-05-30 DIAGNOSIS — Z113 Encounter for screening for infections with a predominantly sexual mode of transmission: Secondary | ICD-10-CM

## 2023-05-30 DIAGNOSIS — Z79899 Other long term (current) drug therapy: Secondary | ICD-10-CM

## 2023-05-30 MED ORDER — PREDNISONE 10 MG (21) PO TBPK: ORAL_TABLET | ORAL | 0 refills | Status: DC

## 2023-05-31 LAB — T-HELPER CELLS (CD4) COUNT (NOT AT ARMC)
CD4 % Helper T Cell: 22 % — ABNORMAL LOW (ref 33–65)
CD4 T Cell Abs: 421 /uL (ref 400–1790)

## 2023-06-01 LAB — BASIC METABOLIC PANEL WITHOUT GFR
BUN: 12 mg/dL (ref 7–25)
CO2: 28 mmol/L (ref 20–32)
Calcium: 9.5 mg/dL (ref 8.6–10.3)
Chloride: 104 mmol/L (ref 98–110)
Creat: 0.82 mg/dL (ref 0.70–1.30)
Glucose, Bld: 109 mg/dL — ABNORMAL HIGH (ref 65–99)
Potassium: 4.1 mmol/L (ref 3.5–5.3)
Sodium: 139 mmol/L (ref 135–146)
eGFR: 102 mL/min/{1.73_m2}

## 2023-06-01 LAB — HIV-1 RNA QUANT-NO REFLEX-BLD
HIV 1 RNA Quant: 28 {copies}/mL — ABNORMAL HIGH
HIV-1 RNA Quant, Log: 1.45 {Log_copies}/mL — ABNORMAL HIGH

## 2023-06-01 LAB — LIPID PANEL
Cholesterol: 156 mg/dL (ref <200)
HDL: 65 mg/dL (ref >=40)
LDL Cholesterol (Calc): 72 mg/dL (calc)
Non-HDL Cholesterol (Calc): 91 mg/dL (calc) (ref <130)
Total CHOL/HDL Ratio: 2.4 (calc) (ref <5.0)
Triglycerides: 111 mg/dL (ref <150)

## 2023-06-01 LAB — RPR: RPR Ser Ql: NONREACTIVE

## 2023-06-07 ENCOUNTER — Telehealth: Payer: Self-pay

## 2023-06-07 NOTE — Telephone Encounter (Signed)
Unable to reach patient call could not be completed.

## 2023-06-07 NOTE — Telephone Encounter (Signed)
-----   Message from Veryl Speak, FNP sent at 06/07/2023 10:55 AM EDT ----- Please inform Marcus Nichols that his lab work looks good with undetectable viral load and CD4 count 421. Kidney function, liver function and electrolytes are normal.

## 2023-06-08 NOTE — Telephone Encounter (Signed)
Second attempt to reach patient, call cannot be completed.   Sandie Ano, RN

## 2023-06-14 ENCOUNTER — Ambulatory Visit: Payer: Commercial Managed Care - HMO | Admitting: Family

## 2023-07-09 ENCOUNTER — Other Ambulatory Visit: Payer: Self-pay

## 2023-07-09 ENCOUNTER — Emergency Department (HOSPITAL_COMMUNITY)
Admission: EM | Admit: 2023-07-09 | Discharge: 2023-07-10 | Disposition: A | Discharge status: Home / Self Care | Payer: Medicaid Other | Attending: Emergency Medicine | Admitting: Emergency Medicine

## 2023-07-09 ENCOUNTER — Encounter (HOSPITAL_COMMUNITY): Payer: Self-pay

## 2023-07-09 DIAGNOSIS — J45909 Unspecified asthma, uncomplicated: Secondary | ICD-10-CM | POA: Insufficient documentation

## 2023-07-09 DIAGNOSIS — R519 Headache, unspecified: Secondary | ICD-10-CM | POA: Insufficient documentation

## 2023-07-09 DIAGNOSIS — Z7951 Long term (current) use of inhaled steroids: Secondary | ICD-10-CM | POA: Insufficient documentation

## 2023-07-09 DIAGNOSIS — Z21 Asymptomatic human immunodeficiency virus [HIV] infection status: Secondary | ICD-10-CM | POA: Insufficient documentation

## 2023-07-09 DIAGNOSIS — R21 Rash and other nonspecific skin eruption: Secondary | ICD-10-CM | POA: Insufficient documentation
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
## 2023-07-10 ENCOUNTER — Encounter (HOSPITAL_COMMUNITY): Payer: Self-pay

## 2023-07-10 ENCOUNTER — Emergency Department (HOSPITAL_COMMUNITY)
Admission: EM | Admit: 2023-07-10 | Discharge: 2023-07-10 | Disposition: A | Discharge status: Home / Self Care | Payer: Medicaid Other | Source: Home / Self Care | Attending: Emergency Medicine | Admitting: Emergency Medicine

## 2023-07-10 ENCOUNTER — Other Ambulatory Visit: Payer: Self-pay

## 2023-07-10 DIAGNOSIS — R21 Rash and other nonspecific skin eruption: Secondary | ICD-10-CM | POA: Insufficient documentation

## 2023-07-10 LAB — CBC WITH DIFFERENTIAL/PLATELET
Abs Immature Granulocytes: 0.03 10*3/uL (ref 0.00–0.07)
Basophils Absolute: 0 10*3/uL (ref 0.0–0.1)
Basophils Relative: 1 %
Eosinophils Absolute: 0.3 10*3/uL (ref 0.0–0.5)
Eosinophils Relative: 3 %
HCT: 44.5 % (ref 39.0–52.0)
Hemoglobin: 14.9 g/dL (ref 13.0–17.0)
Immature Granulocytes: 0 %
Lymphocytes Relative: 26 %
Lymphs Abs: 2.2 10*3/uL (ref 0.7–4.0)
MCH: 30.3 pg (ref 26.0–34.0)
MCHC: 33.5 g/dL (ref 30.0–36.0)
MCV: 90.4 fL (ref 80.0–100.0)
Monocytes Absolute: 0.8 10*3/uL (ref 0.1–1.0)
Monocytes Relative: 10 %
Neutro Abs: 5 10*3/uL (ref 1.7–7.7)
Neutrophils Relative %: 60 %
Platelets: 252 10*3/uL (ref 150–400)
RBC: 4.92 MIL/uL (ref 4.22–5.81)
RDW: 13.6 % (ref 11.5–15.5)
WBC: 8.3 10*3/uL (ref 4.0–10.5)
nRBC: 0 % (ref 0.0–0.2)

## 2023-07-10 LAB — BASIC METABOLIC PANEL
Anion gap: 9 (ref 5–15)
BUN: 13 mg/dL (ref 6–20)
CO2: 25 mmol/L (ref 22–32)
Calcium: 8.9 mg/dL (ref 8.9–10.3)
Chloride: 99 mmol/L (ref 98–111)
Creatinine, Ser: 0.95 mg/dL (ref 0.61–1.24)
GFR, Estimated: >60 mL/min (ref >60)
Glucose, Bld: 100 mg/dL — ABNORMAL HIGH (ref 70–99)
Potassium: 3.9 mmol/L (ref 3.5–5.1)
Sodium: 133 mmol/L — ABNORMAL LOW (ref 135–145)

## 2023-07-10 MED ORDER — DOXYCYCLINE HYCLATE 100 MG PO CAPS: 100.0000 mg | ORAL_CAPSULE | Freq: Two times a day (BID) | ORAL | 0 refills | Status: DC

## 2023-07-10 MED ORDER — SODIUM CHLORIDE 0.9 % IV BOLUS
1000.0000 mL | Freq: Once | INTRAVENOUS | Status: AC
  Administered 2023-07-10: 1000 mL via INTRAVENOUS

## 2023-07-10 MED ORDER — DOXYCYCLINE HYCLATE 100 MG PO TABS
100.0000 mg | ORAL_TABLET | Freq: Once | ORAL | Status: AC
  Administered 2023-07-10: 100 mg via ORAL
  Filled 2023-07-10: qty 1

## 2023-07-10 MED ORDER — ACETAMINOPHEN 325 MG PO TABS
650.0000 mg | ORAL_TABLET | Freq: Once | ORAL | Status: AC
  Administered 2023-07-10: 650 mg via ORAL
  Filled 2023-07-10: qty 2

## 2023-07-10 NOTE — ED Provider Notes (Signed)
  [place] EMERGENCY DEPARTMENT AT [facility] Provider Note   CSN: [id number] Arrival date & time: 07/10/23  2841     History  Chief Complaint  Patient presents with   Rash    Danyael Yi is a 57 y.o. male.  This is a 57 year old male with a history of HIV who returns to the emergency department today after being seen last evening for rash.  Patient was seen last night for this rash that occurred, there is question of whether or not this was Mpox.  Patient had swab done, discharged with doxycycline.  No new complaints.   Rash      Home Medications Prior to Admission medications   Medication Sig Start Date End Date Taking? Authorizing Provider  bictegravir-emtricitabine-tenofovir AF (BIKTARVY) 50-200-25 MG TABS tablet Take 1 tablet by mouth daily. 12/01/22   Veryl Speak, FNP  doxycycline (VIBRAMYCIN) 100 MG capsule Take 1 capsule (100 mg total) by mouth 2 (two) times daily. 07/10/23   Jeannie Fend, PA-C  predniSONE (STERAPRED UNI-PAK 21 TAB) 10 MG (21) TBPK tablet Take per package instructions 05/30/23   Veryl Speak, FNP  gabapentin (NEURONTIN) 300 MG capsule Take 2 capsules (600 mg total) by mouth 2 (two) times daily. Patient not taking: Reported on 03/28/2020 03/20/19 12/07/20  Cathren Harsh, MD      Allergies    Grass pollen(k-o-r-t-swt vern)    Review of Systems   Review of Systems  Skin:  Positive for rash.    Physical Exam Updated Vital Signs BP (!) 167/85 (BP Location: Left Leg)   Pulse 74   Temp 97.8 F (36.6 C) (Oral)   Resp 18   SpO2 100%  Physical Exam Vitals reviewed.  Skin:    Comments: Blistering rash over the dorsum of the right hand, forehead     ED Results / Procedures / Treatments   Labs (all labs ordered are listed, but only abnormal results are displayed) Labs Reviewed - No data to display  EKG None  Radiology No results found.  Procedures Procedures    Medications Ordered in ED Medications  sodium  chloride 0.9 % bolus 1,000 mL (1,000 mLs Intravenous New Bag/Given 07/10/23 0931)    ED Course/ Medical Decision Making/ A&P                                 Medical Decision Making 57 year old male here today for reassessment of his rash.  Plan-does not appear to be a zoster rash, nor HSV.  I do of concern for Mpox.  Discussed this with the patient.  Still awaiting results.  He will continue to take doxycycline.  He will be alerted about result.  Will discharge home.  Patient requested a liter of fluid, did provide.           Final Clinical Impression(s) / ED Diagnoses Final diagnoses:  Rash    Rx / DC Orders ED Discharge Orders     None         Arletha Pili, DO 07/10/23 1037

## 2023-07-10 NOTE — ED Triage Notes (Signed)
Pt came in via POV d/t a rash on his Rt arm he was seen for yesterday & is here requesting the results from yesterday's labs of what the rash is. A/Ox4, denies pain.

## 2023-07-10 NOTE — Discharge Instructions (Addendum)
Your Mpox test result has not yet come back.  Sometimes this can take a couple of days.  You will be contacted if it is positive.  Please continue to isolate.  You may take all of your medications as prescribed.  Please follow-up with your primary care doctor within 2 to 3 days.  Come back to the emergency department if you develop difficulty breathing, rash on your tongue, lips or mouth.

## 2023-07-10 NOTE — Discharge Instructions (Addendum)
Home to rest and isolate pending test results.  Take Tylenol as needed as directed for headaches. Take Doxycycline as prescribed for possible secondary infection.

## 2023-07-10 NOTE — ED Provider Notes (Signed)
[place] EMERGENCY DEPARTMENT AT [facility] Provider Note   CSN: [id number] Arrival date & time: 07/09/23  2046     History  Chief Complaint  Patient presents with   Rash    Marcus Nichols is a 57 y.o. male.  57 year old male with past medical history of HIV, eczema, asthma, schizophrenia presents with concern for a rash.  Patient first noticed the rash on Sunday morning on his right hand.  He reports discomfort and itching with the rash which has since spread up his right arm across his forehead, onto his left arm and a few spots on his trunk.  No oral lesions.  No recent travel, no sick contacts no fevers.  Does report a mild headache.       Home Medications Prior to Admission medications   Medication Sig Start Date End Date Taking? Authorizing Provider  doxycycline (VIBRAMYCIN) 100 MG capsule Take 1 capsule (100 mg total) by mouth 2 (two) times daily. 07/10/23  Yes Jeannie Fend, PA-C  bictegravir-emtricitabine-tenofovir AF (BIKTARVY) 50-200-25 MG TABS tablet Take 1 tablet by mouth daily. 12/01/22   Veryl Speak, FNP  predniSONE (STERAPRED UNI-PAK 21 TAB) 10 MG (21) TBPK tablet Take per package instructions 05/30/23   Veryl Speak, FNP  gabapentin (NEURONTIN) 300 MG capsule Take 2 capsules (600 mg total) by mouth 2 (two) times daily. Patient not taking: Reported on 03/28/2020 03/20/19 12/07/20  Cathren Harsh, MD      Allergies    Grass pollen(k-o-r-t-swt vern)    Review of Systems   Review of Systems Negative except as per HPI Physical Exam Updated Vital Signs BP (!) 150/100   Pulse 66   Temp 98.3 F (36.8 C)   Resp 16   Ht 6\' 2"  (1.88 m)   Wt 83.9 kg   SpO2 99%   BMI 23.75 kg/m  Physical Exam Vitals and nursing note reviewed.  Constitutional:      General: He is not in acute distress.    Appearance: He is well-developed. He is not diaphoretic.  HENT:     Head: Normocephalic and atraumatic.  Pulmonary:     Effort: Pulmonary effort is  normal.  Musculoskeletal:        General: Tenderness present. No swelling.  Skin:    General: Skin is warm and dry.     Findings: Rash present.  Neurological:     Mental Status: He is alert and oriented to person, place, and time.  Psychiatric:        Behavior: Behavior normal.              ED Results / Procedures / Treatments   Labs (all labs ordered are listed, but only abnormal results are displayed) Labs Reviewed  BASIC METABOLIC PANEL - Abnormal; Notable for the following components:      Result Value   Sodium 133 (*)    Glucose, Bld 100 (*)    All other components within normal limits  MONKEYPOX VIRUS DNA, QUALITATIVE REAL-TIME PCR  CBC WITH DIFFERENTIAL/PLATELET    EKG None  Radiology No results found.  Procedures Procedures    Medications Ordered in ED Medications  acetaminophen (TYLENOL) tablet 650 mg (has no administration in time range)  doxycycline (VIBRA-TABS) tablet 100 mg (100 mg Oral Given 07/10/23 0215)    ED Course/ Medical Decision Making/ A&P  Medical Decision Making Amount and/or Complexity of Data Reviewed Labs: ordered.  Risk OTC drugs. Prescription drug management.   57 year old male presents for evaluation of rash which he is reports having just started Sunday morning on his hand and spreading rapidly up his arm, across his forehead to his left arm and down his trunk.  Lesions are equalizing to the right hand along the first digit and across the forehead, more scattered isolated elsewhere.  Somewhat of a vesicular/purulent vesicle appearance, few of the lesions are noted to have an erythematous base.  Patient does have a history of HIV, is medically compliant with last labs obtained at the beginning of July with CD4 greater than 350.  He notes having headache, denies oral lesions, no known contact with other similar to his rash, no recent travel, no fevers.  Differential considered including  shingles, smallpox, varicella, monkeypox.  Given diffuse and not unilateral presentation, doubt zoster.  Lesions were swabbed for monkeypox, baseline labs obtained including CBC and BMP without significant findings.  Secure chat sent to on-call ID for follow-up Test result purposes.  Patient is advised to isolate pending his results.  Recommend recheck with his PCP/ID.  Return to ED for any worsening or concerning symptoms.  He was started on doxycycline due to concern for possible secondary infection.        Final Clinical Impression(s) / ED Diagnoses Final diagnoses:  Rash    Rx / DC Orders ED Discharge Orders          Ordered    doxycycline (VIBRAMYCIN) 100 MG capsule  2 times daily        08 /12/24 0224              Jeannie Fend, PA-C 07/10/23 0242    Marily Memos, MD 07/10/23 2302

## 2023-07-12 ENCOUNTER — Other Ambulatory Visit: Payer: Self-pay | Admitting: Family

## 2023-08-07 ENCOUNTER — Ambulatory Visit: Payer: Medicaid Other | Admitting: Family

## 2023-08-07 ENCOUNTER — Other Ambulatory Visit: Payer: Self-pay

## 2023-08-16 ENCOUNTER — Other Ambulatory Visit: Payer: Self-pay | Admitting: Family

## 2023-08-16 DIAGNOSIS — B20 Human immunodeficiency virus [HIV] disease: Secondary | ICD-10-CM

## 2023-10-16 ENCOUNTER — Other Ambulatory Visit (HOSPITAL_COMMUNITY): Payer: Self-pay

## 2023-10-16 ENCOUNTER — Telehealth: Payer: Self-pay

## 2023-10-16 ENCOUNTER — Other Ambulatory Visit: Payer: Self-pay | Admitting: Family

## 2023-10-16 NOTE — Telephone Encounter (Signed)
Patient called to schedule an appt with Marcos Eke. Did offer him an earlier appt with another provider, but declined and set with Tammy Sours on 11/28/2023 (next available).  Patient did request if we were able to send in refills for his medications. He specifically asked for the prednisone and stated that he is currently broken out right now.

## 2023-10-16 NOTE — Telephone Encounter (Signed)
Continue

## 2023-10-17 ENCOUNTER — Other Ambulatory Visit (HOSPITAL_COMMUNITY): Payer: Self-pay

## 2023-10-17 ENCOUNTER — Other Ambulatory Visit: Payer: Self-pay

## 2023-10-17 ENCOUNTER — Other Ambulatory Visit: Payer: Self-pay | Admitting: Family

## 2023-10-17 MED ORDER — PREDNISONE 10 MG (21) PO TBPK: ORAL_TABLET | ORAL | 0 refills | Status: DC

## 2023-10-17 MED ORDER — PREDNISONE 10 MG (21) PO TBPK
ORAL_TABLET | ORAL | 0 refills | Status: DC
  Filled 2023-10-17: qty 21, 6d supply, fill #0

## 2023-10-17 NOTE — Telephone Encounter (Signed)
Refill sent. Called patient to advise him to be sure to keep the appointment.  No answer and VM full.

## 2023-10-19 ENCOUNTER — Other Ambulatory Visit: Payer: Self-pay

## 2023-10-19 ENCOUNTER — Encounter (HOSPITAL_COMMUNITY): Payer: Self-pay

## 2023-10-19 ENCOUNTER — Emergency Department (HOSPITAL_COMMUNITY): Payer: Medicaid Other

## 2023-10-19 ENCOUNTER — Emergency Department (HOSPITAL_COMMUNITY)
Admission: EM | Admit: 2023-10-19 | Discharge: 2023-10-19 | Disposition: A | Discharge status: Home / Self Care | Payer: Medicaid Other

## 2023-10-19 DIAGNOSIS — L309 Dermatitis, unspecified: Secondary | ICD-10-CM | POA: Diagnosis not present

## 2023-10-19 DIAGNOSIS — Z7952 Long term (current) use of systemic steroids: Secondary | ICD-10-CM | POA: Diagnosis not present

## 2023-10-19 DIAGNOSIS — J45909 Unspecified asthma, uncomplicated: Secondary | ICD-10-CM | POA: Diagnosis not present

## 2023-10-19 DIAGNOSIS — Z79899 Other long term (current) drug therapy: Secondary | ICD-10-CM | POA: Insufficient documentation

## 2023-10-19 DIAGNOSIS — I10 Essential (primary) hypertension: Secondary | ICD-10-CM | POA: Diagnosis not present

## 2023-10-19 DIAGNOSIS — Z21 Asymptomatic human immunodeficiency virus [HIV] infection status: Secondary | ICD-10-CM | POA: Diagnosis not present

## 2023-10-19 DIAGNOSIS — R0789 Other chest pain: Secondary | ICD-10-CM

## 2023-10-19 LAB — CBC
HCT: 43.1 % (ref 39.0–52.0)
Hemoglobin: 14.8 g/dL (ref 13.0–17.0)
MCH: 31.4 pg (ref 26.0–34.0)
MCHC: 34.3 g/dL (ref 30.0–36.0)
MCV: 91.3 fL (ref 80.0–100.0)
Platelets: 247 10*3/uL (ref 150–400)
RBC: 4.72 MIL/uL (ref 4.22–5.81)
RDW: 14 % (ref 11.5–15.5)
WBC: 6 10*3/uL (ref 4.0–10.5)
nRBC: 0 % (ref 0.0–0.2)

## 2023-10-19 LAB — BASIC METABOLIC PANEL
Anion gap: 8 (ref 5–15)
BUN: 9 mg/dL (ref 6–20)
CO2: 23 mmol/L (ref 22–32)
Calcium: 9 mg/dL (ref 8.9–10.3)
Chloride: 106 mmol/L (ref 98–111)
Creatinine, Ser: 0.9 mg/dL (ref 0.61–1.24)
GFR, Estimated: >60 mL/min (ref >60)
Glucose, Bld: 96 mg/dL (ref 70–99)
Potassium: 3.8 mmol/L (ref 3.5–5.1)
Sodium: 137 mmol/L (ref 135–145)

## 2023-10-19 LAB — HEPATIC FUNCTION PANEL
ALT: 26 U/L (ref 0–44)
AST: 30 U/L (ref 15–41)
Albumin: 3.8 g/dL (ref 3.5–5.0)
Alkaline Phosphatase: 84 U/L (ref 38–126)
Bilirubin, Direct: 0.2 mg/dL (ref 0.0–0.2)
Indirect Bilirubin: 1.2 mg/dL — ABNORMAL HIGH (ref 0.3–0.9)
Total Bilirubin: 1.4 mg/dL — ABNORMAL HIGH (ref <1.2)
Total Protein: 7.8 g/dL (ref 6.5–8.1)

## 2023-10-19 LAB — TROPONIN I (HIGH SENSITIVITY)
Troponin I (High Sensitivity): 7 ng/L (ref <18)
Troponin I (High Sensitivity): 6 ng/L (ref <18)

## 2023-10-19 MED ORDER — AMLODIPINE BESYLATE 5 MG PO TABS
5.0000 mg | ORAL_TABLET | Freq: Once | ORAL | Status: AC
  Administered 2023-10-19: 5 mg via ORAL
  Filled 2023-10-19: qty 1

## 2023-10-19 MED ORDER — DEXAMETHASONE SODIUM PHOSPHATE 10 MG/ML IJ SOLN
8.0000 mg | Freq: Once | INTRAMUSCULAR | Status: AC
  Administered 2023-10-19: 8 mg via INTRAMUSCULAR
  Filled 2023-10-19: qty 1

## 2023-10-19 MED ORDER — AMLODIPINE BESYLATE 5 MG PO TABS: 5.0000 mg | ORAL_TABLET | Freq: Every day | ORAL | 0 refills | Status: DC

## 2023-10-19 MED ORDER — METHYLPREDNISOLONE SODIUM SUCC 125 MG IJ SOLR: 125.0000 mg | Freq: Once | INTRAMUSCULAR | Status: DC

## 2023-10-19 MED ORDER — METHYLPREDNISOLONE 4 MG PO TBPK: ORAL_TABLET | ORAL | 0 refills | Status: DC

## 2023-10-19 NOTE — ED Triage Notes (Addendum)
Pt c/o chest pain that started a few days ago. Pt denies nausea/vomiting. Pt has had shortness of breath intermittently. Pt states he has a rash similar to rash he was previously seen for in August. Pt has small bumps all over that itch and burn.

## 2023-10-19 NOTE — ED Provider Notes (Signed)
[name] EMERGENCY DEPARTMENT AT [facility] Provider Note   CSN: [id number] Arrival date & time: 10/19/23  1343     History  Chief Complaint  Patient presents with   Chest Pain   Rash    Marcus Nichols is a 57 y.o. male.  57 year old male with past medical history of HIV on Hart therapy with undetectable viral load as well as history of asthma presenting to the emergency department today with concern for chest discomfort and rash.  The patient states that the chest discomfort started couple of days ago.  He states that it is mostly in the middle of his chest and does not radiate.  The patient denies any significant shortness of breath.  He has had a mild nonproductive cough.  He denies any leg pain or swelling.  Denies a history of DVT or pulmonary embolism, recent surgeries, recent travel.  The patient states that he is coming in with a recurrent rash as well.  Reports that he was seen over the summer and was tested for monkeypox and this was negative.  He states that he does have this itchy rash that comes up from time to time.  Reports that there are some areas of his skin that are raw now where it is gotten very dry.  He reports that it is worse if he showers or sweats.  He denies any recent fevers.   Chest Pain Rash      Home Medications Prior to Admission medications   Medication Sig Start Date End Date Taking? Authorizing Provider  amLODipine (NORVASC) 5 MG tablet Take 1 tablet (5 mg total) by mouth daily. 10/19/23  Yes Durwin Glaze, MD  methylPREDNISolone (MEDROL DOSEPAK) 4 MG TBPK tablet Take as directed on Dosepak 10/19/23  Yes Durwin Glaze, MD  bictegravir-emtricitabine-tenofovir AF (BIKTARVY) 50-200-25 MG TABS tablet Take 1 tablet by mouth daily. NEED APPOINTMENT FOR FUTURE REFILLS 08/16/23   Veryl Speak, FNP  doxycycline (VIBRAMYCIN) 100 MG capsule Take 1 capsule (100 mg total) by mouth 2 (two) times daily. 07/10/23   Jeannie Fend, PA-C  predniSONE  (STERAPRED UNI-PAK 21 TAB) 10 MG (21) TBPK tablet Take per package instructions 10/17/23   Veryl Speak, FNP  gabapentin (NEURONTIN) 300 MG capsule Take 2 capsules (600 mg total) by mouth 2 (two) times daily. Patient not taking: Reported on 03/28/2020 03/20/19 12/07/20  Cathren Harsh, MD      Allergies    Grass pollen(k-o-r-t-swt vern)    Review of Systems   Review of Systems  Cardiovascular:  Positive for chest pain.  Skin:  Positive for rash.  All other systems reviewed and are negative.   Physical Exam Updated Vital Signs BP (!) 208/98 (BP Location: Right Arm)   Pulse 84   Temp 98 F (36.7 C) (Oral)   Resp 16   Ht 6\' 2"  (1.88 m)   Wt 90.7 kg   SpO2 100%   BMI 25.68 kg/m  Physical Exam Vitals and nursing note reviewed.   Gen: NAD Eyes: PERRL, EOMI HEENT: no oropharyngeal swelling Neck: trachea midline Resp: clear to auscultation bilaterally Card: RRR, no murmurs, rubs, or gallops Abd: nontender, nondistended Extremities: no calf tenderness, no edema Vascular: 2+ radial pulses bilaterally, 2+ DP pulses bilaterally Skin: The patient has an eczematous rash noted over his upper and lower extremities mostly over the flexor surfaces with no overlying erythema or findings kissing with cellulitis.  No vesicular lesions noted. Psyc: acting appropriately  ED Results / Procedures / Treatments   Labs (all labs ordered are listed, but only abnormal results are displayed) Labs Reviewed  HEPATIC FUNCTION PANEL - Abnormal; Notable for the following components:      Result Value   Total Bilirubin 1.4 (*)    Indirect Bilirubin 1.2 (*)    All other components within normal limits  BASIC METABOLIC PANEL  CBC  TROPONIN I (HIGH SENSITIVITY)  TROPONIN I (HIGH SENSITIVITY)    EKG EKG Interpretation Date/Time:  Thursday October 19 2023 13:25:41 EST Ventricular Rate:  82 PR Interval:  170 QRS Duration:  80 QT Interval:  392 QTC Calculation: 457 R Axis:   75  Text  Interpretation: Normal sinus rhythm Minimal voltage criteria for LVH, may be normal variant ( Sokolow-Lyon ) Nonspecific ST and T wave abnormality Abnormal ECG When compared with ECG of 06-May-2021 05:02, PREVIOUS ECG IS PRESENT Confirmed by Beckey Downing [phone]) on 10/19/2023 4:16:51 PM  Radiology DG Chest 2 View  Result Date: 10/19/2023 CLINICAL DATA:  Chest pain. EXAM: CHEST - 2 VIEW COMPARISON:  05/04/2021. FINDINGS: Bilateral lung fields are clear. Bilateral costophrenic angles are clear. Normal cardio-mediastinal silhouette. No acute osseous abnormalities. The soft tissues are within normal limits. IMPRESSION: *No active cardiopulmonary disease. Electronically Signed   By: Jules Schick M.D.   On: 10/19/2023 15:39    Procedures Procedures    Medications Ordered in ED Medications  amLODipine (NORVASC) tablet 5 mg (has no administration in time range)  methylPREDNISolone sodium succinate (SOLU-MEDROL) 125 mg/2 mL injection 125 mg (has no administration in time range)    ED Course/ Medical Decision Making/ A&P                                 Medical Decision Making 57 year old male with past medical history of asthma and HIV presenting to the emergency department today with chest pain.  He reports is more of a pressure sensation has been going now for the last day or so.  I will further evaluate him here with basic labs Wels and EKG, chest x-ray, and troponin for further evaluation for ACS, pulmonary edema, pulmonary infiltrates, or pneumothorax.  Based on his reassuring neurovascular exam and vital suspicion for aortic dissection or pulmonary embolism is low at this time.  His rash seems consistent with eczema here.  I will reevaluate but if he is discharged I will give the patient a short course of oral steroids as he does appear to have significant lesions over his arms and legs.  The patient's cardiac workup is reassuring here.  The patient's EKG interpreted by me shows a sinus rhythm  with nonspecific ST-T changes.  He had 2 troponins here that were negative.  The patient's blood pressure was elevated on reassessment here.  He does not have any symptoms consistent with endorgan dysfunction.  I have reviewed his chart it does appear that he has been hypertensive on multiple visits in the past.  He is not on anything for blood pressure.  I did talk to the patient about blood pressure and I will start the patient on amlodipine here.  He will be given a dose here.  He is encouraged to follow-up with his primary care provider for this to decrease the risk of stroke or MI.  I did discuss this with the patient.  For his eczema this does appear to be a relatively diffuse exacerbation.  I will give  the patient oral steroids here for this.  I have counseled him on topical moisturizers and he is encouraged to follow-up with his doctor for reevaluation.  He is discharged with return precautions.  Amount and/or Complexity of Data Reviewed Labs: ordered. Radiology: ordered.  Risk Prescription drug management.           Final Clinical Impression(s) / ED Diagnoses Final diagnoses:  Uncontrolled hypertension  Chest discomfort  Eczema, unspecified type    Rx / DC Orders ED Discharge Orders          Ordered    amLODipine (NORVASC) 5 MG tablet  Daily        10/19/23 1948    methylPREDNISolone (MEDROL DOSEPAK) 4 MG TBPK tablet        10/19/23 1948              Durwin Glaze, MD 10/19/23 1950

## 2023-10-19 NOTE — Discharge Instructions (Addendum)
Please start the blood pressure medication and take this daily.  Take the steroid pack as directed.  Start using Eucerin cream or Vaseline on your arms and legs to help moisturize them.  Please follow-up with your doctor to have your blood pressure rechecked as well as to follow-up on your eczema.  Return to the ER for worsening symptoms.  Thank you for the opportunity to take care of you in our Emergency Department. You have been diagnosed with high blood pressure, also known as hypertension. This means that the force of blood against the walls of your blood vessels called is too strong. It also means that your heart has to work harder to move the blood. High blood pressure usually has no symptoms, but over time, it can cause serious health problems such as Heart attack and heart failure Stroke Kidney disease and failure Vision loss With the help from your healthcare provider and some important life style changes, you can manage your blood pressure and protect your health. Please read the instructions provided on hypertension, how to manage it and how to check your blood pressure. Additionally, use the blood pressure log provided to record your blood pressures. Take the blood pressure log with you to your primary care doctor so that they can adjust your blood pressure medications if needed. Please read the instructions on follow-up appointment. Return to the ER or Call 911 right away if you have any of these symptoms: Chest pain or shortness of breath Severe headache Weakness, tingling, or numbness of your face, arms, or legs (especially on 1 side of the body) Sudden change in vision Confusion, trouble speaking, or trouble understanding speech

## 2023-10-28 ENCOUNTER — Other Ambulatory Visit (HOSPITAL_COMMUNITY): Payer: Self-pay

## 2023-11-24 ENCOUNTER — Telehealth: Payer: Self-pay

## 2023-11-24 ENCOUNTER — Emergency Department (HOSPITAL_COMMUNITY)
Admission: EM | Admit: 2023-11-24 | Discharge: 2023-11-24 | Disposition: A | Discharge status: Home / Self Care | Payer: Medicaid Other | Attending: Emergency Medicine | Admitting: Emergency Medicine

## 2023-11-24 DIAGNOSIS — J45909 Unspecified asthma, uncomplicated: Secondary | ICD-10-CM | POA: Diagnosis not present

## 2023-11-24 DIAGNOSIS — L259 Unspecified contact dermatitis, unspecified cause: Secondary | ICD-10-CM | POA: Diagnosis not present

## 2023-11-24 DIAGNOSIS — Z21 Asymptomatic human immunodeficiency virus [HIV] infection status: Secondary | ICD-10-CM | POA: Insufficient documentation

## 2023-11-24 DIAGNOSIS — R21 Rash and other nonspecific skin eruption: Secondary | ICD-10-CM | POA: Diagnosis present

## 2023-11-24 DIAGNOSIS — L309 Dermatitis, unspecified: Secondary | ICD-10-CM

## 2023-11-24 MED ORDER — DEXAMETHASONE SODIUM PHOSPHATE 10 MG/ML IJ SOLN
8.0000 mg | Freq: Once | INTRAMUSCULAR | Status: AC
  Administered 2023-11-24: 8 mg via INTRAMUSCULAR
  Filled 2023-11-24: qty 1

## 2023-11-24 NOTE — Telephone Encounter (Signed)
Patient walked into clinic today requesting BP check also for flu shot. Pt's bp was 166/90. Advised he should keep an eye on blood pressure. If elevated will need to contact PCP. Verbalized understanding. During BP check patient had concerns regarding eczema on both arms. States he would like a shot to help with that.  Advised patient to contact PCP regarding his concerns for eczema. Will stop by there today. Patient requested to hold off on flu shot until see by Carver Fila, FNP.  Juanita Laster, RMA

## 2023-11-24 NOTE — ED Triage Notes (Signed)
Pt c/o generalized rash to upper torso and extremities for the last week.
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
## 2023-11-24 NOTE — Discharge Instructions (Signed)
It was a pleasure caring for today.  Please follow-up with primary care provider and dermatologist.  Seek emergency care if experiencing any new or worsening symptoms.

## 2023-11-28 ENCOUNTER — Ambulatory Visit: Payer: Medicaid Other | Admitting: Family

## 2023-11-28 ENCOUNTER — Telehealth: Payer: Self-pay

## 2023-11-28 NOTE — Telephone Encounter (Signed)
 Pt arrived for his 11am appt at 11:45am stating that he thought it was supposed to be at 12p today. I checked with clinical team if he can still be seen, but he was past our 15 min grace period and our office is now closed.   I explained to him to that we will need to reschedule due to it being 45 min outside of his appt time. He was not satisfied and wanted to be seen due to having to take an uber. I explained that I can schedule for this week to have labs and financial done if he would like or even see if another provider is available earlier. He stated just do whatever I offered options again to confirm if this would work for him. He stated that he was trying to get his medication switched and everything he needed to do today, but I explained again that the office at this time is now closed for the rest of the day and do not have anyone available.   He did ask if he has anything with his PCP - Dr. Vicci, but I was unable to confirm, for I did not see in his upcoming appts and she is not a provider with our office.   He continued to walk out of the office and stated I'm just done and don't even worry about it.

## 2023-11-30 NOTE — Telephone Encounter (Signed)
 Left voicemail requesting patient call office back.  Will need to reschedule visit.  Juanita Laster, RMA

## 2023-12-04 ENCOUNTER — Ambulatory Visit: Payer: Self-pay | Admitting: *Deleted

## 2023-12-04 NOTE — Telephone Encounter (Signed)
  Chief Complaint: Left leg pain, wants to discuss amlodipine  and BP, eczema  Symptoms: Left leg is shaking in his sleep per his mother Frequency: A long time Pertinent Negatives: Patient denies N/A Disposition: [] ED /[] Urgent Care (no appt availability in office) / [x] Appointment(In office/virtual)/ []  [facility]/ [] Home Care/ [] Refused Recommended Disposition /[] [place] [facility]/ []  Follow-up with PCP Additional Notes: Appt made with Dr. Vicci for 12/05/2023 at 4:10.

## 2023-12-04 NOTE — Telephone Encounter (Signed)
 Reason for Disposition  [1] MODERATE pain (e.g., interferes with normal activities, limping) AND [2] present > 3 days  Answer Assessment - Initial Assessment Questions 1. ONSET: When did the pain start?      My leg has been hurting.     My BP was high so they gave me amlodipine .    Then they took me off of it and now I'm back on it.     My leg is shaking in my sleep per my mother.   When get up it's hard to get up because my left leg is weak.      The hospital said it's eczema.      I've been to the hospital 5 times.    I just need to come in and see Dr. Vicci. 2. LOCATION: Where is the pain located?      Left leg eczema is giving me an infection per the hospital.    My BP is all over the place and I have asthma too.   3. PAIN: How bad is the pain?    (Scale 1-10; or mild, moderate, severe)   -  MILD (1-3): doesn't interfere with normal activities    -  MODERATE (4-7): interferes with normal activities (e.g., work or school) or awakens from sleep, limping    -  SEVERE (8-10): excruciating pain, unable to do any normal activities, unable to walk     Mild 4. WORK OR EXERCISE: Has there been any recent work or exercise that involved this part of the body?      No 5. CAUSE: What do you think is causing the leg pain?     I don't know 6. OTHER SYMPTOMS: Do you have any other symptoms? (e.g., chest pain, back pain, breathing difficulty, swelling, rash, fever, numbness, weakness)     See above 7. PREGNANCY: Is there any chance you are pregnant? When was your last menstrual period?     N/A  Protocols used: Leg Pain-A-AH

## 2023-12-05 ENCOUNTER — Ambulatory Visit: Payer: Medicaid Other | Attending: Internal Medicine | Admitting: Internal Medicine

## 2023-12-05 VITALS — BP 140/60 | HR 100 | Temp 97.6°F | Ht 74.0 in | Wt 191.0 lb

## 2023-12-05 DIAGNOSIS — F17210 Nicotine dependence, cigarettes, uncomplicated: Secondary | ICD-10-CM

## 2023-12-05 DIAGNOSIS — L309 Dermatitis, unspecified: Secondary | ICD-10-CM

## 2023-12-05 DIAGNOSIS — Z139 Encounter for screening, unspecified: Secondary | ICD-10-CM

## 2023-12-05 DIAGNOSIS — F1921 Other psychoactive substance dependence, in remission: Secondary | ICD-10-CM

## 2023-12-05 DIAGNOSIS — Z5900 Homelessness unspecified: Secondary | ICD-10-CM

## 2023-12-05 DIAGNOSIS — I10 Essential (primary) hypertension: Secondary | ICD-10-CM | POA: Diagnosis not present

## 2023-12-05 DIAGNOSIS — F320 Major depressive disorder, single episode, mild: Secondary | ICD-10-CM

## 2023-12-05 DIAGNOSIS — F209 Schizophrenia, unspecified: Secondary | ICD-10-CM

## 2023-12-05 DIAGNOSIS — R7303 Prediabetes: Secondary | ICD-10-CM

## 2023-12-05 DIAGNOSIS — Z5941 Food insecurity: Secondary | ICD-10-CM

## 2023-12-05 DIAGNOSIS — Z5986 Financial insecurity: Secondary | ICD-10-CM

## 2023-12-05 DIAGNOSIS — F17200 Nicotine dependence, unspecified, uncomplicated: Secondary | ICD-10-CM

## 2023-12-05 DIAGNOSIS — Z21 Asymptomatic human immunodeficiency virus [HIV] infection status: Secondary | ICD-10-CM

## 2023-12-05 LAB — GLUCOSE, POCT (MANUAL RESULT ENTRY): POC Glucose: 127 mg/dL — AB (ref 70–99)

## 2023-12-05 LAB — POCT GLYCOSYLATED HEMOGLOBIN (HGB A1C): HbA1c, POC (controlled diabetic range): 5.7 % (ref 0.0–7.0)

## 2023-12-05 MED ORDER — ASPIRIN 81 MG PO TBEC
81.0000 mg | DELAYED_RELEASE_TABLET | Freq: Every day | ORAL | 1 refills | Status: AC
  Filled 2023-12-05: qty 30, 30d supply, fill #0
  Filled 2024-01-08: qty 30, 30d supply, fill #1
  Filled 2024-02-05: qty 30, 30d supply, fill #2
  Filled 2024-03-14: qty 30, 30d supply, fill #3
  Filled 2024-04-12 – 2024-06-06 (×2): qty 30, 30d supply, fill #4

## 2023-12-05 MED ORDER — TRIAMCINOLONE ACETONIDE 0.025 % EX OINT
1.0000 | TOPICAL_OINTMENT | Freq: Two times a day (BID) | CUTANEOUS | 1 refills | Status: DC
Start: 2023-12-05 — End: 2024-01-11
  Filled 2023-12-05: qty 80, 28d supply, fill #0
  Filled 2023-12-28: qty 45, 23d supply, fill #1

## 2023-12-05 MED ORDER — AMLODIPINE BESYLATE 10 MG PO TABS
10.0000 mg | ORAL_TABLET | Freq: Every day | ORAL | 1 refills | Status: AC
  Filled 2023-12-05: qty 30, 30d supply, fill #0
  Filled 2024-01-08: qty 30, 30d supply, fill #1
  Filled 2024-02-05: qty 30, 30d supply, fill #2
  Filled 2024-03-14: qty 30, 30d supply, fill #3
  Filled 2024-04-12: qty 30, 30d supply, fill #4
  Filled 2024-06-06 – 2024-10-04 (×2): qty 30, 30d supply, fill #5

## 2023-12-05 NOTE — Progress Notes (Signed)
 Patient ID: Marcus Nichols, male    DOB: Feb 06, 1966  MRN: [id number]  CC: Leg Pain (L & R leg pain, discharge & leakage /Requesting referral for dermatologist )   Subjective: Marcus Nichols is a 58 y.o. male who presents for chronic ds management. 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  History of Present Illness   The patient, with a history of hypertension, stroke, and schizophrenia, presents with a persistent, itchy, and weeping rash that has been present for about three weeks. The rash is extensive, covering his legs, arms, and back. He has sought care at the emergency room several times for this since 06/2023.  Reports being told that it is eczema and was prescribed some prednisone  pills. He has not been using any creams or ointments on the rash.   HTN: He has been taking amlodipine  5mg  since his last ER visit in December. He has not been on cholesterol medication for a while, despite a history of stroke. He smokes two to three cigars a day and drinks alcohol occasionally. He has a history of cocaine and mafijuana use but has quit.  MDD: Positive depression screen.  He has been experiencing depression, which he attributes to unstable housing and life difficulties.  Has history of schizophrenia.  Tells me that he used to go to [facility] but not anymore.  HIV: Looks like he has missed the last 3-4 follow-up appointments with ID.  He tells me that he has not missed appointments that he is aware of.  He states they has only been drawing his blood.  He tells me he has been compliant with taking Biktarvy .       Patient Active Problem List   Diagnosis Date Noted   Prediabetes 12/15/2021   History of CVA with  residual deficit 06/21/2021   Hyperglycemia 06/21/2021   Eczema 06/16/2021   Healthcare maintenance 05/12/2021   Hypertensive urgency 05/05/2021   Acute left-sided weakness 05/05/2021   Chest pain 05/05/2021   Alcohol abuse 05/05/2021   HIV (human immunodeficiency virus infection) (HCC) 05/05/2021   Tobacco use 05/05/2021   Polysubstance abuse (HCC) 05/05/2021   Paresthesia 05/05/2021   Schizophrenia (HCC) 05/05/2021   Right-sided headache 05/05/2021   Cerebral thrombosis with cerebral infarction 05/05/2021   Essential hypertension 03/20/2019   Opiate overdose (HCC) 03/19/2019   Leukocytosis 03/19/2019   Hypokalemia 03/19/2019   Left arm pain 03/19/2019   Elevated blood pressure reading 03/19/2019   Comminuted fracture of humerus, left, open, initial encounter 02/28/2019     Current Outpatient Medications on File Prior to Visit  Medication Sig Dispense Refill   bictegravir-emtricitabine -tenofovir  AF (BIKTARVY ) 50-200-25 MG TABS tablet Take 1 tablet by mouth daily. NEED APPOINTMENT FOR FUTURE REFILLS 30 tablet 0   [DISCONTINUED] gabapentin  (NEURONTIN ) 300 MG capsule Take 2 capsules (600 mg total) by mouth 2 (two) times daily. (Patient not taking: Reported on 03/28/2020) 60 capsule 1   No current facility-administered medications on file prior to visit.    Allergies  Allergen Reactions   Grass Pollen(K-O-R-T-Swt Vern) Rash    Social History   Socioeconomic History   Marital status: Single    Spouse name: Not on file   Number of children: Not on file  Years of education: Not on file   Highest education level: Not on file  Occupational History   Not on file  Tobacco Use   Smoking status: Every Day    Current packs/day: 1.00    Types: Cigarettes   Smokeless tobacco: Never  Vaping Use   Vaping status: Never Used  Substance and Sexual Activity   Alcohol use: Yes    Comment: beer   Drug use: Yes    Types: Cocaine, Marijuana    Comment: + Cocaine and +THC   Sexual  activity: Not Currently    Comment: declined condoms  Other Topics Concern   Not on file  Social History Narrative   ** Merged History Encounter **       Social Drivers of Health   Financial Resource Strain: Medium Risk (12/05/2023)   Overall Financial Resource Strain (CARDIA)    Difficulty of Paying Living Expenses: Somewhat hard  Food Insecurity: Food Insecurity Present (12/05/2023)   Hunger Vital Sign    Worried About Running Out of Food in the Last Year: Sometimes true    Ran Out of Food in the Last Year: Sometimes true  Transportation Needs: Unmet Transportation Needs (12/05/2023)   PRAPARE - Administrator, Civil Service (Medical): Yes    Lack of Transportation (Non-Medical): Yes  Physical Activity: Inactive (12/05/2023)   Exercise Vital Sign    Days of Exercise per Week: 0 days    Minutes of Exercise per Session: 0 min  Stress: Stress Concern Present (12/05/2023)   [employer] of Occupational Health - Occupational Stress Questionnaire    Feeling of Stress : Rather much  Social Connections: Socially Isolated (12/05/2023)   Social Connection and Isolation Panel [NHANES]    Frequency of Communication with Friends and Family: Twice a week    Frequency of Social Gatherings with Friends and Family: Once a week    Attends Religious Services: Never    Database Administrator or Organizations: No    Attends Banker Meetings: Never    Marital Status: Never married  Intimate Partner Violence: Not At Risk (12/05/2023)   Humiliation, Afraid, Rape, and Kick questionnaire    Fear of Current or Ex-Partner: No    Emotionally Abused: No    Physically Abused: No    Sexually Abused: No    Family History  Problem Relation Age of Onset   Hypertension Mother    Congestive Heart Failure Mother     Past Surgical History:  Procedure Laterality Date   MULTIPLE TOOTH EXTRACTIONS     ORIF HUMERUS FRACTURE Left 03/07/2019   Procedure: OPEN REDUCTION INTERNAL FIXATION  (ORIF) HUMERAL SHAFT FRACTURE;  Surgeon: Kendal Franky SQUIBB, MD;  Location: [facility] OR;  Service: Orthopedics;  Laterality: Left;    ROS: Review of Systems Negative except as stated above  PHYSICAL EXAM: BP (!) 140/60 (BP Location: Left Arm, Patient Position: Sitting, Cuff Size: Normal)   Pulse 100   Temp 97.6 F (36.4 C) (Oral)   Ht 6' 2 (1.88 m)   Wt 191 lb (86.6 kg)   SpO2 92%   BMI 24.52 kg/m   Physical Exam   General appearance - alert, older African-American male in no distress.  He appears slightly unkept.   Mental status -normal mood.  He answers questions appropriately but goes off in tangents and has to be redirected. Chest - clear to auscultation, no wheezes, rales or rhonchi, symmetric air entry Heart -regular rate rhythm with soft systolic ejection  murmur along the left sternal border. Extremities -no lower extremity edema Skin -patient has dry hyperpigmented fissuring of the skin on trunk, extremities and scalp.  Looks like eczema.  Results for orders placed or performed in visit on 12/05/23  POCT glucose (manual entry)   Collection Time: 12/05/23  4:21 PM  Result Value Ref Range   POC Glucose 127 (A) 70 - 99 mg/dl  POCT glycosylated hemoglobin (Hb A1C)   Collection Time: 12/05/23  4:49 PM  Result Value Ref Range   Hemoglobin A1C     HbA1c POC (<> result, manual entry)     HbA1c, POC (prediabetic range)     HbA1c, POC (controlled diabetic range) 5.7 0.0 - 7.0 %       12/05/2023    4:22 PM 12/01/2022    3:02 PM 12/31/2021    9:48 AM  Depression screen PHQ 2/9  Decreased Interest 1 0 0  Down, Depressed, Hopeless 2 0 0  PHQ - 2 Score 3 0 0  Altered sleeping 2    Tired, decreased energy 2    Change in appetite 2    Feeling bad or failure about yourself  1    Trouble concentrating 1    Moving slowly or fidgety/restless 0    Suicidal thoughts 0    PHQ-9 Score 11    Difficult doing work/chores Somewhat difficult         Latest Ref Rng & Units 10/19/2023     5:39 PM 10/19/2023    1:55 PM 07/10/2023    1:43 AM  CMP  Glucose 70 - 99 mg/dL  96  899   BUN 6 - 20 mg/dL  9  13   Creatinine 9.38 - 1.24 mg/dL  9.09  9.04   Sodium 864 - 145 mmol/L  137  133   Potassium 3.5 - 5.1 mmol/L  3.8  3.9   Chloride 98 - 111 mmol/L  106  99   CO2 22 - 32 mmol/L  23  25   Calcium  8.9 - 10.3 mg/dL  9.0  8.9   Total Protein 6.5 - 8.1 g/dL 7.8     Total Bilirubin <1.2 mg/dL 1.4     Alkaline Phos 38 - 126 U/L 84     AST 15 - 41 U/L 30     ALT 0 - 44 U/L 26      Lipid Panel     Component Value Date/Time   CHOL 156 05/30/2023 0855   TRIG 111 05/30/2023 0855   HDL 65 05/30/2023 0855   CHOLHDL 2.4 05/30/2023 0855   VLDL 20 05/05/2021 0330   LDLCALC 72 05/30/2023 0855    CBC    Component Value Date/Time   WBC 6.0 10/19/2023 1355   RBC 4.72 10/19/2023 1355   HGB 14.8 10/19/2023 1355   HGB 15.7 10/28/2021 1352   HCT 43.1 10/19/2023 1355   HCT 45.7 10/28/2021 1352   PLT 247 10/19/2023 1355   PLT 279 10/28/2021 1352   MCV 91.3 10/19/2023 1355   MCV 88 10/28/2021 1352   MCH 31.4 10/19/2023 1355   MCHC 34.3 10/19/2023 1355   RDW 14.0 10/19/2023 1355   RDW 12.5 10/28/2021 1352   LYMPHSABS 2.2 07/10/2023 0143   MONOABS 0.8 07/10/2023 0143   EOSABS 0.3 07/10/2023 0143   BASOSABS 0.0 07/10/2023 0143    ASSESSMENT AND PLAN: 1. Eczema, unspecified type (Primary) Looks like eczema but other possibilities is that it may be associated with his HIV.  Encouraged him to get back in with ID.  Referral submitted to dermatology.  Will prescribe some triamcinolone  ointment.  Advised not to use it on his face. - Ambulatory referral to Dermatology - triamcinolone  (KENALOG ) 0.025 % ointment; Apply 1 Application topically 2 (two) times daily. Use on affected area daily.  DO not apply to face.  Dispense: 80 g; Refill: 1  2. Essential hypertension Not at goal.  Increase amlodipine  to 10 mg daily Will need to get him back on statin therapy given his history of CVA.   However I would like to recheck his LFTs.  Last LFT showed mild elevation in bilirubin level. - amLODipine  (NORVASC ) 10 MG tablet; Take 1 tablet (10 mg total) by mouth daily.  Dispense: 90 tablet; Refill: 1 - Hepatic Function Panel; Future  3. Tobacco dependence Strongly advised to quit.  Discussed health risks associated with smoking.  4. Mild major depression (HCC) Patient denies any suicidal ideation.  He is agreeable to being plugged back in with behavioral health  5. Prediabetes Discussed healthy eating habits.  However this may not be entirely possible for him given that he is homeless - POCT glycosylated hemoglobin (Hb A1C) - POCT glucose (manual entry)  6. Schizophrenia, unspecified type (HCC) Will get him plugged back in with behavioral health.  Referral submitted  7. Homeless single person Message sent to our caseworker to see if she can assist him with getting stable housing  8. HIV infection, unspecified symptom status (HCC) Encourage patient to stop downstairs at the infectious disease clinic on the first floor of this building to schedule a follow-up appointment.  Stressed the importance of regular follow-up with his ID provider.  He tells me that he is still taking Biktarvy  but looks like the last prescription for him was written in September of last year with only 1 month refill. I also sent a message to his ID provider.  9. Encounter for screening involving social determinants of health (SDoH) - AMB Referral VBCI Care Management  10.  Polysubstance dependance, nonopioid in remission Encouraged him to remain free of street drugs.  Patient was given the opportunity to ask questions.  Patient verbalized understanding of the plan and was able to repeat key elements of the plan.   This documentation was completed using Paediatric nurse.  Any transcriptional errors are unintentional.  Orders Placed This Encounter  Procedures   Hepatic Function Panel    AMB Referral VBCI Care Management   Ambulatory referral to Dermatology   Ambulatory referral to Psychiatry   POCT glycosylated hemoglobin (Hb A1C)   POCT glucose (manual entry)     Requested Prescriptions   Signed Prescriptions Disp Refills   amLODipine  (NORVASC ) 10 MG tablet 90 tablet 1    Sig: Take 1 tablet (10 mg total) by mouth daily.   aspirin  EC 81 MG tablet 100 tablet 1    Sig: Take 1 tablet (81 mg total) by mouth daily. Swallow whole.   triamcinolone  (KENALOG ) 0.025 % ointment 80 g 1    Sig: Apply 1 Application topically 2 (two) times daily. Use on affected area daily.  DO not apply to face.    Return in about 3 months (around 03/04/2024) for Appt with [facility] in [facility] for BP check.. Give lab appt for next week.  Barnie Louder, MD, FACP

## 2023-12-06 ENCOUNTER — Other Ambulatory Visit: Payer: Self-pay

## 2023-12-11 ENCOUNTER — Ambulatory Visit: Payer: Medicaid Other | Attending: Family Medicine

## 2023-12-11 ENCOUNTER — Other Ambulatory Visit: Payer: Self-pay

## 2023-12-11 ENCOUNTER — Encounter (HOSPITAL_COMMUNITY): Payer: Self-pay | Admitting: *Deleted

## 2023-12-11 ENCOUNTER — Ambulatory Visit (HOSPITAL_COMMUNITY)
Admission: EM | Admit: 2023-12-11 | Discharge: 2023-12-11 | Disposition: A | Discharge status: Home / Self Care | Payer: Medicaid Other | Attending: Emergency Medicine | Admitting: Emergency Medicine

## 2023-12-11 DIAGNOSIS — L853 Xerosis cutis: Secondary | ICD-10-CM

## 2023-12-11 DIAGNOSIS — I10 Essential (primary) hypertension: Secondary | ICD-10-CM

## 2023-12-11 DIAGNOSIS — M7989 Other specified soft tissue disorders: Secondary | ICD-10-CM | POA: Diagnosis present

## 2023-12-11 LAB — CBC
HCT: 47 % (ref 39.0–52.0)
Hemoglobin: 15.7 g/dL (ref 13.0–17.0)
MCH: 30.5 pg (ref 26.0–34.0)
MCHC: 33.4 g/dL (ref 30.0–36.0)
MCV: 91.4 fL (ref 80.0–100.0)
Platelets: 269 10*3/uL (ref 150–400)
RBC: 5.14 MIL/uL (ref 4.22–5.81)
RDW: 13.2 % (ref 11.5–15.5)
WBC: 6.3 10*3/uL (ref 4.0–10.5)
nRBC: 0 % (ref 0.0–0.2)

## 2023-12-11 LAB — BASIC METABOLIC PANEL
Anion gap: 9 (ref 5–15)
BUN: 12 mg/dL (ref 6–20)
CO2: 26 mmol/L (ref 22–32)
Calcium: 8.9 mg/dL (ref 8.9–10.3)
Chloride: 104 mmol/L (ref 98–111)
Creatinine, Ser: 0.92 mg/dL (ref 0.61–1.24)
GFR, Estimated: >60 mL/min (ref >60)
Glucose, Bld: 91 mg/dL (ref 70–99)
Potassium: 4.4 mmol/L (ref 3.5–5.1)
Sodium: 139 mmol/L (ref 135–145)

## 2023-12-11 MED ORDER — KETOROLAC TROMETHAMINE 30 MG/ML IJ SOLN
30.0000 mg | Freq: Once | INTRAMUSCULAR | Status: AC
  Administered 2023-12-11: 30 mg via INTRAMUSCULAR

## 2023-12-11 MED ORDER — EUCERIN ORIGINAL HEALING EX CREA
TOPICAL_CREAM | CUTANEOUS | 0 refills | Status: DC | PRN
  Filled 2023-12-11: qty 454, fill #0

## 2023-12-11 MED ORDER — KETOROLAC TROMETHAMINE 30 MG/ML IJ SOLN
INTRAMUSCULAR | Status: AC
  Filled 2023-12-11: qty 1

## 2023-12-11 NOTE — ED Triage Notes (Signed)
 PT reports bil lower leg swelling with drainage.

## 2023-12-11 NOTE — ED Provider Notes (Signed)
 [facility]    CSN: [id number] Arrival date & time: 12/11/23  1213      History   Chief Complaint Chief Complaint  Patient presents with   Leg Swelling    HPI Marcus Nichols is a 58 y.o. male.   Patient presents with bilateral leg swelling and dryness. Patient states that he recently saw his PCP who increased his amlodipine  from 5mg  to 10mg  and prescribed triamcinolone  cream.      Past Medical History:  Diagnosis Date   Asthma    Eczema    Enlarged prostate    HIV (human immunodeficiency virus infection) (HCC)    Humerus fracture    left    Inguinal hernia    Schizophrenia (HCC)    Sleep apnea    does not wear CPAP ( per spouse )    Patient Active Problem List   Diagnosis Date Noted   Prediabetes 12/15/2021   History of CVA with residual deficit 06/21/2021   Hyperglycemia 06/21/2021   Eczema 06/16/2021   Healthcare maintenance 05/12/2021   Hypertensive urgency 05/05/2021   Acute left-sided weakness 05/05/2021   Chest pain 05/05/2021   Alcohol abuse 05/05/2021   HIV (human immunodeficiency virus infection) (HCC) 05/05/2021   Tobacco use 05/05/2021   Polysubstance abuse (HCC) 05/05/2021   Paresthesia 05/05/2021   Schizophrenia (HCC) 05/05/2021   Right-sided headache 05/05/2021   Cerebral thrombosis with cerebral infarction 05/05/2021   Essential hypertension 03/20/2019   Opiate overdose (HCC) 03/19/2019   Leukocytosis 03/19/2019   Hypokalemia 03/19/2019   Left arm pain 03/19/2019   Elevated blood pressure reading 03/19/2019   Comminuted fracture of humerus, left, open, initial encounter 02/28/2019    Past Surgical History:  Procedure Laterality Date   MULTIPLE TOOTH EXTRACTIONS     ORIF HUMERUS FRACTURE Left 03/07/2019   Procedure: OPEN REDUCTION INTERNAL FIXATION (ORIF) HUMERAL SHAFT FRACTURE;  Surgeon: Kendal Franky SQUIBB, MD;  Location: [facility] OR;  Service: Orthopedics;  Laterality: Left;       Home Medications    Prior to Admission  medications   Medication Sig Start Date End Date Taking? Authorizing Provider  Skin Protectants, Misc. (EUCERIN) cream Apply topically as needed for dry skin. 12/11/23  Yes Johnie, Seira Cody A, NP  amLODipine  (NORVASC ) 10 MG tablet Take 1 tablet (10 mg total) by mouth daily. 12/05/23   Vicci Barnie NOVAK, MD  aspirin  EC 81 MG tablet Take 1 tablet (81 mg total) by mouth daily. Swallow whole. 12/05/23   Vicci Barnie NOVAK, MD  bictegravir-emtricitabine -tenofovir  AF (BIKTARVY ) 50-200-25 MG TABS tablet Take 1 tablet by mouth daily. NEED APPOINTMENT FOR FUTURE REFILLS 08/16/23   Calone, Gregory D, FNP  triamcinolone  (KENALOG ) 0.025 % ointment Apply 1 Application topically 2 (two) times daily. Use on affected area daily.  DO not apply to face. 12/05/23   Vicci Barnie NOVAK, MD  gabapentin  (NEURONTIN ) 300 MG capsule Take 2 capsules (600 mg total) by mouth 2 (two) times daily. Patient not taking: Reported on 03/28/2020 03/20/19 12/07/20  Davia Nydia POUR, MD    Family History Family History  Problem Relation Age of Onset   Hypertension Mother    Congestive Heart Failure Mother     Social History Social History   Tobacco Use   Smoking status: Every Day    Current packs/day: 1.00    Types: Cigarettes   Smokeless tobacco: Never  Vaping Use   Vaping status: Never Used  Substance Use Topics   Alcohol use: Yes    Comment:  beer   Drug use: Yes    Types: Cocaine, Marijuana    Comment: + Cocaine and +THC     Allergies   Grass pollen(k-o-r-t-swt vern)   Review of Systems Review of Systems  Cardiovascular:  Positive for leg swelling. Negative for chest pain and palpitations.     Physical Exam Triage Vital Signs ED Triage Vitals  Encounter Vitals Group     BP 12/11/23 1357 (!) 160/84     Systolic BP Percentile --      Diastolic BP Percentile --      Pulse Rate 12/11/23 1357 86     Resp 12/11/23 1357 20     Temp 12/11/23 1357 (!) 97.5 F (36.4 C)     Temp src --      SpO2 12/11/23 1357 98 %      Weight --      Height --      Head Circumference --      Peak Flow --      Pain Score 12/11/23 1354 10     Pain Loc --      Pain Education --      Exclude from Growth Chart --    No data found.  Updated Vital Signs BP (!) 160/84   Pulse 86   Temp (!) 97.5 F (36.4 C)   Resp 20   SpO2 98%   Visual Acuity Right Eye Distance:   Left Eye Distance:   Bilateral Distance:    Right Eye Near:   Left Eye Near:    Bilateral Near:     Physical Exam Vitals and nursing note reviewed.  Constitutional:      General: He is awake. He is not in acute distress.    Appearance: Normal appearance. He is well-developed and well-groomed. He is not ill-appearing.  Musculoskeletal:     Right lower leg: 1+ Edema present.     Left lower leg: 2+ Edema present.  Skin:    General: Skin is warm and dry.     Findings: Erythema present.     Comments: Skin of bilateral lower legs is erythematous severely dry.  Neurological:     Mental Status: He is alert.  Psychiatric:        Behavior: Behavior is cooperative.      UC Treatments / Results  Labs (all labs ordered are listed, but only abnormal results are displayed) Labs Reviewed  CBC  BASIC METABOLIC PANEL    EKG   Radiology No results found.  Procedures Procedures (including critical care time)  Medications Ordered in UC Medications  ketorolac  (TORADOL ) 30 MG/ML injection 30 mg (30 mg Intramuscular Given 12/11/23 1512)    Initial Impression / Assessment and Plan / UC Course  I have reviewed the triage vital signs and the nursing notes.  Pertinent labs & imaging results that were available during my care of the patient were reviewed by me and considered in my medical decision making (see chart for details).     Patient presented with bilateral leg swelling and dryness. Patient recently increased his blood pressure medications and was prescribed triamcinolone  cream. Patient states he just picked up the triamcinolone  cream  today and was applying it when I entered the room.  Patient was persistent about wanting blood work done today.  Basic labs ordered.  Patient was also persistent about getting something for the pain in his legs. Was given Toradol  injection in clinic.  Prescribed Eucerin cream and recommended combining it with triamcinolone   for his dry skin on his legs.  Recommended following up with primary care doctor.  Discussed return, emergency department precautions. Final Clinical Impressions(s) / UC Diagnoses   Final diagnoses:  Leg swelling  Dry skin     Discharge Instructions      I have prescribed Eucerin cream and I would recommend combining this with the triamcinolone  and rubbing it on your legs.  We collected basic labs today and someone will call if anything is abnormal.  Recommend following up with your primary care doctor regarding chronic issues.  Return here as needed.  If you develop severe shortness of breath, chest pain, worsening leg swelling please seek immediate medical treatment in the ER.    ED Prescriptions     Medication Sig Dispense Auth. Provider   Skin Protectants, Misc. (EUCERIN) cream Apply topically as needed for dry skin. 454 g Johnie Flaming A, NP      PDMP not reviewed this encounter.   Johnie Flaming A, NP 12/11/23 1739

## 2023-12-11 NOTE — Discharge Instructions (Signed)
 I have prescribed Eucerin cream and I would recommend combining this with the triamcinolone  and rubbing it on your legs.  We collected basic labs today and someone will call if anything is abnormal.  Recommend following up with your primary care doctor regarding chronic issues.  Return here as needed.  If you develop severe shortness of breath, chest pain, worsening leg swelling please seek immediate medical treatment in the ER.

## 2023-12-12 ENCOUNTER — Other Ambulatory Visit: Payer: Self-pay | Admitting: Internal Medicine

## 2023-12-12 ENCOUNTER — Other Ambulatory Visit: Payer: Self-pay

## 2023-12-12 LAB — HEPATIC FUNCTION PANEL
ALT: 33 [IU]/L (ref 0–44)
AST: 43 [IU]/L — ABNORMAL HIGH (ref 0–40)
Albumin: 3.9 g/dL (ref 3.8–4.9)
Alkaline Phosphatase: 83 [IU]/L (ref 44–121)
Bilirubin Total: 0.5 mg/dL (ref 0.0–1.2)
Bilirubin, Direct: 0.2 mg/dL (ref 0.00–0.40)
Total Protein: 7.4 g/dL (ref 6.0–8.5)

## 2023-12-12 MED ORDER — ATORVASTATIN CALCIUM 20 MG PO TABS
20.0000 mg | ORAL_TABLET | Freq: Every day | ORAL | 0 refills | Status: DC
  Filled 2023-12-12: qty 30, 30d supply, fill #0
  Filled 2024-01-08: qty 30, 30d supply, fill #1
  Filled 2024-02-05: qty 30, 30d supply, fill #2

## 2023-12-13 ENCOUNTER — Other Ambulatory Visit: Payer: Medicaid Other

## 2023-12-15 ENCOUNTER — Other Ambulatory Visit: Payer: Self-pay | Admitting: Family

## 2023-12-15 DIAGNOSIS — B20 Human immunodeficiency virus [HIV] disease: Secondary | ICD-10-CM

## 2023-12-19 ENCOUNTER — Other Ambulatory Visit: Payer: Self-pay

## 2023-12-20 ENCOUNTER — Other Ambulatory Visit: Payer: Self-pay

## 2023-12-20 ENCOUNTER — Ambulatory Visit: Payer: Medicaid Other | Admitting: Pharmacist

## 2023-12-20 NOTE — Patient Outreach (Signed)
Medicaid Managed Care Social Work Note  12/20/2023 Name:  Marcus Nichols MRN:  [id number] DOB:  11-Sep-1966  Marcus Nichols is an 58 y.o. year old male who is a primary patient of Marcus Matar, MD.  The Medicaid Managed Care Coordination team was consulted for assistance with:  Community Resources  housing  Marcus Nichols was given information about Medicaid Managed Care Coordination team services today. Marcus Nichols Patient agreed to services and verbal consent obtained.  Engaged with patient  for by telephone forinitial visit in response to referral for case management and/or care coordination services.   Patient is participating in a Managed Medicaid Plan:  Yes  Assessments/Interventions:  Review of past medical history, allergies, medications, Nichols status, including review of consultants reports, laboratory and other test data, was performed as part of comprehensive evaluation and provision of chronic care management services.  SDOH: (Social Drivers of Nichols) assessments and interventions performed: SDOH Interventions    Flowsheet Row Office Visit from 12/05/2023 in [facility]  SDOH Interventions   Food Insecurity Interventions Other (Comment)  [Green book]  Housing Interventions AMB Referral  Transportation Interventions AMB Referral  Utilities Interventions AMB Referral  Alcohol Usage Interventions Intervention Not Indicated (Score <7)  Depression Interventions/Treatment  Referral to Psychiatry  Financial Strain Interventions Intervention Not Indicated  Physical Activity Interventions [employer]  Stress Interventions Intervention Not Indicated  Social Connections Interventions Intervention Not Indicated  Nichols Literacy Interventions Intervention Not Indicated      Marcus Nichols completed a telephone outreach with patient, he states he is homeless and stays with friends. Patient states he does not have any income has  applied for disability. Patient states he would like to work but due to being on some medications he [place] unable too. Marcus Nichols and patient agreed for housing resources to be mailed to him. Patient does receive foodstamps.  Advanced Directives Status:  Not addressed in this encounter.  Care Plan                 Allergies  Allergen Reactions   Grass Pollen(K-O-R-T-Swt Vern) Rash    Medications Reviewed Today   Medications were not reviewed in this encounter     Patient Active Problem List   Diagnosis Date Noted   Prediabetes 12/15/2021   History of CVA with residual deficit 06/21/2021   Hyperglycemia 06/21/2021   Eczema 06/16/2021   Healthcare maintenance 05/12/2021   Hypertensive urgency 05/05/2021   Acute left-sided weakness 05/05/2021   Chest pain 05/05/2021   Alcohol abuse 05/05/2021   HIV (human immunodeficiency virus infection) (HCC) 05/05/2021   Tobacco use 05/05/2021   Polysubstance abuse (HCC) 05/05/2021   Paresthesia 05/05/2021   Schizophrenia (HCC) 05/05/2021   Right-sided headache 05/05/2021   Cerebral thrombosis with cerebral infarction 05/05/2021   Essential hypertension 03/20/2019   Opiate overdose (HCC) 03/19/2019   Leukocytosis 03/19/2019   Hypokalemia 03/19/2019   Left arm pain 03/19/2019   Elevated blood pressure reading 03/19/2019   Comminuted fracture of humerus, left, open, initial encounter 02/28/2019    Conditions to be addressed/monitored per PCP order:   housing and job boards  There are no care plans that you recently modified to display for this patient.   Follow up:  Patient agrees to Care Plan and Follow-up.  Plan: The Managed Medicaid care management team will reach out to the patient again over the next 30 days.  Date/time of next scheduled Social Work care management/care  coordination outreach:  01/22/24  Marcus Nichols, Marcus Nichols, Marcus Nichols  Managed [facility] Social Worker [phone]

## 2023-12-20 NOTE — Patient Instructions (Signed)
Visit Information  Mr. Lovgren was given information about Medicaid Managed Care team care coordination services as a part of their [facility] Community Plan Medicaid benefit. [name] verbally consented to engagement with the [facility] Managed Care team.   If you are experiencing a medical emergency, please call 911 or report to your local emergency department or urgent care.   If you have a non-emergency medical problem during routine business hours, please contact your provider's office and ask to speak with a nurse.   For questions related to your [facility], please call: [phone] or visit the homepage here: [email or web address]  If you would like to schedule transportation through your [facility], please call the following number at least 2 days in advance of your appointment: [phone]   Rides for urgent appointments can also be made after hours by calling Member Services.  Call the Behavioral Health Crisis Line at [phone], at any time, 24 hours a day, 7 days a week. If you are in danger or need immediate medical attention call 911.  If you would like help to quit smoking, call 1-800-QUIT-NOW (6366727525) OR Espaol: 1-855-Djelo-Ya (7-253-664-4034) o para ms informacin haga clic aqu or Text READY to 742-595 to register via text  Mr. Ovitt - following are the goals we discussed in your visit today:   Goals Addressed   None     Social Worker will follow up in 30 days.   Gus Puma, Kenard Gower, MHA [facility]  Managed Medicaid Social Worker [phone]   Following is a copy of your plan of care:  There are no care plans that you recently modified to display for this patient.

## 2023-12-21 ENCOUNTER — Other Ambulatory Visit (HOSPITAL_COMMUNITY)
Admission: RE | Admit: 2023-12-21 | Discharge: 2023-12-21 | Disposition: A | Discharge status: Home / Self Care | Payer: Medicaid Other | Source: Ambulatory Visit | Attending: Internal Medicine | Admitting: Internal Medicine

## 2023-12-21 ENCOUNTER — Ambulatory Visit (INDEPENDENT_AMBULATORY_CARE_PROVIDER_SITE_OTHER): Payer: Medicaid Other | Admitting: Internal Medicine

## 2023-12-21 ENCOUNTER — Other Ambulatory Visit: Payer: Self-pay

## 2023-12-21 ENCOUNTER — Telehealth: Payer: Self-pay

## 2023-12-21 ENCOUNTER — Encounter: Payer: Self-pay | Admitting: Internal Medicine

## 2023-12-21 VITALS — BP 146/77 | HR 74 | Temp 99.0°F | Resp 16 | Wt 182.2 lb

## 2023-12-21 DIAGNOSIS — F17210 Nicotine dependence, cigarettes, uncomplicated: Secondary | ICD-10-CM | POA: Diagnosis not present

## 2023-12-21 DIAGNOSIS — R21 Rash and other nonspecific skin eruption: Secondary | ICD-10-CM | POA: Diagnosis not present

## 2023-12-21 DIAGNOSIS — I10 Essential (primary) hypertension: Secondary | ICD-10-CM

## 2023-12-21 DIAGNOSIS — Z113 Encounter for screening for infections with a predominantly sexual mode of transmission: Secondary | ICD-10-CM

## 2023-12-21 DIAGNOSIS — B20 Human immunodeficiency virus [HIV] disease: Secondary | ICD-10-CM

## 2023-12-21 DIAGNOSIS — G629 Polyneuropathy, unspecified: Secondary | ICD-10-CM

## 2023-12-21 MED ORDER — DOLUTEGRAVIR-LAMIVUDINE 50-300 MG PO TABS: 1.0000 | ORAL_TABLET | Freq: Every day | ORAL | 11 refills | Status: DC

## 2023-12-21 NOTE — Addendum Note (Signed)
Addended by: Harley Alto on: 12/21/2023 01:33 PM   Modules accepted: Orders

## 2023-12-21 NOTE — Telephone Encounter (Signed)
Saw Marcus Nichols after he voiced interest on potentially starting Cabenuva. Counseled that [place] is two separate intramuscular injections in the gluteal muscle on each side for each visit. Explained that the second injection is 30 days after the initial injection then every 2 months thereafter. Discussed that if he regularly miss his Cabenuva doses that he is going to be at a high risk of developing HIV resistance, preventing him from using most of current available HIV ART. Explained that showing up to injection appointments is very important and warned that if 2 appointments are missed, it will be reassessed by their provider whether they are a good candidate for injection therapy. Counseled on possible side effects associated with the injections such as injection site pain, which is usually mild to moderate in nature, injection site nodules, and injection site reactions. Asked to call the clinic or send [place] a mychart message if they experience any issues, such as fatigue, nausea, headache, rash, or dizziness. Advised that they can take ibuprofen or tylenol for injection site pain if needed. Made patient sign a copy of [facility]'s Cabenuva Contract to ensure that he understands the important of adherence and not missing follow up appointments while on Cabenuva. Informed him that we will first need to conduct HIV genotyping for him prior to starting him on Cabenuva to ensure that cabotegravir/rilpivirine is appropriate for him, especially given his history with Sustiva, combivir, Genvoya, and Biktravy (current). Patient agrees to HIV genotyping today. Will contact him once genotyping results return.

## 2023-12-21 NOTE — Addendum Note (Signed)
Addended by: Harley Alto on: 12/21/2023 01:29 PM   Modules accepted: Orders

## 2023-12-21 NOTE — Progress Notes (Signed)
RFV : follow up for hiv disease Patient ID: Marcus Nichols, male   DOB: 12/18/1965, 58 y.o.   MRN: [id number]  HPI Mr. Kirtz is a 58 y/o AA male diagnosed with HIV disease in 1999 with risk factor of drug use and heterosexual contact. No history of opportunistic infection. Genotype on 05/12/21 with Subtype B and no significant resistance patterns. Initial CD4 and viral load are unavailable.Previous ART experience with Sustiva, combivir, Genvoya and now [employer].  has been off of biktarvy for the last month but concern that it is causing skin eruptions/rash.  last seen in jan 2024 here for annual exam.  CD 4 count of 421/VL28 in July 2024. He presents to clinic for hiv follow up.   He is interested in cabenuva injection; he states he takes all his medicines daily with exception to recent rash. He states he was only on biktarvy at the time. But he states that the rash included both upper extremities nad legs, and torso. Pruritic but also weeping. Now healed  Outpatient Encounter Medications as of 12/21/2023  Medication Sig   amLODipine (NORVASC) 10 MG tablet Take 1 tablet (10 mg total) by mouth daily.   aspirin EC 81 MG tablet Take 1 tablet (81 mg total) by mouth daily. Swallow whole.   atorvastatin (LIPITOR) 20 MG tablet Take 1 tablet (20 mg total) by mouth daily.   BIKTARVY 50-200-25 MG TABS tablet TAKE 1 TABLET BY MOUTH DAILY   Skin Protectants, Misc. (EUCERIN) cream Apply topically as needed for dry skin.   triamcinolone (KENALOG) 0.025 % ointment Apply 1 Application topically 2 (two) times daily. Use on affected area daily.  DO not apply to Nichols.   [DISCONTINUED] gabapentin (NEURONTIN) 300 MG capsule Take 2 capsules (600 mg total) by mouth 2 (two) times daily. (Patient not taking: Reported on 03/28/2020)   No facility-administered encounter medications on file as of 12/21/2023.     Patient Active Problem List   Diagnosis Date Noted   Prediabetes 12/15/2021   History of CVA with residual  deficit 06/21/2021   Hyperglycemia 06/21/2021   Eczema 06/16/2021   Healthcare maintenance 05/12/2021   Hypertensive urgency 05/05/2021   Acute left-sided weakness 05/05/2021   Chest pain 05/05/2021   Alcohol abuse 05/05/2021   HIV (human immunodeficiency virus infection) (HCC) 05/05/2021   Tobacco use 05/05/2021   Polysubstance abuse (HCC) 05/05/2021   Paresthesia 05/05/2021   Schizophrenia (HCC) 05/05/2021   Right-sided headache 05/05/2021   Cerebral thrombosis with cerebral infarction 05/05/2021   Essential hypertension 03/20/2019   Opiate overdose (HCC) 03/19/2019   Leukocytosis 03/19/2019   Hypokalemia 03/19/2019   Left arm pain 03/19/2019   Elevated blood pressure reading 03/19/2019   Comminuted fracture of humerus, left, open, initial encounter 02/28/2019     Health Maintenance Due  Topic Date Due   Zoster Vaccines- Shingrix (1 of 2) Never done   Colonoscopy  Never done   COVID-19 Vaccine (2 - Pfizer risk series) 12/22/2022   INFLUENZA VACCINE  06/29/2023     Review of Systems  Physical Exam   There were no vitals taken for this visit.   Lab Results  Component Value Date   CD4TCELL 22 (L) 05/30/2023   Lab Results  Component Value Date   CD4TABS 421 05/30/2023   CD4TABS 387 (L) 11/17/2022   CD4TABS 592 12/15/2021   Lab Results  Component Value Date   HIV1RNAQUANT 28 (H) 05/30/2023   No results found for: "HEPBSAB" Lab Results  Component Value Date  LABRPR NON-REACTIVE 05/30/2023    CBC Lab Results  Component Value Date   WBC 6.3 12/11/2023   RBC 5.14 12/11/2023   HGB 15.7 12/11/2023   HCT 47.0 12/11/2023   PLT 269 12/11/2023   MCV 91.4 12/11/2023   MCH 30.5 12/11/2023   MCHC 33.4 12/11/2023   RDW 13.2 12/11/2023   LYMPHSABS 2.2 07/10/2023   MONOABS 0.8 07/10/2023   EOSABS 0.3 07/10/2023    BMET Lab Results  Component Value Date   NA 139 12/11/2023   K 4.4 12/11/2023   CL 104 12/11/2023   CO2 26 12/11/2023   GLUCOSE 91  12/11/2023   BUN 12 12/11/2023   CREATININE 0.92 12/11/2023   CALCIUM 8.9 12/11/2023   GFRNONAA >60 12/11/2023   GFRAA 54 (L) 03/28/2020      Assessment and Plan   Hiv disease= will check labs - Wants to convert to injectionable however concerned about his reliability to come to clinic every 2 months. We will have pharmacy meet with him. Will do archive genotype. As well as standard labs. In the meantime, difficult to tell if drug rash to biktarvy. We will switch to dovato to see if it was related to TAF/EMB. If he can take reliably and VL undetectable then consider getting on cabenuva. Still concern that he can't keep appt.  Long term medication management = will check cr  Neuropathy = at next visit will consider starting gabapentin  Rash = appears resolving. Will still check rpr to see if could be secondary syphilis  Hypertension = slightly above goal. Will recheck at next visit to see if meds need to be adjusted

## 2023-12-22 LAB — CBC WITH DIFFERENTIAL/PLATELET
Absolute Lymphocytes: 2374 {cells}/uL (ref 850–3900)
Absolute Monocytes: 451 {cells}/uL (ref 200–950)
Basophils Absolute: 32 {cells}/uL (ref 0–200)
Basophils Relative: 0.6 %
Eosinophils Absolute: 440 {cells}/uL (ref 15–500)
Eosinophils Relative: 8.3 %
HCT: 46.9 % (ref 38.5–50.0)
Hemoglobin: 15.4 g/dL (ref 13.2–17.1)
MCH: 30.3 pg (ref 27.0–33.0)
MCHC: 32.8 g/dL (ref 32.0–36.0)
MCV: 92.3 fL (ref 80.0–100.0)
MPV: 11 fL (ref 7.5–12.5)
Monocytes Relative: 8.5 %
Neutro Abs: 2003 {cells}/uL (ref 1500–7800)
Neutrophils Relative %: 37.8 %
Platelets: 263 10*3/uL (ref 140–400)
RBC: 5.08 10*6/uL (ref 4.20–5.80)
RDW: 12.4 % (ref 11.0–15.0)
Total Lymphocyte: 44.8 %
WBC: 5.3 10*3/uL (ref 3.8–10.8)

## 2023-12-22 LAB — COMPLETE METABOLIC PANEL WITH GFR
AG Ratio: 1 (calc) (ref 1.0–2.5)
ALT: 36 U/L (ref 9–46)
AST: 48 U/L — ABNORMAL HIGH (ref 10–35)
Albumin: 4.3 g/dL (ref 3.6–5.1)
Alkaline phosphatase (APISO): 70 U/L (ref 35–144)
BUN: 14 mg/dL (ref 7–25)
CO2: 27 mmol/L (ref 20–32)
Calcium: 9.6 mg/dL (ref 8.6–10.3)
Chloride: 102 mmol/L (ref 98–110)
Creat: 0.92 mg/dL (ref 0.70–1.30)
Globulin: 4.2 g/dL — ABNORMAL HIGH (ref 1.9–3.7)
Glucose, Bld: 92 mg/dL (ref 65–99)
Potassium: 4.4 mmol/L (ref 3.5–5.3)
Sodium: 139 mmol/L (ref 135–146)
Total Bilirubin: 0.8 mg/dL (ref 0.2–1.2)
Total Protein: 8.5 g/dL — ABNORMAL HIGH (ref 6.1–8.1)
eGFR: 96 mL/min/{1.73_m2} (ref >=60)

## 2023-12-22 LAB — RPR: RPR Ser Ql: NONREACTIVE

## 2023-12-22 LAB — LIPID PANEL
Cholesterol: 118 mg/dL (ref <200)
HDL: 43 mg/dL (ref >=40)
LDL Cholesterol (Calc): 49 mg/dL
Non-HDL Cholesterol (Calc): 75 mg/dL (ref <130)
Total CHOL/HDL Ratio: 2.7 (calc) (ref <5.0)
Triglycerides: 191 mg/dL — ABNORMAL HIGH (ref <150)

## 2023-12-22 LAB — URINE CYTOLOGY ANCILLARY ONLY
Chlamydia: NEGATIVE
Comment: NEGATIVE
Comment: NORMAL
Neisseria Gonorrhea: NEGATIVE

## 2023-12-22 LAB — T-HELPER CELL (CD4) - (RCID CLINIC ONLY)
CD4 % Helper T Cell: 19 % — ABNORMAL LOW (ref 33–65)
CD4 T Cell Abs: 424 /uL (ref 400–1790)

## 2023-12-26 ENCOUNTER — Ambulatory Visit (HOSPITAL_COMMUNITY): Payer: Medicaid Other | Admitting: Mental Health

## 2023-12-26 ENCOUNTER — Telehealth: Payer: Self-pay

## 2023-12-26 NOTE — Telephone Encounter (Signed)
Received call from [facility] with [facility] stating they were unable to process Genosure test. They received plasma instead of whole blood.   Marcus Nichols can be reached at [phone].  Sandie Ano, RN

## 2023-12-26 NOTE — Telephone Encounter (Signed)
Archive was not able to run from 1/23 - we could try again when he sees Tammy Sours next month.

## 2023-12-28 ENCOUNTER — Other Ambulatory Visit: Payer: Self-pay

## 2023-12-28 ENCOUNTER — Other Ambulatory Visit: Payer: Medicaid Other

## 2023-12-28 MED ORDER — DOLUTEGRAVIR-LAMIVUDINE 50-300 MG PO TABS: 1.0000 | ORAL_TABLET | Freq: Every day | ORAL | 11 refills | Status: DC

## 2023-12-29 ENCOUNTER — Other Ambulatory Visit: Payer: Self-pay | Admitting: Pharmacist

## 2023-12-29 LAB — HIV-1 INTEGRASE GENOTYPE

## 2023-12-29 LAB — HIV RNA, RTPCR W/R GT (RTI, PI,INT)
HIV 1 RNA Quant: 43900 {copies}/mL — ABNORMAL HIGH
HIV-1 RNA Quant, Log: 4.64 {Log} — ABNORMAL HIGH

## 2023-12-29 LAB — HIV-1 GENOTYPE: HIV-1 Genotype: DETECTED — AB

## 2023-12-29 MED ORDER — DOVATO 50-300 MG PO TABS: 1.0000 | ORAL_TABLET | Freq: Every day | ORAL | Status: AC

## 2023-12-29 NOTE — Progress Notes (Signed)
 Medication Samples have been provided to the patient.  Drug name: Dovato        Strength: 50/300 mg         Qty: 14  Tablets (1 bottles) LOT: [id number]   Exp.Date: 7/26  Dosing instructions: Take one tablet by mouth once daily  The patient has been instructed regarding the correct time, dose, and frequency of taking this medication, including desired effects and most common side effects.   Margarite Gouge, PharmD, CPP, BCIDP, AAHIVP Clinical Pharmacist Practitioner Infectious Diseases Clinical Pharmacist [facility]

## 2024-01-04 ENCOUNTER — Encounter (HOSPITAL_COMMUNITY): Payer: Self-pay | Admitting: Student

## 2024-01-04 ENCOUNTER — Ambulatory Visit (INDEPENDENT_AMBULATORY_CARE_PROVIDER_SITE_OTHER): Payer: Self-pay | Admitting: Student

## 2024-01-04 VITALS — BP 154/78 | HR 78 | Ht 74.0 in | Wt 184.6 lb

## 2024-01-04 DIAGNOSIS — Z59819 Housing instability, housed unspecified: Secondary | ICD-10-CM | POA: Diagnosis not present

## 2024-01-04 DIAGNOSIS — F4310 Post-traumatic stress disorder, unspecified: Secondary | ICD-10-CM

## 2024-01-04 DIAGNOSIS — F29 Unspecified psychosis not due to a substance or known physiological condition: Secondary | ICD-10-CM | POA: Diagnosis not present

## 2024-01-04 DIAGNOSIS — F1491 Cocaine use, unspecified, in remission: Secondary | ICD-10-CM

## 2024-01-04 DIAGNOSIS — F331 Major depressive disorder, recurrent, moderate: Secondary | ICD-10-CM

## 2024-01-04 DIAGNOSIS — F17200 Nicotine dependence, unspecified, uncomplicated: Secondary | ICD-10-CM

## 2024-01-04 DIAGNOSIS — F1011 Alcohol abuse, in remission: Secondary | ICD-10-CM

## 2024-01-04 MED ORDER — RISPERIDONE 0.5 MG PO TABS: 0.5000 mg | ORAL_TABLET | Freq: Every day | ORAL | 1 refills | Status: AC

## 2024-01-04 NOTE — Patient Instructions (Signed)
 [facility]  URGENT CARE: Open 24 hours per day for acute and/or urgent behavioral health concerns.   OUTPATIENT Walk-in information:  Please note, all walk-ins are first come & first serve, with limited number of availability. Therapist for therapy:  Monday, Tuesday, Wednesday & Thursday mornings Please ARRIVE at 7:00 AM for registration Will START at 8:00 AM Every 1st, 2nd & 3rd Friday of the month: Please ARRIVE at 7:00 AM for registration Will START at 1 PM - 5 PM Psychiatrist for medication management: Monday - Friday:  Please ARRIVE at 7:00 AM for registration Will START at 8:00 AM Appointments times are as follows:  - New patients get 1 hr ex: 8-9 am 9-10 & 10-11 then that's it.  - Existing pt's that are not seeing their provider will still take 1hr as they would be new to the provider that is covering walk ins that day.  - Existing follow ups get 30 mins.... (if they have been seen by the provider covering walk ins that day.)      ** Thursdays with Bernice Rao, [facility], for therapy.    Regretfully, due to limited availability, please be aware that you may not been seen on the same day as walk-in. Please consider making an appoint or try again. Thank you for your patience and understanding.  [facility] [address] [place]  [place], [place] [address] [phone]  New patients are seen at their walk-in clinic. Walk-in hours are Monday - Friday from 8:30 am - 12:00 pm, and from 1:00 pm - 2:30 pm.   Walk-in patients are seen on a first come, first served basis, so try to arrive as early as possible for the best chance of being seen the same day.
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
## 2024-01-05 ENCOUNTER — Encounter: Payer: Self-pay | Admitting: Pharmacist

## 2024-01-05 ENCOUNTER — Ambulatory Visit: Payer: Medicaid Other | Attending: Family Medicine | Admitting: Pharmacist

## 2024-01-05 VITALS — BP 135/70

## 2024-01-05 DIAGNOSIS — I10 Essential (primary) hypertension: Secondary | ICD-10-CM

## 2024-01-05 DIAGNOSIS — F331 Major depressive disorder, recurrent, moderate: Secondary | ICD-10-CM | POA: Diagnosis not present

## 2024-01-05 NOTE — Progress Notes (Signed)
   S:    PCP: Dr. Vicci   Patient arrives in good spirits. Presents to the clinic for hypertension evaluation, counseling, and management. Patient was referred and last seen by Primary Care Provider, Dr. Vicci, on 12/05/2023. Amlodipine  dose was increased due to clinic BP being 140/60. Of note, he was seen by Dr. Fermin yesterday (01/04/2024 at [facility]. She requested that I get some labs for him today as well.   Today, pt reports compliance with his antihypertensives. Denies any side effects.  Current BP Medications include:   -Amlodipine  10 mg daily  Antihypertensives tried in the past include:  -hydrochlorothiazide   -losartan   Dietary habits include: tries to comply with sodium restriction. Denies excessive intake of caffeine.  Exercise habits include: none  Family / Social history:  -Mother: HTN, CHF -Current smoker, attempting to quit. -Denies other illicit substance use  ASCVD risk factors include: HTN, stroke, smoking  O:  Vitals:   01/05/24 1358  BP: 135/70    Home BP readings: none. Patient reported willingness to begin measuring blood pressure at home.  Last 3 Office BP readings: BP Readings from Last 3 Encounters:  01/05/24 135/70  01/04/24 (!) 154/78  12/21/23 (!) 146/77    BMET    Component Value Date/Time   NA 139 12/21/2023 1335   NA 142 12/14/2021 1439   K 4.4 12/21/2023 1335   CL 102 12/21/2023 1335   CO2 27 12/21/2023 1335   GLUCOSE 92 12/21/2023 1335   BUN 14 12/21/2023 1335   BUN 11 12/14/2021 1439   CREATININE 0.92 12/21/2023 1335   CALCIUM  9.6 12/21/2023 1335   GFRNONAA >60 12/11/2023 1454   GFRAA 54 (L) 03/28/2020 1855    Renal function: Estimated Creatinine Clearance: 101.8 mL/min (by C-G formula based on SCr of 0.92 mg/dL).  Clinical ASCVD: Yes  The ASCVD Risk score (Arnett DK, et al., 2019) failed to calculate for the following reasons:   Risk score cannot be calculated because patient has a medical  history suggesting prior/existing ASCVD   A/P: Hypertension longstanding, currently close to goal on current medications. BP Goal = < 130/80 mmHg. Medication adherence reported.  -Continue amlodipine  10 mg daily -Will have him return in 1 month for BP follow-up.  -Counseled on lifestyle modifications for blood pressure control including reduced dietary sodium, increased exercise, adequate sleep. -Vit D, 25-OH level  taken; cc'd Dr. Fermin of results  -TSH w/ thyroid  panel talen; cc'd Dr. Fermin of results  -Unable to get UDS per my protocol.   Total time in face-to-face counseling 30 minutes. F/U Clinic Visit with me 1 month.  Herlene Fleeta Morris, PharmD, JAQUELINE, CPP Clinical Pharmacist [facility] [phone]

## 2024-01-06 ENCOUNTER — Other Ambulatory Visit: Payer: Self-pay

## 2024-01-06 ENCOUNTER — Other Ambulatory Visit: Payer: Self-pay | Admitting: Internal Medicine

## 2024-01-06 LAB — VITAMIN D 25 HYDROXY (VIT D DEFICIENCY, FRACTURES): Vit D, 25-Hydroxy: 7.4 ng/mL — ABNORMAL LOW (ref 30.0–100.0)

## 2024-01-06 LAB — THYROID PANEL WITH TSH
Free Thyroxine Index: 2 (ref 1.2–4.9)
T3 Uptake Ratio: 32 % (ref 24–39)
T4, Total: 6.2 ug/dL (ref 4.5–12.0)
TSH: 0.518 u[IU]/mL (ref 0.450–4.500)

## 2024-01-06 MED ORDER — VITAMIN D (ERGOCALCIFEROL) 1.25 MG (50000 UNIT) PO CAPS
50000.0000 [IU] | ORAL_CAPSULE | ORAL | 1 refills | Status: AC
  Filled 2024-01-06 – 2024-01-08 (×2): qty 4, 28d supply, fill #0
  Filled 2024-02-05: qty 4, 28d supply, fill #1
  Filled 2024-03-14: qty 4, 28d supply, fill #2
  Filled 2024-04-12: qty 4, 28d supply, fill #3
  Filled 2024-06-06: qty 4, 28d supply, fill #4

## 2024-01-07 ENCOUNTER — Other Ambulatory Visit: Payer: Self-pay

## 2024-01-08 ENCOUNTER — Other Ambulatory Visit: Payer: Self-pay

## 2024-01-08 ENCOUNTER — Telehealth: Payer: Self-pay

## 2024-01-08 NOTE — Telephone Encounter (Signed)
 Called & spoke to patient in regard to lab results. Patient expressed concern about leakage on both legs. He stated that the cream prescribed has helped slow the leakage but it is still an on-going issue. Left leg is shaking during sleep. Please advise if you would like for me to schedule an appointment.

## 2024-01-09 NOTE — Telephone Encounter (Signed)
Called & spoke to the patient. Verified name & DOB. Informed of dermatology referral and their information for the patient to reach out and scheduled an appointment. Patient requested to see Dr.Johnson in the meantime, appointment scheduled for 01/11/2024. Patient confirmed appointment. No further assistance needed at this time.

## 2024-01-09 NOTE — Telephone Encounter (Signed)
He needs to see the dermatologist more so than me.  The referral was submitted when I saw him last month.  Marcus Nichols sent the referral to: [facility]  [address], [address], [place], [place] [address] Ph# [phone] Fax [phone]  He can call them to see if they have an appointment available within the next few weeks.  Let them know that we referred him.  In the meantime, you can give him next available appointment with me or any other provider in person.

## 2024-01-10 ENCOUNTER — Telehealth (HOSPITAL_COMMUNITY): Payer: Self-pay | Admitting: *Deleted

## 2024-01-10 NOTE — Telephone Encounter (Signed)
Fax received for PA of Risperidone 0.5mg . Submitted online with cover my meds. Awaiting decision.

## 2024-01-10 NOTE — Telephone Encounter (Signed)
Checked online with cover my meds. Risperidone approved until 01/09/25. Called to notify pharmacy.

## 2024-01-11 ENCOUNTER — Other Ambulatory Visit: Payer: Self-pay

## 2024-01-11 ENCOUNTER — Encounter: Payer: Self-pay | Admitting: Internal Medicine

## 2024-01-11 ENCOUNTER — Ambulatory Visit: Payer: Medicaid Other | Attending: Internal Medicine | Admitting: Internal Medicine

## 2024-01-11 VITALS — BP 126/70 | HR 73 | Temp 97.8°F | Ht 74.0 in | Wt 183.0 lb

## 2024-01-11 DIAGNOSIS — F17210 Nicotine dependence, cigarettes, uncomplicated: Secondary | ICD-10-CM

## 2024-01-11 DIAGNOSIS — Z23 Encounter for immunization: Secondary | ICD-10-CM

## 2024-01-11 DIAGNOSIS — Z1211 Encounter for screening for malignant neoplasm of colon: Secondary | ICD-10-CM

## 2024-01-11 DIAGNOSIS — L309 Dermatitis, unspecified: Secondary | ICD-10-CM

## 2024-01-11 MED ORDER — TRIAMCINOLONE ACETONIDE 0.025 % EX OINT
1.0000 | TOPICAL_OINTMENT | Freq: Two times a day (BID) | CUTANEOUS | 1 refills | Status: DC
  Filled 2024-01-11 – 2024-01-12 (×2): qty 80, 30d supply, fill #0
  Filled 2024-01-12: qty 60, 30d supply, fill #0
  Filled 2024-01-15: qty 80, 30d supply, fill #0
  Filled 2024-01-15: qty 80, 28d supply, fill #0

## 2024-01-11 MED ORDER — PREDNISONE 20 MG PO TABS
20.0000 mg | ORAL_TABLET | Freq: Every day | ORAL | 0 refills | Status: DC
  Filled 2024-01-11 (×2): qty 5, 5d supply, fill #0

## 2024-01-11 NOTE — Progress Notes (Signed)
Patient ID: Marcus Nichols, male    DOB: 1966/06/09  MRN: [id number]  CC: Leg Swelling (Leg swelling, leakage on both legs,odor, bloody x1 mo/Unable to get Eucerin/Flu vax administered on 01/11/2024 - C.A.)   Subjective: Marcus Nichols is a 58 y.o. 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 The rash is extensive, covering his legs, arms, and back. He has sought care at the emergency room several times for this since 06/2023.  Reports being told that it is eczema and was prescribed some prednisone pills. He has not been using any creams or ointments on the rash.   My assessment was: Looks like eczema but other possibilities is that it may be associated with his HIV. Encouraged him to get back in with ID. Referral submitted to dermatology. Will prescribe some triamcinolone ointment. Advised not to use it on his face.   Since then, patient saw his infectious disease specialist Dr. Drue Second on 12/20/2022.  She question whether it is a drug rash due to [employer].  Patient was changed to Dovato.  Screen for syphilis was negative.  Rash better but legs still itch and leaks at nights.  Thinks he needs antibiotics.  Appt with [facility] Derm not until Sept.  Patient Active Problem List   Diagnosis Date Noted   Prediabetes 12/15/2021   History of CVA with residual deficit 06/21/2021   Hyperglycemia 06/21/2021   Eczema 06/16/2021   Healthcare maintenance 05/12/2021   Hypertensive urgency 05/05/2021   Acute left-sided weakness 05/05/2021   Chest pain 05/05/2021   Alcohol abuse 05/05/2021   HIV (human immunodeficiency virus infection) (HCC) 05/05/2021    Tobacco use 05/05/2021   Polysubstance abuse (HCC) 05/05/2021   Paresthesia 05/05/2021   Schizophrenia (HCC) 05/05/2021   Right-sided headache 05/05/2021   Cerebral thrombosis with cerebral infarction 05/05/2021   Essential hypertension 03/20/2019   Opiate overdose (HCC) 03/19/2019   Leukocytosis 03/19/2019   Hypokalemia 03/19/2019   Left arm pain 03/19/2019   Elevated blood pressure reading 03/19/2019   Comminuted fracture of humerus, left, open, initial encounter 02/28/2019     Current Outpatient Medications on File Prior to Visit  Medication Sig Dispense Refill   amLODipine (NORVASC) 10 MG tablet Take 1 tablet (10 mg total) by mouth daily. 90 tablet 1   aspirin EC 81 MG tablet Take 1 tablet (81 mg total) by mouth daily. Swallow whole. 100 tablet 1   atorvastatin (LIPITOR) 20 MG tablet Take 1 tablet (20 mg total) by mouth daily. 90 tablet 0   dolutegravir-lamiVUDine (DOVATO) 50-300 MG tablet Take 1 tablet by mouth daily. 30 tablet 11   dolutegravir-lamiVUDine (DOVATO) 50-300 MG tablet Take 1 tablet by mouth daily for 14 doses.     risperiDONE (RISPERDAL) 0.5 MG tablet Take 1 tablet (0.5 mg total) by mouth at bedtime. 30 tablet 1   Vitamin D, Ergocalciferol, (DRISDOL) 1.25 MG (50000 UNIT) CAPS capsule Take 1 capsule (50,000 Units total) by mouth every 7 (seven) days. 12 capsule 1   Skin Protectants, Misc. (EUCERIN) cream Apply topically as needed for dry skin. (Patient not taking: Reported on 01/11/2024) 454 g 0   [DISCONTINUED] gabapentin (NEURONTIN)  300 MG capsule Take 2 capsules (600 mg total) by mouth 2 (two) times daily. (Patient not taking: Reported on 03/28/2020) 60 capsule 1   No current facility-administered medications on file prior to visit.    Allergies  Allergen Reactions   Grass Pollen(K-O-R-T-Swt Vern) Rash    Social History   Socioeconomic History   Marital status: Single    Spouse name: Not on file   Number of children: Not on file   Years of education: Not  on file   Highest education level: Not on file  Occupational History   Not on file  Tobacco Use   Smoking status: Every Day    Current packs/day: 1.00    Types: Cigarettes   Smokeless tobacco: Never  Vaping Use   Vaping status: Never Used  Substance and Sexual Activity   Alcohol use: Yes    Comment: beer   Drug use: Yes    Types: Cocaine, Marijuana    Comment: + Cocaine and +THC   Sexual activity: Not Currently    Comment: declined condoms  Other Topics Concern   Not on file  Social History Narrative   ** Merged History Encounter **       Social Drivers of Health   Financial Resource Strain: Medium Risk (12/05/2023)   Overall Financial Resource Strain (CARDIA)    Difficulty of Paying Living Expenses: Somewhat hard  Food Insecurity: Food Insecurity Present (12/05/2023)   Hunger Vital Sign    Worried About Running Out of Food in the Last Year: Sometimes true    Ran Out of Food in the Last Year: Sometimes true  Transportation Needs: Unmet Transportation Needs (12/05/2023)   PRAPARE - Transportation    Lack of Transportation (Medical): Yes    Lack of Transportation (Non-Medical): Yes  Physical Activity: Inactive (12/05/2023)   Exercise Vital Sign    Days of Exercise per Week: 0 days    Minutes of Exercise per Session: 0 min  Stress: Stress Concern Present (12/05/2023)   [employer] of Occupational Health - Occupational Stress Questionnaire    Feeling of Stress : Rather much  Social Connections: Socially Isolated (12/05/2023)   Social Connection and Isolation Panel [NHANES]    Frequency of Communication with Friends and Family: Twice a week    Frequency of Social Gatherings with Friends and Family: Once a week    Attends Religious Services: Never    Database administrator or Organizations: No    Attends Banker Meetings: Never    Marital Status: Never married  Intimate Partner Violence: Not At Risk (12/05/2023)   Humiliation, Afraid, Rape, and Kick questionnaire     Fear of Current or Ex-Partner: No    Emotionally Abused: No    Physically Abused: No    Sexually Abused: No    Family History  Problem Relation Age of Onset   Hypertension Mother    Congestive Heart Failure Mother     Past Surgical History:  Procedure Laterality Date   MULTIPLE TOOTH EXTRACTIONS     ORIF HUMERUS FRACTURE Left 03/07/2019   Procedure: OPEN REDUCTION INTERNAL FIXATION (ORIF) HUMERAL SHAFT FRACTURE;  Surgeon: Roby Lofts, MD;  Location: [facility] OR;  Service: Orthopedics;  Laterality: Left;    ROS: Review of Systems Negative except as stated above  PHYSICAL EXAM: BP 126/70 (BP Location: Left Arm, Patient Position: Sitting, Cuff Size: Normal)   Pulse 73   Temp 97.8 F (36.6 C) (Oral)   Ht 6\' 2"  (1.88 m)  Wt 183 lb (83 kg)   SpO2 100%   BMI 23.50 kg/m   Physical Exam   General appearance - alert, well appearing, and in no distress Mental status - normal mood, behavior, speech, dress, motor activity, and thought processes Skin -please see picture below.  Legs are very much improved from when I last saw him.  Still has dry fissuring of the skin.  No oozing or erythema noted at this time.     Latest Ref Rng & Units 12/21/2023    1:35 PM 12/11/2023    2:54 PM 12/11/2023   12:00 PM  CMP  Glucose 65 - 99 mg/dL 92  91    BUN 7 - 25 mg/dL 14  12    Creatinine 1.19 - 1.30 mg/dL 1.47  8.29    Sodium 562 - 146 mmol/L 139  139    Potassium 3.5 - 5.3 mmol/L 4.4  4.4    Chloride 98 - 110 mmol/L 102  104    CO2 20 - 32 mmol/L 27  26    Calcium 8.6 - 10.3 mg/dL 9.6  8.9    Total Protein 6.1 - 8.1 g/dL 8.5   7.4   Total Bilirubin 0.2 - 1.2 mg/dL 0.8   0.5   Alkaline Phos 44 - 121 IU/L   83   AST 10 - 35 U/L 48   43   ALT 9 - 46 U/L 36   33    Lipid Panel     Component Value Date/Time   CHOL 118 12/21/2023 1335   TRIG 191 (H) 12/21/2023 1335   HDL 43 12/21/2023 1335   CHOLHDL 2.7 12/21/2023 1335   VLDL 20 05/05/2021 0330   LDLCALC 49 12/21/2023 1335     CBC    Component Value Date/Time   WBC 5.3 12/21/2023 1335   RBC 5.08 12/21/2023 1335   HGB 15.4 12/21/2023 1335   HGB 15.7 10/28/2021 1352   HCT 46.9 12/21/2023 1335   HCT 45.7 10/28/2021 1352   PLT 263 12/21/2023 1335   PLT 279 10/28/2021 1352   MCV 92.3 12/21/2023 1335   MCV 88 10/28/2021 1352   MCH 30.3 12/21/2023 1335   MCHC 32.8 12/21/2023 1335   RDW 12.4 12/21/2023 1335   RDW 12.5 10/28/2021 1352   LYMPHSABS 2.2 07/10/2023 0143   MONOABS 0.8 07/10/2023 0143   EOSABS 440 12/21/2023 1335   BASOSABS 32 12/21/2023 1335    ASSESSMENT AND PLAN: 1. Eczema, unspecified type (Primary) This has improved.  Refill given on triamcinolone ointment.  I had my medical assistant given Ace wrap today for each leg.  Advised to wash and clean the legs in the mornings then dry them and then apply the triamcinolone ointment and then use the Ace wrap to wrap the legs.  Repeat the same in the evenings.  Advised to call the dermatology office and asked to be placed on their cancellation list if there is an opening that becomes available sooner - triamcinolone (KENALOG) 0.025 % ointment; Apply 1 Application topically 2 (two) times daily. Use on affected area daily.  DO not apply to face.  Dispense: 80 g; Refill: 1 - predniSONE (DELTASONE) 20 MG tablet; Take 1 tablet (20 mg total) by mouth daily with breakfast.  Dispense: 5 tablet; Refill: 0  2. Encounter for immunization - Flu vaccine trivalent PF, 6mos and older(Flulaval,Afluria,Fluarix,Fluzone)  3. Screening for colon cancer - Ambulatory referral to Gastroenterology  Patient was given the opportunity to ask questions.  Patient verbalized understanding of the plan and was able to repeat key elements of the plan.   This documentation was completed using Paediatric nurse.  Any transcriptional errors are unintentional.  Orders Placed This Encounter  Procedures   Flu vaccine trivalent PF, 6mos and  older(Flulaval,Afluria,Fluarix,Fluzone)   Ambulatory referral to Gastroenterology     Requested Prescriptions   Signed Prescriptions Disp Refills   triamcinolone (KENALOG) 0.025 % ointment 80 g 1    Sig: Apply 1 Application topically 2 (two) times daily on affected area daily.  DO NOT APPLY TO FACE.   predniSONE (DELTASONE) 20 MG tablet 5 tablet 0    Sig: Take 1 tablet (20 mg total) by mouth daily with breakfast.    No follow-ups on file.  Jonah Blue, MD, FACP

## 2024-01-12 ENCOUNTER — Other Ambulatory Visit: Payer: Self-pay

## 2024-01-15 ENCOUNTER — Other Ambulatory Visit: Payer: Self-pay

## 2024-01-15 ENCOUNTER — Other Ambulatory Visit (HOSPITAL_BASED_OUTPATIENT_CLINIC_OR_DEPARTMENT_OTHER): Payer: Self-pay

## 2024-01-17 ENCOUNTER — Telehealth: Payer: Self-pay | Admitting: Pharmacist

## 2024-01-17 NOTE — Telephone Encounter (Signed)
Cumulative HIV Genotype Data  Genotype Dates: Archive 12/31/23, Genotypes 1/25, 6/22  RT Mutations T27TS, K49KR, R83K, R211RK, V245E, D250DE, K281R, I293IV, K390R, E396D, A400S, A62AV  PI Mutations  K14KR, I15IV, L63C, A71AT, V77I  Integrase Mutations K7R,S17N,R20RK,T124N,T125TA,M154I,V165VI,D167E,V201I,D256E   Interpretation of Genotype Data per [employer] HIV Drug Resistance Database:  Nucleoside RTIs  Abacavir - susceptible Zidovudine - susceptible Emtricitabine - susceptible Lamivudine - susceptible Tenofovir - susceptible   Non-Nucleoside RTIs  Doravirine - susceptible Efavirenz - susceptible Etravirine - susceptible Nevirapine - susceptible Rilpivirine - susceptible   Protease Inhibitors  Atazanavir - susceptible Darunavir - susceptible Lopinavir - susceptible   Integrase Inhibitors  Bictegravir - susceptible Cabotegravir - susceptible Dolutegravir - susceptible Elvitegravir - susceptible Raltegravir - susceptible   If patient can demonstrate appropriate communication and timely appointments, then he would be an appropriate Cabenuva candidate later this year based on historical genotype.  Margarite Gouge, PharmD, CPP, BCIDP, AAHIVP Clinical Pharmacist Practitioner Infectious Diseases Clinical Pharmacist [facility]

## 2024-01-18 ENCOUNTER — Ambulatory Visit: Payer: Medicaid Other | Admitting: Family

## 2024-01-18 ENCOUNTER — Other Ambulatory Visit: Payer: Self-pay

## 2024-01-18 ENCOUNTER — Encounter: Payer: Self-pay | Admitting: Family

## 2024-01-18 ENCOUNTER — Ambulatory Visit (INDEPENDENT_AMBULATORY_CARE_PROVIDER_SITE_OTHER): Payer: Medicaid Other | Admitting: Family

## 2024-01-18 ENCOUNTER — Other Ambulatory Visit: Payer: Self-pay | Admitting: Pharmacist

## 2024-01-18 VITALS — BP 160/76 | HR 85 | Temp 98.2°F | Resp 16 | Wt 191.2 lb

## 2024-01-18 DIAGNOSIS — Z9189 Other specified personal risk factors, not elsewhere classified: Secondary | ICD-10-CM

## 2024-01-18 DIAGNOSIS — I10 Essential (primary) hypertension: Secondary | ICD-10-CM | POA: Diagnosis not present

## 2024-01-18 DIAGNOSIS — Z720 Tobacco use: Secondary | ICD-10-CM

## 2024-01-18 DIAGNOSIS — F1491 Cocaine use, unspecified, in remission: Secondary | ICD-10-CM | POA: Insufficient documentation

## 2024-01-18 DIAGNOSIS — L309 Dermatitis, unspecified: Secondary | ICD-10-CM

## 2024-01-18 DIAGNOSIS — F17210 Nicotine dependence, cigarettes, uncomplicated: Secondary | ICD-10-CM | POA: Diagnosis not present

## 2024-01-18 DIAGNOSIS — F331 Major depressive disorder, recurrent, moderate: Secondary | ICD-10-CM | POA: Insufficient documentation

## 2024-01-18 DIAGNOSIS — Z59819 Housing instability, housed unspecified: Secondary | ICD-10-CM

## 2024-01-18 DIAGNOSIS — Z21 Asymptomatic human immunodeficiency virus [HIV] infection status: Secondary | ICD-10-CM

## 2024-01-18 DIAGNOSIS — B20 Human immunodeficiency virus [HIV] disease: Secondary | ICD-10-CM | POA: Diagnosis present

## 2024-01-18 DIAGNOSIS — F1011 Alcohol abuse, in remission: Secondary | ICD-10-CM | POA: Insufficient documentation

## 2024-01-18 DIAGNOSIS — F17200 Nicotine dependence, unspecified, uncomplicated: Secondary | ICD-10-CM | POA: Insufficient documentation

## 2024-01-18 DIAGNOSIS — F4310 Post-traumatic stress disorder, unspecified: Secondary | ICD-10-CM | POA: Insufficient documentation

## 2024-01-18 DIAGNOSIS — Z0000 Encounter for general adult medical examination without abnormal findings: Secondary | ICD-10-CM

## 2024-01-18 MED ORDER — TRIAMCINOLONE ACETONIDE 0.025 % EX OINT: 1.0000 | TOPICAL_OINTMENT | Freq: Two times a day (BID) | CUTANEOUS | Status: DC

## 2024-01-18 MED ORDER — TRIAMCINOLONE ACETONIDE 0.025 % EX OINT
1.0000 | TOPICAL_OINTMENT | Freq: Two times a day (BID) | CUTANEOUS | 1 refills | Status: DC
Start: 2024-01-18 — End: 2024-04-03
  Filled 2024-01-18: qty 80, 28d supply, fill #0
  Filled 2024-02-16: qty 60, 30d supply, fill #1
  Filled 2024-03-14 – 2024-03-15 (×2): qty 15, 8d supply, fill #2

## 2024-01-18 NOTE — Assessment & Plan Note (Signed)
Mr. Deal has improved adherence and good tolerance to Dovato.  Reviewed previous lab work and discussed plan of care and U equals U.  Emphasize importance of taking medication daily to reduce risk of disease progression and complications in the future as well as routine follow-ups.  Check blood work.  Continue current dose of Dovato.  Currently covered by Medicaid.  Social determinants of health reviewed with need for housing assistance.  Plan for follow-up in 2 months or sooner if needed with lab work on the same day.

## 2024-01-18 NOTE — Progress Notes (Signed)
Brief Narrative   Patient ID: Marcus Nichols, male    DOB: [birth year], 58 y.o.   MRN: [id number]  Mr. Bache is a 58 y/o AA male diagnosed with HIV disease in 1999 with risk factor of drug use and heterosexual contact. No history of opportunistic infection. Genotype on 05/12/21 with Subtype B and no significant resistance patterns. Initial CD4 and viral load are unavailable.Previous ART experience with Sustiva, combivir, Genvoya and now [employer].     Subjective:    Chief Complaint  Patient presents with   Follow-up    b20    HPI:  Marcus Nichols is a 58 y.o. male with HIV disease last seen on 12/21/2023 by Dr. Drue Second with poorly controlled virus secondary to being off medication and concern for reaction to [facility].  Viral load is 43,900 with CD4 count 424.  Changed to Dovato with consideration for possible Cabenuva if resistance allows and able to improve follow-up attendance.  Resistance testing with no overt [place]/cabotegravir resistance.  Here today for follow-up.  [name] has been doing okay since his last office visit and has been taking Dovato as prescribed with no adverse side effects or problems obtaining medication from the pharmacy.  He feels better since starting the Dovato.  Rash seems to have resolved.  Not currently working and considering disability.  Trying to find housing and has been staying with friends intermittently.  Access to food is stable with food assistance program.  Transportation via the bus system and [place] as needed.  Interested in counseling.  Healthcare maintenance reviewed and due for routine dental care as well as colon cancer screening through colonoscopy.  Condoms and site-specific STD testing offered.  Denies fevers, chills, night sweats, headaches, changes in vision, neck pain/stiffness, nausea, diarrhea, vomiting, lesions or rashes.  Lab Results  Component Value Date   CD4TCELL 19 (L) 12/21/2023   CD4TABS 424 12/21/2023   Lab Results  Component  Value Date   HIV1RNAQUANT 43,900 (H) 12/21/2023     Allergies  Allergen Reactions   Grass Pollen(K-O-R-T-Swt Vern) Rash      Outpatient Medications Prior to Visit  Medication Sig Dispense Refill   amLODipine (NORVASC) 10 MG tablet Take 1 tablet (10 mg total) by mouth daily. 90 tablet 1   aspirin EC 81 MG tablet Take 1 tablet (81 mg total) by mouth daily. Swallow whole. 100 tablet 1   atorvastatin (LIPITOR) 20 MG tablet Take 1 tablet (20 mg total) by mouth daily. 90 tablet 0   dolutegravir-lamiVUDine (DOVATO) 50-300 MG tablet Take 1 tablet by mouth daily. 30 tablet 11   Vitamin D, Ergocalciferol, (DRISDOL) 1.25 MG (50000 UNIT) CAPS capsule Take 1 capsule (50,000 Units total) by mouth every 7 (seven) days. 12 capsule 1   risperiDONE (RISPERDAL) 0.5 MG tablet Take 1 tablet (0.5 mg total) by mouth at bedtime. (Patient not taking: Reported on 01/18/2024) 30 tablet 1   predniSONE (DELTASONE) 20 MG tablet Take 1 tablet (20 mg total) by mouth daily with breakfast. (Patient not taking: Reported on 01/18/2024) 5 tablet 0   Skin Protectants, Misc. (EUCERIN) cream Apply topically as needed for dry skin. (Patient not taking: Reported on 01/18/2024) 454 g 0   triamcinolone (KENALOG) 0.025 % ointment Apply 1 Application topically 2 (two) times daily on affected area daily.  DO NOT APPLY TO FACE. (Patient not taking: Reported on 01/18/2024) 80 g 1   No facility-administered medications prior to visit.     Past Medical History:  Diagnosis Date  Asthma    Eczema    Enlarged prostate    HIV (human immunodeficiency virus infection) (HCC)    Humerus fracture    left    Inguinal hernia    Schizophrenia (HCC)    Sleep apnea    does not wear CPAP ( per spouse )     Past Surgical History:  Procedure Laterality Date   MULTIPLE TOOTH EXTRACTIONS     ORIF HUMERUS FRACTURE Left 03/07/2019   Procedure: OPEN REDUCTION INTERNAL FIXATION (ORIF) HUMERAL SHAFT FRACTURE;  Surgeon: Roby Lofts, MD;   Location: [facility] OR;  Service: Orthopedics;  Laterality: Left;      Review of Systems  Constitutional:  Negative for appetite change, chills, fatigue, fever and unexpected weight change.  Eyes:  Negative for visual disturbance.  Respiratory:  Negative for cough, chest tightness, shortness of breath and wheezing.   Cardiovascular:  Negative for chest pain and leg swelling.  Gastrointestinal:  Negative for abdominal pain, constipation, diarrhea, nausea and vomiting.  Genitourinary:  Negative for dysuria, flank pain, frequency, genital sores, hematuria and urgency.  Skin:  Negative for rash.  Allergic/Immunologic: Negative for immunocompromised state.  Neurological:  Negative for dizziness and headaches.      Objective:    BP (!) 160/76   Pulse 85   Temp 98.2 F (36.8 C) (Oral)   Resp 16   Wt 191 lb 3.2 oz (86.7 kg)   SpO2 100%   BMI 24.55 kg/m  Nursing note and vital signs reviewed.  Physical Exam Constitutional:      General: He is not in acute distress.    Appearance: He is well-developed.  Eyes:     Conjunctiva/sclera: Conjunctivae normal.  Cardiovascular:     Rate and Rhythm: Normal rate and regular rhythm.     Heart sounds: Normal heart sounds. No murmur heard.    No friction rub. No gallop.  Pulmonary:     Effort: Pulmonary effort is normal. No respiratory distress.     Breath sounds: Normal breath sounds. No wheezing or rales.  Chest:     Chest wall: No tenderness.  Abdominal:     General: Bowel sounds are normal.     Palpations: Abdomen is soft.     Tenderness: There is no abdominal tenderness.  Musculoskeletal:     Cervical back: Neck supple.  Lymphadenopathy:     Cervical: No cervical adenopathy.  Skin:    General: Skin is warm and dry.     Findings: No rash.  Neurological:     Mental Status: He is alert and oriented to person, place, and time.  Psychiatric:        Behavior: Behavior normal.        Thought Content: Thought content normal.         Judgment: Judgment normal.         01/18/2024    2:44 PM 01/11/2024    1:58 PM 12/05/2023    4:22 PM 12/01/2022    3:02 PM 12/31/2021    9:48 AM  Depression screen PHQ 2/9  Decreased Interest 0 3 1 0 0  Down, Depressed, Hopeless 3 2 2  0 0  PHQ - 2 Score 3 5 3  0 0  Altered sleeping  0 2    Tired, decreased energy  1 2    Change in appetite  2 2    Feeling bad or failure about yourself   1 1    Trouble concentrating  3 1  Moving slowly or fidgety/restless  1 0    Suicidal thoughts  0 0    PHQ-9 Score  13 11    Difficult doing work/chores  Very difficult Somewhat difficult         Assessment & Plan:    Patient Active Problem List   Diagnosis Date Noted   Housing instability 01/18/2024   Prediabetes 12/15/2021   History of CVA with residual deficit 06/21/2021   Hyperglycemia 06/21/2021   Eczema 06/16/2021   Healthcare maintenance 05/12/2021   Hypertensive urgency 05/05/2021   Acute left-sided weakness 05/05/2021   Chest pain 05/05/2021   Alcohol abuse 05/05/2021   HIV (human immunodeficiency virus infection) (HCC) 05/05/2021   Tobacco use 05/05/2021   Polysubstance abuse (HCC) 05/05/2021   Paresthesia 05/05/2021   Schizophrenia (HCC) 05/05/2021   Right-sided headache 05/05/2021   Cerebral thrombosis with cerebral infarction 05/05/2021   Essential hypertension 03/20/2019   Opiate overdose (HCC) 03/19/2019   Leukocytosis 03/19/2019   Hypokalemia 03/19/2019   Left arm pain 03/19/2019   Elevated blood pressure reading 03/19/2019   Comminuted fracture of humerus, left, open, initial encounter 02/28/2019     Problem List Items Addressed This Visit       Cardiovascular and Mediastinum   Essential hypertension   Blood pressure elevated today above goal of 140/90 with current dose of amlodipine.  No neurologic or ophthalmologic signs/symptoms.  Encouraged to monitor blood pressure at home.  Continue management per internal medicine.        Musculoskeletal and  Integument   Eczema   Relevant Medications   triamcinolone (KENALOG) 0.025 % ointment     Other   HIV (human immunodeficiency virus infection) (HCC) - Primary   Mr. Gassman has improved adherence and good tolerance to Dovato.  Reviewed previous lab work and discussed plan of care and U equals U.  Emphasize importance of taking medication daily to reduce risk of disease progression and complications in the future as well as routine follow-ups.  Check blood work.  Continue current dose of Dovato.  Currently covered by Medicaid.  Social determinants of health reviewed with need for housing assistance.  Plan for follow-up in 2 months or sooner if needed with lab work on the same day.      Relevant Orders   COMPLETE METABOLIC PANEL WITH GFR   HIV-1 RNA quant-no reflex-bld   T-helper cell (CD4)- ([facility] clinic only)   Tobacco use   Mr. Greulich continues to smoke tobacco. Discussed importance of tobacco cessation to reduce risk of disease development and complications in the future. Pack year history of 3 at present. Methods of tobacco cessation discussed. Not ready to quit at this time.       Healthcare maintenance   Discussed importance of safe sexual practice and condom use. Condoms and site specific STD testing offered.  Vaccinations reviewed - declined today. Due for dental care with referral to [facility] placed.  Due for colonoscopy with referral to GI placed.       Housing instability   Mr. Laurie has housing instability and currently staying with friends on occasion. Will refer to [facility] for housing assistance.       Other Visit Diagnoses       At risk for colon cancer       Relevant Orders   Ambulatory referral to Gastroenterology        I have discontinued Charna Archer eucerin and predniSONE. I am also having him maintain his amLODipine, aspirin EC,  atorvastatin, dolutegravir-lamiVUDine, risperiDONE, Vitamin D (Ergocalciferol), and triamcinolone.   Meds ordered this  encounter  Medications   triamcinolone (KENALOG) 0.025 % ointment    Sig: Apply 1 Application topically 2 (two) times daily on affected area daily.  DO NOT APPLY TO FACE.    Supervising Provider:   Judyann Munson [phone]     Follow-up: Return in about 2 months (around 03/17/2024). or sooner if needed.    Marcos Eke, MSN, FNP-C Nurse Practitioner [facility] for Infectious Disease [facility] Main number: [phone]

## 2024-01-18 NOTE — Assessment & Plan Note (Signed)
Marcus Nichols continues to smoke tobacco. Discussed importance of tobacco cessation to reduce risk of disease development and complications in the future. Pack year history of 3 at present. Methods of tobacco cessation discussed. Not ready to quit at this time.

## 2024-01-18 NOTE — Assessment & Plan Note (Signed)
Discussed importance of safe sexual practice and condom use. Condoms and site specific STD testing offered.  Vaccinations reviewed - declined today. Due for dental care with referral to [facility] placed.  Due for colonoscopy with referral to GI placed.

## 2024-01-18 NOTE — Patient Instructions (Addendum)
Nice to see you.  We will check your lab work today.  Continue to take your medication daily as prescribed.  Please call [facility]) to schedule/follow up on your dental care at [phone]  Refills have been sent to the pharmacy.  Plan for follow up in 2 months or sooner if needed with lab work on the same day.  Have a great day and stay safe!   [facility] [phone] Crisis Line [phone]

## 2024-01-18 NOTE — Assessment & Plan Note (Signed)
[name] has housing instability and currently staying with friends on occasion. Will refer to [facility] for housing assistance.

## 2024-01-18 NOTE — Assessment & Plan Note (Signed)
Blood pressure elevated today above goal of 140/90 with current dose of amlodipine.  No neurologic or ophthalmologic signs/symptoms.  Encouraged to monitor blood pressure at home.  Continue management per internal medicine.

## 2024-01-19 LAB — T-HELPER CELL (CD4) - (RCID CLINIC ONLY)
CD4 % Helper T Cell: 25 % — ABNORMAL LOW (ref 33–65)
CD4 T Cell Abs: 306 /uL — ABNORMAL LOW (ref 400–1790)

## 2024-01-20 LAB — COMPLETE METABOLIC PANEL WITH GFR
AG Ratio: 1.2 (calc) (ref 1.0–2.5)
ALT: 31 U/L (ref 9–46)
AST: 28 U/L (ref 10–35)
Albumin: 4.3 g/dL (ref 3.6–5.1)
Alkaline phosphatase (APISO): 66 U/L (ref 35–144)
BUN: 14 mg/dL (ref 7–25)
CO2: 28 mmol/L (ref 20–32)
Calcium: 9.5 mg/dL (ref 8.6–10.3)
Chloride: 103 mmol/L (ref 98–110)
Creat: 0.78 mg/dL (ref 0.70–1.30)
Globulin: 3.6 g/dL (ref 1.9–3.7)
Glucose, Bld: 121 mg/dL — ABNORMAL HIGH (ref 65–99)
Potassium: 4.4 mmol/L (ref 3.5–5.3)
Sodium: 138 mmol/L (ref 135–146)
Total Bilirubin: 0.4 mg/dL (ref 0.2–1.2)
Total Protein: 7.9 g/dL (ref 6.1–8.1)
eGFR: 103 mL/min/{1.73_m2} (ref >=60)

## 2024-01-20 LAB — HIV-1 RNA QUANT-NO REFLEX-BLD
HIV 1 RNA Quant: 45 {copies}/mL — ABNORMAL HIGH
HIV-1 RNA Quant, Log: 1.65 {Log} — ABNORMAL HIGH

## 2024-01-22 ENCOUNTER — Telehealth: Payer: Self-pay

## 2024-01-22 NOTE — Telephone Encounter (Signed)
 Spoke with Harvie Heck, relayed that viral load undetectable and CD4 count healthy at 306. Relayed kidney & liver function and electrolytes were within normal limits.   He is asking about second dose of Shingrix vaccine. States he had the [facility] dose at [facility] "a while ago."   Per [facility], Zoster was administered 07/26/23.  [facility] for second dose.   Sandie Ano, RN

## 2024-01-22 NOTE — Telephone Encounter (Signed)
-----   Message from Jeanine Luz sent at 01/22/2024  3:39 PM EST ----- Please inform Marcus Nichols that his viral load is undetectable at 45 and CD4 count is 306. Kidney function, liver function and electrolytes within normal ranges. Thanks!

## 2024-02-01 ENCOUNTER — Encounter (HOSPITAL_COMMUNITY): Payer: MEDICAID | Admitting: Student

## 2024-02-04 NOTE — Progress Notes (Unsigned)
   S:    PCP: Dr. Laural Benes   Patient was referred by Primary Care Provider, Dr. Laural Benes, on 12/05/2023, and last seen on 01/11/24. 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 At last appt with Dr. Laural Benes, BP was controlled at 126/70 mmHg. No medication changes were made.   Patient arrives in good spirits. Presents to the clinic for follow-up hypertension evaluation, counseling, and management. Patient reports that he has been able to stay adherent to medications. He does not have a blood pressure cuff at home - would be interested in obtaining one via mail order. Provides updated address: [address] [place], [address].   Current BP Medications include:   -Amlodipine 10 mg daily - planning to refill today  Antihypertensives tried in the past include:  -hydrochlorothiazide  -losartan  Dietary habits include: tries to comply with sodium restriction. Denies excessive intake of caffeine.  Exercise habits include: none  Family / Social history:  -Mother: HTN, CHF -Current smoker, attempting to quit. - had 2 cigars today (30-40 min ago) -Denies other illicit substance use  ASCVD risk factors include: HTN, stroke, smoking  O:  There were no vitals filed for this visit.   Home BP readings: none. Patient reported willingness to begin measuring blood pressure at home.  Last 3 Office BP readings: BP Readings from Last 3 Encounters:  01/18/24 (!) 160/76  01/11/24 126/70  01/05/24 135/70    BMET    Component Value Date/Time   NA 138 01/18/2024 1517   NA 142 12/14/2021 1439   K 4.4 01/18/2024 1517   CL 103 01/18/2024 1517   CO2 28 01/18/2024 1517   GLUCOSE 121 (H) 01/18/2024 1517   BUN 14 01/18/2024 1517   BUN 11 12/14/2021 1439   CREATININE 0.78 01/18/2024 1517   CALCIUM 9.5 01/18/2024 1517   GFRNONAA >60 12/11/2023 1454   GFRAA 54 (L) 03/28/2020 1855   Lipid Panel      Component Value Date/Time   CHOL 118 12/21/2023 1335   TRIG 191 (H) 12/21/2023 1335   HDL 43 12/21/2023 1335   CHOLHDL 2.7 12/21/2023 1335   VLDL 20 05/05/2021 0330   LDLCALC 49 12/21/2023 1335    Renal function: CrCl cannot be calculated (Unknown ideal weight.).  Clinical ASCVD: Yes  The ASCVD Risk score (Arnett DK, et al., 2019) failed to calculate for the following reasons:   Risk score cannot be calculated because patient has a medical history suggesting prior/existing ASCVD  A/P: Hypertension longstanding, currently at goal on current medications. BP Goal = < 130/80 mmHg. Discussed that tobacco cessation will improve risk of recurrent ACS. Medication adherence appears optimal at this time. Will order home BP cuff, which is covered by his Medicaid plan.  -Continue amlodipine 10 mg daily -Recommend monitoring BP at home at least a couple times per week. Discussed appropriate home monitoring technique. Will order supplies.  -Counseled on lifestyle modifications for blood pressure control including reduced dietary sodium, increased exercise, adequate sleep, and tobacco cessation  Total time in face-to-face counseling 25 minutes.   Follow-up PCP: 03/04/24 Pharmacist: 04/15/24  Nils Pyle, PharmD PGY1 Pharmacy Resident  Butch Penny, PharmD, BCACP, CPP Clinical Pharmacist [facility] [phone]

## 2024-02-05 ENCOUNTER — Ambulatory Visit: Payer: MEDICAID | Attending: Internal Medicine | Admitting: Pharmacist

## 2024-02-05 ENCOUNTER — Other Ambulatory Visit: Payer: Self-pay

## 2024-02-05 ENCOUNTER — Ambulatory Visit: Payer: MEDICAID | Admitting: Pharmacist

## 2024-02-05 ENCOUNTER — Encounter: Payer: Self-pay | Admitting: Pharmacist

## 2024-02-05 VITALS — BP 129/67 | HR 76

## 2024-02-05 DIAGNOSIS — I10 Essential (primary) hypertension: Secondary | ICD-10-CM

## 2024-02-05 MED ORDER — BLOOD PRESSURE CUFF MISC: 0 refills | Status: AC

## 2024-02-15 ENCOUNTER — Telehealth: Payer: Self-pay | Admitting: Pharmacist

## 2024-02-15 NOTE — Telephone Encounter (Signed)
 Received fax from [employer] services (attn: Albertina Parr) stating that [employer]'s genosure archive from 12/28/2023 would not be approved through insurance without a letter of medical necessity along with chart note documentation. Filled out letter of medical necessity form and faxed it along with chart notes.  Margarite Gouge, PharmD, CPP, BCIDP, AAHIVP Clinical Pharmacist Practitioner Infectious Diseases Clinical Pharmacist [facility]

## 2024-02-16 ENCOUNTER — Other Ambulatory Visit: Payer: Self-pay

## 2024-02-16 ENCOUNTER — Other Ambulatory Visit: Payer: Self-pay | Admitting: Internal Medicine

## 2024-02-16 DIAGNOSIS — L309 Dermatitis, unspecified: Secondary | ICD-10-CM

## 2024-02-29 ENCOUNTER — Other Ambulatory Visit: Payer: Self-pay

## 2024-03-04 ENCOUNTER — Ambulatory Visit: Payer: MEDICAID | Attending: Internal Medicine | Admitting: Internal Medicine

## 2024-03-14 ENCOUNTER — Other Ambulatory Visit: Payer: Self-pay

## 2024-03-14 ENCOUNTER — Encounter: Payer: Self-pay | Admitting: Family

## 2024-03-14 ENCOUNTER — Ambulatory Visit: Payer: MEDICAID | Admitting: Family

## 2024-03-14 ENCOUNTER — Other Ambulatory Visit: Payer: MEDICAID

## 2024-03-14 ENCOUNTER — Other Ambulatory Visit: Payer: Self-pay | Admitting: Pharmacist

## 2024-03-14 ENCOUNTER — Other Ambulatory Visit (HOSPITAL_COMMUNITY): Payer: Self-pay

## 2024-03-14 ENCOUNTER — Other Ambulatory Visit: Payer: Self-pay | Admitting: Internal Medicine

## 2024-03-14 ENCOUNTER — Ambulatory Visit (INDEPENDENT_AMBULATORY_CARE_PROVIDER_SITE_OTHER): Payer: MEDICAID | Admitting: Family

## 2024-03-14 VITALS — BP 123/80 | HR 69 | Temp 97.9°F

## 2024-03-14 DIAGNOSIS — F17200 Nicotine dependence, unspecified, uncomplicated: Secondary | ICD-10-CM

## 2024-03-14 DIAGNOSIS — F29 Unspecified psychosis not due to a substance or known physiological condition: Secondary | ICD-10-CM

## 2024-03-14 DIAGNOSIS — Z21 Asymptomatic human immunodeficiency virus [HIV] infection status: Secondary | ICD-10-CM

## 2024-03-14 DIAGNOSIS — L309 Dermatitis, unspecified: Secondary | ICD-10-CM

## 2024-03-14 DIAGNOSIS — Z720 Tobacco use: Secondary | ICD-10-CM

## 2024-03-14 DIAGNOSIS — F17290 Nicotine dependence, other tobacco product, uncomplicated: Secondary | ICD-10-CM

## 2024-03-14 DIAGNOSIS — Z0000 Encounter for general adult medical examination without abnormal findings: Secondary | ICD-10-CM

## 2024-03-14 MED ORDER — PREDNISONE 20 MG PO TABS
20.0000 mg | ORAL_TABLET | Freq: Every day | ORAL | 0 refills | Status: DC
  Filled 2024-03-14: qty 5, 5d supply, fill #0

## 2024-03-14 MED ORDER — DOLUTEGRAVIR-LAMIVUDINE 50-300 MG PO TABS
1.0000 | ORAL_TABLET | Freq: Every day | ORAL | 4 refills | Status: DC
  Filled 2024-03-14: qty 30, 30d supply, fill #0
  Filled 2024-04-02: qty 30, 30d supply, fill #1
  Filled 2024-05-07: qty 30, 30d supply, fill #2
  Filled 2024-06-13: qty 30, 30d supply, fill #3
  Filled 2024-07-15: qty 30, 30d supply, fill #4

## 2024-03-14 MED ORDER — ATORVASTATIN CALCIUM 20 MG PO TABS
20.0000 mg | ORAL_TABLET | Freq: Every day | ORAL | 0 refills | Status: DC
  Filled 2024-03-14: qty 90, 90d supply, fill #0

## 2024-03-14 NOTE — Assessment & Plan Note (Signed)
 Discussed importance of safe sexual practice and condom use. Condoms and site specific STD testing offered.  Vaccinations reviewed and declined following counseling Due for colon cancer screening with GI contact information provided in AVS. Due for routine dental care and will follow up with housing and dental care referrals.

## 2024-03-14 NOTE — Assessment & Plan Note (Signed)
 Marcus Nichols reports taking his risperidone but does not appear to have a prescription or available fill history. This is likely contributing to his functional abilities. May need follow up with Psychiatry.

## 2024-03-14 NOTE — Assessment & Plan Note (Signed)
 Marcus Nichols continues to smoke tobacco in the form of cigars. Counseled on the dangers of tobacco not ready to quit at this time.  Reviewed strategies to maximize success, including removing cigarettes and smoking materials from environment, stress management, substitution of other forms of reinforcement, support of family/friends, and written materials.

## 2024-03-14 NOTE — Assessment & Plan Note (Signed)
 Marcus Nichols had well controlled virus with good adherence and tolerance to Dovato, however has now been off medication for the past week. Discussed importance of contacting clinic for difficulties in obtaining medication. Sample of Dovato provided and will transfer prescription to [facility] and have it delivered to [facility] for pick up. Covered by [employer]. Social determinants of health reviewed and working on housing. Check lab work. Continue current dose of Dovato. Plan for follow up in 2 months or sooner if needed with lab work on the same day.

## 2024-03-14 NOTE — Patient Instructions (Addendum)
 Nice to see you.  Continue to take your medication daily as prescribed.  Refills have been sent to the pharmacy.  Plan for follow up in 2 months or sooner if needed with lab work on the same day.  Have a great day and stay safe!  Call for Colonoscopy  Placed in  [facility] [address] [place], [place] [address] PH# [phone]   Quitting Smoking Learn how quitting smoking can help prevent many health problems and improve your overall health. To view the content, go to this web address: https://pe.elsevier.com/[email or web address]  This video will expire on: 11/08/2025. If you need access to this video following this date, please reach out to the healthcare provider who assigned it to you. This information is not intended to replace advice given to you by your health care provider. Make sure you discuss any questions you have with your health care provider. Elsevier Patient Education  2024 [employer].

## 2024-03-14 NOTE — Assessment & Plan Note (Signed)
 Increased dryness leading to flare and itching. Start prednisone daily for 5 days. Encouraged moisturizing and triamcinolone cream for routine management.

## 2024-03-14 NOTE — Progress Notes (Signed)
 Specialty Pharmacy Initiation Note   Marcus Nichols is a 58 y.o. male who will be followed by the specialty pharmacy service for RxSp HIV    Review of administration, indication, effectiveness, safety, potential side effects, storage/disposable, and missed dose instructions occurred today for patient's specialty medication(s) Dolutegravir-lamiVUDine (DOVATO)     Patient/Caregiver did not have any additional questions or concerns.   Patient's therapy is appropriate to: Continue    Goals Addressed             This Visit's Progress    Achieve Undetectable HIV Viral Load < 20       Patient is not on track and improving. Patient will work on increased adherence      Maintain optimal adherence to therapy       Patient is not on track and improving. Patient will work on increased adherence         Sonya Duster Specialty Pharmacist

## 2024-03-14 NOTE — Progress Notes (Signed)
 Brief Narrative   Patient ID: Marcus Nichols, male    DOB: 03/16/1966, 58 y.o.   MRN: [id number]  Mr. Durio is a 58 y/o AA male diagnosed with HIV disease in 1999 with risk factor of drug use and heterosexual contact. No history of opportunistic infection. Genotype on 05/12/21 with Subtype B and no significant resistance patterns. Initial CD4 and viral load are unavailable.Previous ART experience with [employer], combivir, [employer] and now [employer].     Subjective:    Chief Complaint  Patient presents with   Follow-up    B20; 7-8 days off Dovato due to  pharmacy.     HPI:  Abdulhadi Stopa is a 58 y.o. male with HIV disease last seen on 01/18/2024 with improved adherence and good tolerance to Dovato.  Viral load was undetectable at 45 with CD4 count 306.  [facility] to [facility] with [facility]. Here today for follow up.   Mr. Macrae has been doing okay since his last office visit.  Has been having issues obtaining medication from the pharmacy and has been off his Dovato for at least 1 week.  Called pharmacy and is informed medication is ready but when he attempts to pick it up they are not able to provide it to him.  Good tolerance when taking medication.  Gastroenterology attempted to contact and was unable to reach him and will provide contact number in after visit summary.  Awaiting contact from [facility].  Has concerns of flares of his eczema today that are usually controlled with prednisone and requesting refill of prednisone.  Site-specific STD testing and condoms offered.  Healthcare maintenance reviewed.  Denies fevers, chills, night sweats, headaches, changes in vision, neck pain/stiffness, nausea, diarrhea, vomiting, lesions or rashes.  Lab Results  Component Value Date   CD4TCELL 25 (L) 01/18/2024   CD4TABS 306 (L) 01/18/2024   Lab Results  Component Value Date   HIV1RNAQUANT 45 (H) 01/18/2024     Allergies  Allergen Reactions   Grass Pollen(K-O-R-T-Swt Vern) Rash       Outpatient Medications Prior to Visit  Medication Sig Dispense Refill   amLODipine (NORVASC) 10 MG tablet Take 1 tablet (10 mg total) by mouth daily. 90 tablet 1   aspirin EC 81 MG tablet Take 1 tablet (81 mg total) by mouth daily. Swallow whole. 100 tablet 1   atorvastatin (LIPITOR) 20 MG tablet Take 1 tablet (20 mg total) by mouth daily. 90 tablet 0   Blood Pressure Monitoring (BLOOD PRESSURE CUFF) MISC Use as directed to monitor blood pressure 1 each 0   triamcinolone (KENALOG) 0.025 % ointment Apply 1 Application topically 2 (two) times daily on affected area daily.  DO NOT APPLY TO FACE. 80 g 1   Vitamin D, Ergocalciferol, (DRISDOL) 1.25 MG (50000 UNIT) CAPS capsule Take 1 capsule (50,000 Units total) by mouth every 7 (seven) days. 12 capsule 1   risperiDONE (RISPERDAL) 0.5 MG tablet Take 1 tablet (0.5 mg total) by mouth at bedtime. (Patient not taking: Reported on 01/18/2024) 30 tablet 1   dolutegravir-lamiVUDine (DOVATO) 50-300 MG tablet Take 1 tablet by mouth daily. (Patient not taking: Reported on 03/14/2024) 30 tablet 11   No facility-administered medications prior to visit.     Past Medical History:  Diagnosis Date   Asthma    Eczema    Enlarged prostate    HIV (human immunodeficiency virus infection) (HCC)    Humerus fracture    left    Inguinal hernia  Schizophrenia (HCC)    Sleep apnea    does not wear CPAP ( per spouse )     Past Surgical History:  Procedure Laterality Date   MULTIPLE TOOTH EXTRACTIONS     ORIF HUMERUS FRACTURE Left 03/07/2019   Procedure: OPEN REDUCTION INTERNAL FIXATION (ORIF) HUMERAL SHAFT FRACTURE;  Surgeon: Roby Lofts, MD;  Location: [facility] OR;  Service: Orthopedics;  Laterality: Left;      Review of Systems  Constitutional:  Negative for appetite change, chills, fatigue, fever and unexpected weight change.  Eyes:  Negative for visual disturbance.  Respiratory:  Negative for cough, chest tightness, shortness of breath and  wheezing.   Cardiovascular:  Negative for chest pain and leg swelling.  Gastrointestinal:  Negative for abdominal pain, constipation, diarrhea, nausea and vomiting.  Genitourinary:  Negative for dysuria, flank pain, frequency, genital sores, hematuria and urgency.  Skin:  Negative for rash.  Allergic/Immunologic: Negative for immunocompromised state.  Neurological:  Negative for dizziness and headaches.      Objective:    BP 123/80   Pulse 69   Temp 97.9 F (36.6 C) (Oral)   SpO2 99%  Nursing note and vital signs reviewed.  Physical Exam Constitutional:      General: He is not in acute distress.    Appearance: He is well-developed.  Eyes:     Conjunctiva/sclera: Conjunctivae normal.  Cardiovascular:     Rate and Rhythm: Normal rate and regular rhythm.     Heart sounds: Normal heart sounds. No murmur heard.    No friction rub. No gallop.  Pulmonary:     Effort: Pulmonary effort is normal. No respiratory distress.     Breath sounds: Normal breath sounds. No wheezing or rales.  Chest:     Chest wall: No tenderness.  Abdominal:     General: Bowel sounds are normal.     Palpations: Abdomen is soft.     Tenderness: There is no abdominal tenderness.  Musculoskeletal:     Cervical back: Neck supple.  Lymphadenopathy:     Cervical: No cervical adenopathy.  Skin:    General: Skin is warm and dry.     Findings: No rash.  Neurological:     Mental Status: He is alert and oriented to person, place, and time.  Psychiatric:        Behavior: Behavior normal.        Thought Content: Thought content normal.        Judgment: Judgment normal.         03/14/2024    1:45 PM 01/18/2024    2:44 PM 01/11/2024    1:58 PM 12/05/2023    4:22 PM 12/01/2022    3:02 PM  Depression screen PHQ 2/9  Decreased Interest 0 0 3 1 0  Down, Depressed, Hopeless 1 3 2 2  0  PHQ - 2 Score 1 3 5 3  0  Altered sleeping 2  0 2   Tired, decreased energy 2  1 2    Change in appetite 0  2 2   Feeling bad or  failure about yourself  0  1 1   Trouble concentrating 1  3 1    Moving slowly or fidgety/restless 1  1 0   Suicidal thoughts 0  0 0   PHQ-9 Score 7  13 11    Difficult doing work/chores   Very difficult Somewhat difficult         03/14/2024    1:46 PM 01/11/2024    1:58 PM  12/05/2023    4:22 PM  GAD 7 : Generalized Anxiety Score  Nervous, Anxious, on Edge 0 1 1  Control/stop worrying 0 2 1  Worry too much - different things 0 2 1  Trouble relaxing 0 2 1  Restless 0 2 2  Easily annoyed or irritable 0 2 1  Afraid - awful might happen 0 2 1  Total GAD 7 Score 0 13 8  Anxiety Difficulty  Very difficult Somewhat difficult     The ASCVD Risk score (Arnett DK, et al., 2019) failed to calculate for the following reasons:   Risk score cannot be calculated because patient has a medical history suggesting prior/existing ASCVD      Assessment & Plan:    Patient Active Problem List   Diagnosis Date Noted   Housing instability 01/18/2024   PTSD (post-traumatic stress disorder) 01/18/2024   History of cocaine use 01/18/2024   History of alcohol abuse 01/18/2024   Nicotine use disorder 01/18/2024   MDD (major depressive disorder), recurrent episode, moderate (HCC) 01/18/2024   Prediabetes 12/15/2021   History of CVA with residual deficit 06/21/2021   Hyperglycemia 06/21/2021   Eczema 06/16/2021   Healthcare maintenance 05/12/2021   Hypertensive urgency 05/05/2021   Acute left-sided weakness 05/05/2021   Chest pain 05/05/2021   Alcohol abuse 05/05/2021   HIV (human immunodeficiency virus infection) (HCC) 05/05/2021   Tobacco use 05/05/2021   Polysubstance abuse (HCC) 05/05/2021   Paresthesia 05/05/2021   Schizophrenia spectrum disorder with psychotic disorder type not yet determined (HCC) 05/05/2021   Right-sided headache 05/05/2021   Cerebral thrombosis with cerebral infarction 05/05/2021   Essential hypertension 03/20/2019   Opiate overdose (HCC) 03/19/2019   Leukocytosis  03/19/2019   Hypokalemia 03/19/2019   Left arm pain 03/19/2019   Elevated blood pressure reading 03/19/2019   Comminuted fracture of humerus, left, open, initial encounter 02/28/2019     Problem List Items Addressed This Visit       Musculoskeletal and Integument   Eczema   Increased dryness leading to flare and itching. Start prednisone daily for 5 days. Encouraged moisturizing and triamcinolone cream for routine management.         Other   HIV (human immunodeficiency virus infection) (HCC) - Primary   Mr. Dickenson had well controlled virus with good adherence and tolerance to Dovato, however has now been off medication for the past week. Discussed importance of contacting clinic for difficulties in obtaining medication. Sample of Dovato provided and will transfer prescription to [facility] and have it delivered to [facility] for pick up. Covered by [employer]. Social determinants of health reviewed and working on housing. Check lab work. Continue current dose of Dovato. Plan for follow up in 2 months or sooner if needed with lab work on the same day.       Relevant Medications   dolutegravir-lamiVUDine (DOVATO) 50-300 MG tablet   Tobacco use   Schizophrenia spectrum disorder with psychotic disorder type not yet determined [facility])   Mr. Coy reports taking his risperidone but does not appear to have a prescription or available fill history. This is likely contributing to his functional abilities. May need follow up with Psychiatry.       Healthcare maintenance   Discussed importance of safe sexual practice and condom use. Condoms and site specific STD testing offered.  Vaccinations reviewed and declined following counseling Due for colon cancer screening with GI contact information provided in AVS. Due for routine dental care and will follow up with  housing and dental care referrals.       Nicotine use disorder   Mr. Tomasetti continues to smoke tobacco in the form of cigars. Counseled on  the dangers of tobacco not ready to quit at this time.  Reviewed strategies to maximize success, including removing cigarettes and smoking materials from environment, stress management, substitution of other forms of reinforcement, support of family/friends, and written materials.          I am having Adelita Agar maintain his amLODipine, aspirin EC, atorvastatin, risperiDONE, Vitamin D (Ergocalciferol), triamcinolone, Blood Pressure Cuff, predniSONE, and dolutegravir-lamiVUDine.   Meds ordered this encounter  Medications   predniSONE (DELTASONE) 20 MG tablet    Sig: Take 1 tablet (20 mg total) by mouth daily with breakfast.    Dispense:  5 tablet    Refill:  0    Supervising Provider:   SNIDER, CYNTHIA [[id number]]   dolutegravir-lamiVUDine (DOVATO) 50-300 MG tablet    Sig: Take 1 tablet by mouth daily.    Dispense:  30 tablet    Refill:  4    Please ship to [facility]    Supervising Provider:   SNIDER, CYNTHIA [phone]    Prescription Type::   Renewal    Change Pharmacy::   [facility] [[id number]]     Follow-up: Return in about 2 months (around 05/14/2024). or sooner if needed.    Marlan Silva, MSN, FNP-C Nurse Practitioner [facility] for Infectious Disease [facility] Main number: [phone]

## 2024-03-14 NOTE — Progress Notes (Signed)
 Specialty Pharmacy Initial Fill Coordination Note  Marcus Nichols is a 58 y.o. male contacted today regarding initial fill of specialty medication(s) Dolutegravir-lamiVUDine (DOVATO)   Patient requested Courier to Provider Office   Delivery date: 03/18/24   Verified address: [facility] [address] [place] [address]   Medication will be filled on 03/15/24.   Patient is aware of $0 copayment.

## 2024-03-15 ENCOUNTER — Other Ambulatory Visit: Payer: Self-pay

## 2024-03-15 LAB — T-HELPER CELL (CD4) - (RCID CLINIC ONLY)
CD4 % Helper T Cell: 27 % — ABNORMAL LOW (ref 33–65)
CD4 T Cell Abs: 381 /uL — ABNORMAL LOW (ref 400–1790)

## 2024-03-16 ENCOUNTER — Other Ambulatory Visit: Payer: Self-pay

## 2024-03-16 ENCOUNTER — Encounter (HOSPITAL_COMMUNITY): Payer: Self-pay | Admitting: *Deleted

## 2024-03-16 ENCOUNTER — Emergency Department (HOSPITAL_COMMUNITY): Payer: MEDICAID

## 2024-03-16 ENCOUNTER — Emergency Department (HOSPITAL_COMMUNITY)
Admission: EM | Admit: 2024-03-16 | Discharge: 2024-03-16 | Disposition: A | Discharge status: Home / Self Care | Payer: MEDICAID | Attending: Emergency Medicine | Admitting: Emergency Medicine

## 2024-03-16 DIAGNOSIS — S0093XA Contusion of unspecified part of head, initial encounter: Secondary | ICD-10-CM | POA: Diagnosis not present

## 2024-03-16 DIAGNOSIS — S060XAA Concussion with loss of consciousness status unknown, initial encounter: Secondary | ICD-10-CM | POA: Insufficient documentation

## 2024-03-16 DIAGNOSIS — S0990XA Unspecified injury of head, initial encounter: Secondary | ICD-10-CM | POA: Diagnosis present
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
## 2024-03-17 LAB — BASIC METABOLIC PANEL WITH GFR
BUN: 9 mg/dL (ref 7–25)
CO2: 29 mmol/L (ref 20–32)
Calcium: 9.6 mg/dL (ref 8.6–10.3)
Chloride: 104 mmol/L (ref 98–110)
Creat: 0.81 mg/dL (ref 0.70–1.30)
Glucose, Bld: 101 mg/dL — ABNORMAL HIGH (ref 65–99)
Potassium: 4.7 mmol/L (ref 3.5–5.3)
Sodium: 140 mmol/L (ref 135–146)
eGFR: 102 mL/min/{1.73_m2} (ref >=60)

## 2024-03-17 LAB — HIV-1 RNA QUANT-NO REFLEX-BLD
HIV 1 RNA Quant: 42600 {copies}/mL — ABNORMAL HIGH
HIV-1 RNA Quant, Log: 4.63 {Log_copies}/mL — ABNORMAL HIGH

## 2024-03-18 ENCOUNTER — Telehealth: Payer: Self-pay

## 2024-03-18 NOTE — Telephone Encounter (Signed)
 [facility] Patient Advocate Encounter  Patient's medications Dovato  have been couriered to [facility] from [facility] and will be picked up  03/19/24.  Roylene Corn, CPhT Specialty Pharmacy Patient [facility] Phone: [phone] Fax:  [phone]

## 2024-03-19 ENCOUNTER — Telehealth: Payer: Self-pay

## 2024-03-19 NOTE — Telephone Encounter (Signed)
 Patient aware of results. Informed patient to take Dovato  every day as prescribed. Patient voiced his understanding.    Marcus Nichols Chestnut, CMA

## 2024-03-19 NOTE — Telephone Encounter (Signed)
-----   Message from Annamaria Barrette sent at 03/19/2024  4:05 PM EDT ----- Please inform Mr. Bangs that his lab work does not look too good. Viral load is 42,600 and CD4 count is okay at 381. Please have him follow up in a month to ensure he is back to undetectable. Thanks!

## 2024-03-20 ENCOUNTER — Other Ambulatory Visit: Payer: Self-pay | Admitting: Pharmacist

## 2024-03-20 DIAGNOSIS — Z21 Asymptomatic human immunodeficiency virus [HIV] infection status: Secondary | ICD-10-CM

## 2024-03-20 MED ORDER — DOVATO 50-300 MG PO TABS: 1.0000 | ORAL_TABLET | Freq: Every day | ORAL | Status: AC

## 2024-03-20 NOTE — Progress Notes (Signed)
 Medication Samples have been provided to the patient.  Drug name: Dovato         Strength: 50/300 mg         Qty: 1 bottle (14 tablets)   LOT: [id number]   Exp.Date: 05/2025  Samples requested by Marlan Silva, NP.  Dosing instructions: Take one tablet by mouth once daily  The patient has been instructed regarding the correct time, dose, and frequency of taking this medication, including desired effects and most common side effects.   Anber Mckiver L. Margart Shears, PharmD, BCIDP, AAHIVP, CPP Clinical Pharmacist Practitioner Infectious Diseases Clinical Pharmacist [facility] 11/09/2020, 10:07 AM

## 2024-04-02 ENCOUNTER — Other Ambulatory Visit: Payer: Self-pay

## 2024-04-02 ENCOUNTER — Telehealth: Payer: Self-pay

## 2024-04-02 ENCOUNTER — Other Ambulatory Visit (HOSPITAL_COMMUNITY): Payer: Self-pay

## 2024-04-02 NOTE — Telephone Encounter (Signed)
 Medication was picked up at [facility] on  04/02/24 @ 2pm

## 2024-04-02 NOTE — Progress Notes (Signed)
 Specialty Pharmacy Refill Coordination Note  Marcus Nichols is a 58 y.o. male contacted today regarding refills of specialty medication(s) Dolutegravir -lamiVUDine  (DOVATO )   Patient requested Courier to Provider Office   Delivery date: 04/10/24   Verified address: [facility] [address] [place] [address]   Medication will be filled on 5/12//25.

## 2024-04-03 ENCOUNTER — Other Ambulatory Visit: Payer: Self-pay

## 2024-04-03 ENCOUNTER — Other Ambulatory Visit (HOSPITAL_COMMUNITY): Payer: Self-pay

## 2024-04-03 MED ORDER — TRIAMCINOLONE ACETONIDE 0.025 % EX OINT
1.0000 | TOPICAL_OINTMENT | Freq: Two times a day (BID) | CUTANEOUS | 0 refills | Status: DC
  Filled 2024-04-03: qty 80, 30d supply, fill #0

## 2024-04-04 ENCOUNTER — Other Ambulatory Visit: Payer: Self-pay

## 2024-04-09 ENCOUNTER — Telehealth: Payer: Self-pay

## 2024-04-09 NOTE — Telephone Encounter (Signed)
 [facility] Patient Advocate Encounter  Patient's medications DOVATO have been couriered to [facility] from [facility] pharmacy and will be picked up at  [facility].  Kae Heller, CPhT Specialty Pharmacy Patient [facility] Phone: [phone] Fax:  [phone]

## 2024-04-12 ENCOUNTER — Other Ambulatory Visit: Payer: Self-pay

## 2024-04-12 ENCOUNTER — Other Ambulatory Visit (HOSPITAL_COMMUNITY): Payer: Self-pay

## 2024-04-14 NOTE — Progress Notes (Deleted)
   S:    PCP: Dr. Lincoln Renshaw   Patient was referred by Primary Care Provider, Dr. Lincoln Renshaw, on 12/05/2023, and last seen on 01/11/24. 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 At last appt with Dr. Lincoln Renshaw, BP was controlled at 126/70 mmHg. No medication changes were made.  At last pharmacy visit, BP was 129/76. He was willing to start monitoring his BP at home, so we sent in a prescription for a BP cuff for him to obtain through his Medicaid insurance.   Patient arrives in good spirits. *** Tobacco cessation Issues with Dovato  adherence - viral load 45 > 42,600 -  Amlodipine  last filled 03/15/24 for 30 ds - ask why not getting 90 ds?  Current BP Medications include:   -Amlodipine  10 mg daily - planning to refill today  Antihypertensives tried in the past include:  -hydrochlorothiazide   -losartan   Dietary habits include: tries to comply with sodium restriction. Denies excessive intake of caffeine.  Exercise habits include: none  Family / Social history:  -Mother: HTN, CHF -Current smoker, attempting to quit. - had 2 cigars today (30-40 min ago) -Denies other illicit substance use  ASCVD risk factors include: HTN, stroke, smoking  O:  There were no vitals filed for this visit.   Home BP readings: none. Patient reported willingness to begin measuring blood pressure at home.  Last 3 Office BP readings: BP Readings from Last 3 Encounters:  03/16/24 (!) 140/75  03/14/24 123/80  02/05/24 129/67    BMET    Component Value Date/Time   NA 140 03/14/2024 0134   NA 142 12/14/2021 1439   K 4.7 03/14/2024 0134   CL 104 03/14/2024 0134   CO2 29 03/14/2024 0134   GLUCOSE 101 (H) 03/14/2024 0134   BUN 9 03/14/2024 0134   BUN 11 12/14/2021 1439   CREATININE 0.81 03/14/2024 0134   CALCIUM  9.6 03/14/2024 0134   GFRNONAA >60 12/11/2023 1454   GFRAA 54 (L) 03/28/2020 1855   Lipid  Panel     Component Value Date/Time   CHOL 118 12/21/2023 1335   TRIG 191 (H) 12/21/2023 1335   HDL 43 12/21/2023 1335   CHOLHDL 2.7 12/21/2023 1335   VLDL 20 05/05/2021 0330   LDLCALC 49 12/21/2023 1335    Renal function: CrCl cannot be calculated (Patient's most recent lab result is older than the maximum 21 days allowed.).  Clinical ASCVD: Yes  The ASCVD Risk score (Arnett DK, et al., 2019) failed to calculate for the following reasons:   Risk score cannot be calculated because patient has a medical history suggesting prior/existing ASCVD  A/P: Hypertension longstanding, currently at goal on current medications. BP Goal = < 130/80 mmHg. Discussed that tobacco cessation will improve risk of recurrent ACS. Medication adherence appears optimal at this time. Will order home BP cuff, which is covered by his Medicaid plan.  -Continue amlodipine  10 mg daily -Recommend monitoring BP at home at least a couple times per week. Discussed appropriate home monitoring technique. Will order supplies.  -Counseled on lifestyle modifications for blood pressure control including reduced dietary sodium, increased exercise, adequate sleep, and tobacco cessation  Total time in face-to-face counseling 25 minutes.   Follow-up PCP: 03/04/24 Pharmacist: 04/15/24  Arthea Larsson, PharmD PGY1 Pharmacy Resident  Marene Shape, PharmD, BCACP, CPP Clinical Pharmacist [facility] [phone]

## 2024-04-14 NOTE — Progress Notes (Deleted)
   S:    PCP: Dr. Lincoln Renshaw    Last visit with pharmacist 02/05/2024 and their in office BP was 129/67 mmHg. Patient reported being adherent to their medication and requested home BP cuff. Discussed benefits of smoking cessation to reduce risk of ACS.   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   Today,   In office BP reading: ***  Received BP cuff and measuring BP at home: *** Home readings: ***  Current BP Medications include:   -Amlodipine  10 mg daily - planning to refill today  Antihypertensives tried in the past include:  -hydrochlorothiazide   -losartan   Dietary habits include: tries to comply with sodium restriction. Denies excessive intake of caffeine.  Exercise habits include: none  Family / Social history:  -Mother: HTN, CHF -Current smoker, attempting to quit. - had 2 cigars today (30-40 min ago) -Denies other illicit substance use  ASCVD risk factors include: HTN, stroke, smoking  O:   Last 3 Office BP readings: BP Readings from Last 3 Encounters:  03/16/24 (!) 140/75  03/14/24 123/80  02/05/24 129/67    BMET    Component Value Date/Time   NA 140 03/14/2024 0134   NA 142 12/14/2021 1439   K 4.7 03/14/2024 0134   CL 104 03/14/2024 0134   CO2 29 03/14/2024 0134   GLUCOSE 101 (H) 03/14/2024 0134   BUN 9 03/14/2024 0134   BUN 11 12/14/2021 1439   CREATININE 0.81 03/14/2024 0134   CALCIUM  9.6 03/14/2024 0134   GFRNONAA >60 12/11/2023 1454   GFRAA 54 (L) 03/28/2020 1855   Lipid Panel     Component Value Date/Time   CHOL 118 12/21/2023 1335   TRIG 191 (H) 12/21/2023 1335   HDL 43 12/21/2023 1335   CHOLHDL 2.7 12/21/2023 1335   VLDL 20 05/05/2021 0330   LDLCALC 49 12/21/2023 1335    Renal function: CrCl cannot be calculated (Patient's most recent lab result is older than the maximum 21 days allowed.).  Clinical ASCVD: Yes  The ASCVD  Risk score (Arnett DK, et al., 2019) failed to calculate for the following reasons:   Risk score cannot be calculated because patient has a medical history suggesting prior/existing ASCVD  A/P: Hypertension longstanding, ***currently at goal on current medications. BP Goal = < 130/80 mmHg. Discussed that tobacco cessation will improve risk of recurrent ACS. Medication adherence appears optimal at this time.   -Continue amlodipine  10 mg daily - continue to monitor BP readings at home.  -Counseled on lifestyle modifications for blood pressure control including reduced dietary sodium, increased exercise, adequate sleep, and tobacco cessation  Total time in face-to-face counseling 30  minutes.   Follow-up PCP:  Pharmacist:   Georges Kings, M.S. PharmD Candidate Class of 2026 [employer]   ***

## 2024-04-15 ENCOUNTER — Ambulatory Visit: Payer: MEDICAID | Admitting: Pharmacist

## 2024-04-15 ENCOUNTER — Telehealth: Payer: Self-pay | Admitting: Internal Medicine

## 2024-04-15 NOTE — Telephone Encounter (Addendum)
 Called & spoke to the patient. Verified name & DOB. Patient appointment rescheduled.   Copied from CRM [id number]. Topic: Appointments - Scheduling Inquiry for Clinic >> Apr 15, 2024 10:06 AM Juluis Ok wrote:  Reason for CRM: Patient states that he is unable to attend his appointment today at 1030, due to PT. He states that he can come in today anytime after 130 pm. Please contact patient to reschedule appointment.

## 2024-04-18 ENCOUNTER — Ambulatory Visit: Payer: MEDICAID | Admitting: Internal Medicine

## 2024-04-18 ENCOUNTER — Other Ambulatory Visit: Payer: Self-pay

## 2024-04-18 ENCOUNTER — Ambulatory Visit: Payer: MEDICAID | Admitting: Family

## 2024-04-18 ENCOUNTER — Other Ambulatory Visit (HOSPITAL_COMMUNITY): Payer: Self-pay

## 2024-04-19 ENCOUNTER — Other Ambulatory Visit: Payer: Self-pay

## 2024-04-24 ENCOUNTER — Other Ambulatory Visit: Payer: Self-pay

## 2024-04-29 ENCOUNTER — Telehealth: Payer: Self-pay

## 2024-04-29 ENCOUNTER — Other Ambulatory Visit: Payer: Self-pay

## 2024-04-29 MED ORDER — PREDNISONE 20 MG PO TABS
20.0000 mg | ORAL_TABLET | Freq: Every day | ORAL | 0 refills | Status: DC
Start: 2024-04-29 — End: 2024-06-04
  Filled 2024-04-29: qty 5, 5d supply, fill #0

## 2024-04-29 NOTE — Addendum Note (Signed)
 Addended by: Ravan Schlemmer D on: 04/29/2024 09:45 AM   Modules accepted: Orders

## 2024-04-29 NOTE — Telephone Encounter (Signed)
 Patient came in requesting medication refill for Prednisone  be sent to [facility] or if possible be sent to our office to be picked up here with other medications. Best contact  number is [phone].

## 2024-04-29 NOTE — Telephone Encounter (Signed)
 Medication has been sent.

## 2024-04-30 ENCOUNTER — Other Ambulatory Visit: Payer: Self-pay

## 2024-05-07 ENCOUNTER — Other Ambulatory Visit (HOSPITAL_COMMUNITY): Payer: Self-pay

## 2024-05-07 ENCOUNTER — Other Ambulatory Visit: Payer: Self-pay

## 2024-05-07 NOTE — Progress Notes (Signed)
 Specialty Pharmacy Refill Coordination Note  Marcus Nichols is a 58 y.o. male contacted today regarding refills of specialty medication(s) Dolutegravir -lamiVUDine  (DOVATO )   Patient requested Delivery   Delivery date: 05/09/24   Verified address: [facility] [address] [place] [address]   Medication will be filled on 05/08/24.

## 2024-05-08 ENCOUNTER — Other Ambulatory Visit: Payer: Self-pay

## 2024-05-09 ENCOUNTER — Telehealth: Payer: Self-pay

## 2024-05-09 NOTE — Telephone Encounter (Signed)
 [facility] Patient Advocate Encounter  Patient's medications DOVATO have been couriered to [facility] from [facility] pharmacy and will be picked up at  [facility].  Kae Heller, CPhT Specialty Pharmacy Patient [facility] Phone: [phone] Fax:  [phone]

## 2024-05-14 ENCOUNTER — Ambulatory Visit: Payer: MEDICAID | Admitting: Family

## 2024-05-15 NOTE — Progress Notes (Unsigned)
   S:    PCP: Dr. Lincoln Renshaw   Patient was referred by Primary Care Provider, Dr. Lincoln Renshaw, on 12/05/2023, and last seen on 01/11/24. 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 At last appt with pharmacy, BP was controlled at 129/67 and no medication changes were made.  Patient arrives in good spirits. Presents to the clinic for follow-up hypertension evaluation, counseling, and management. Patient reports that he has been able to stay adherent to medications.*** He was able to obtain a BP cuff via mail-order and reports using it ***x/week. Provides updated address: [address] [place], [address]. ***  Current BP Medications include:   -Amlodipine  10 mg daily   Antihypertensives tried in the past include:  -hydrochlorothiazide   -losartan   Dietary habits include: tries to comply with sodium restriction. Denies excessive intake of caffeine.  Exercise habits include: none  Family / Social history:  -Mother: HTN, CHF -Current smoker, attempting to quit. - had 2 cigars today (30-40 min ago) -Denies other illicit substance use  ASCVD risk factors include: HTN, stroke, smoking  O:  There were no vitals filed for this visit.  Home BP readings: ***  Last 3 Office BP readings: BP Readings from Last 3 Encounters:  03/16/24 (!) 140/75  03/14/24 123/80  02/05/24 129/67    BMET    Component Value Date/Time   NA 140 03/14/2024 0134   NA 142 12/14/2021 1439   K 4.7 03/14/2024 0134   CL 104 03/14/2024 0134   CO2 29 03/14/2024 0134   GLUCOSE 101 (H) 03/14/2024 0134   BUN 9 03/14/2024 0134   BUN 11 12/14/2021 1439   CREATININE 0.81 03/14/2024 0134   CALCIUM  9.6 03/14/2024 0134   GFRNONAA >60 12/11/2023 1454   GFRAA 54 (L) 03/28/2020 1855   Lipid Panel     Component Value Date/Time   CHOL 118 12/21/2023 1335   TRIG 191 (H) 12/21/2023 1335   HDL 43 12/21/2023 1335   CHOLHDL  2.7 12/21/2023 1335   VLDL 20 05/05/2021 0330   LDLCALC 49 12/21/2023 1335    Renal function: CrCl cannot be calculated (Patient's most recent lab result is older than the maximum 21 days allowed.).  Clinical ASCVD: Yes  The ASCVD Risk score (Arnett DK, et al., 2019) failed to calculate for the following reasons:   Risk score cannot be calculated because patient has a medical history suggesting prior/existing ASCVD  A/P: Hypertension longstanding, currently at goal ***on current medications. BP goal < 130/80 mmHg. Discussed that tobacco cessation will improve risk of recurrent ACS. Medication adherence appears optimal at this time. ***  -Continue amlodipine  10 mg daily -Recommend monitoring BP at home at least a couple times per week. Discussed appropriate home monitoring technique. -Counseled on lifestyle modifications for blood pressure control including reduced dietary sodium, increased exercise, adequate sleep, and tobacco cessation  Total time in face-to-face counseling *** minutes.   Follow-up PCP: not scheduled Pharmacist: ***  Juleen Oakland, PharmD PGY1 Pharmacy Resident

## 2024-05-16 ENCOUNTER — Ambulatory Visit: Payer: MEDICAID | Admitting: Pharmacist

## 2024-05-23 ENCOUNTER — Telehealth: Payer: Self-pay

## 2024-05-23 DIAGNOSIS — Z21 Asymptomatic human immunodeficiency virus [HIV] infection status: Secondary | ICD-10-CM

## 2024-05-23 NOTE — Telephone Encounter (Signed)
 Detectable Viral Load Intervention (DVL)  Most recent VL:  HIV 1 RNA Quant  Date Value Ref Range Status  03/14/2024 42,600 (H) NOT DETECTED copies/mL Final  01/18/2024 45 (H) Copies/mL Final  12/21/2023 43,900 (H) copies/mL Final    Last Clinic Visit:   Current ART regimen: Dovato   Appointment status: patient has future appointment scheduled  Medication last dispensed (per chart review):   Medication Adherence   What pharmacy do you use for your ART? [facility]  Do you pick up your medication at the pharmacy or is it mailed to you? other  How often do you miss a dose your ART?   Are you experiencing any side effects with your ART? no  Are you having any trouble remembering what medication(s) you are supposed to take or how you are supposed to take them? no  What helps you remember to take your medication(s)? no   Barriers to Care   Lack of transportation to medical appointments? Somewhat/ bus   2. Housing instability? Yes   3. If you are currently employed, are you having difficulty taking time off of work for medical appointments? No   4. Financial concerns (rent, utilities, etc.) yes  5. Lack of consistent access to food? Food stamps-   6. Trouble remembering and attending your appointments? Yes   7. Are you experiencing any other barriers that make it hard for you to come to appointments or take medication regularly?    Interventions   Called patient to discuss medication adherence and possible barriers to care. Spoke with patient regarding compliance. States he is doing better with taking ART. Has multiple barriers to care; will refer to [facility] for assistance. Lorenda CHRISTELLA Code, RMA

## 2024-05-27 ENCOUNTER — Other Ambulatory Visit: Payer: Self-pay

## 2024-05-30 ENCOUNTER — Other Ambulatory Visit: Payer: Self-pay

## 2024-06-04 ENCOUNTER — Other Ambulatory Visit: Payer: Self-pay

## 2024-06-04 ENCOUNTER — Other Ambulatory Visit: Payer: Self-pay | Admitting: Internal Medicine

## 2024-06-04 ENCOUNTER — Telehealth: Payer: Self-pay

## 2024-06-04 MED ORDER — PREDNISONE 20 MG PO TABS
20.0000 mg | ORAL_TABLET | Freq: Every day | ORAL | 0 refills | Status: AC
  Filled 2024-06-04: qty 5, 5d supply, fill #0

## 2024-06-04 NOTE — Telephone Encounter (Signed)
 Notified by front desk that patient came in asking for a refill of prednisone , reports he has another break out. Per Cathlyn July, NP, okay to refill.   Daviel Allegretto, BSN, RN

## 2024-06-05 ENCOUNTER — Other Ambulatory Visit: Payer: Self-pay

## 2024-06-05 MED ORDER — TRIAMCINOLONE ACETONIDE 0.025 % EX OINT
1.0000 | TOPICAL_OINTMENT | Freq: Two times a day (BID) | CUTANEOUS | 1 refills | Status: AC
Start: 2024-06-05 — End: ?
  Filled 2024-06-05: qty 80, 34d supply, fill #0

## 2024-06-06 ENCOUNTER — Other Ambulatory Visit: Payer: Self-pay | Admitting: Internal Medicine

## 2024-06-06 ENCOUNTER — Other Ambulatory Visit: Payer: Self-pay

## 2024-06-06 MED ORDER — ATORVASTATIN CALCIUM 20 MG PO TABS
20.0000 mg | ORAL_TABLET | Freq: Every day | ORAL | 0 refills | Status: AC
  Filled 2024-06-06 – 2024-10-04 (×2): qty 90, 90d supply, fill #0

## 2024-06-10 ENCOUNTER — Ambulatory Visit: Payer: MEDICAID | Admitting: Family

## 2024-06-12 ENCOUNTER — Telehealth: Payer: Self-pay

## 2024-06-12 NOTE — Telephone Encounter (Signed)
 Detectable Viral Load Intervention (DVL)  Most recent VL:  HIV 1 RNA Quant  Date Value Ref Range Status  03/14/2024 42,600 (H) NOT DETECTED copies/mL Final  01/18/2024 45 (H) Copies/mL Final  12/21/2023 43,900 (H) copies/mL Final    Last Clinic Visit: 03/14/24   Current ART regimen: Dovato   Appointment status: patient does not have future appointment scheduled   Medication last dispensed (per chart review):   Medication Adherence   Not able to assess    Barriers to Care  Not able to assess   Interventions   Called patient to discuss medication adherence and possible barriers to care. Not able to reach at this time, left vm. Lorenda CHRISTELLA Code, RMA

## 2024-06-13 ENCOUNTER — Other Ambulatory Visit (HOSPITAL_COMMUNITY): Payer: Self-pay

## 2024-06-13 ENCOUNTER — Other Ambulatory Visit: Payer: Self-pay

## 2024-06-13 NOTE — Progress Notes (Signed)
 Specialty Pharmacy Refill Coordination Note  Marcus Nichols is a 58 y.o. male assessed today regarding refills of clinic administered specialty medication(s) Dolutegravir -lamiVUDine  (DOVATO )   Clinic requested Courier to Provider Office   Delivery date: 06/24/24   Verified address: [address] [place] [address]   Medication will be filled on 06/21/24.

## 2024-06-14 ENCOUNTER — Ambulatory Visit: Payer: MEDICAID | Admitting: Pharmacist

## 2024-06-17 ENCOUNTER — Other Ambulatory Visit: Payer: Self-pay

## 2024-06-21 ENCOUNTER — Other Ambulatory Visit: Payer: Self-pay

## 2024-06-22 ENCOUNTER — Other Ambulatory Visit: Payer: Self-pay

## 2024-06-22 ENCOUNTER — Encounter (HOSPITAL_COMMUNITY): Payer: Self-pay | Admitting: *Deleted

## 2024-06-22 ENCOUNTER — Emergency Department (HOSPITAL_COMMUNITY)
Admission: EM | Admit: 2024-06-22 | Discharge: 2024-06-22 | Disposition: A | Discharge status: Home / Self Care | Payer: MEDICAID | Attending: Emergency Medicine | Admitting: Emergency Medicine

## 2024-06-22 DIAGNOSIS — Z5900 Homelessness unspecified: Secondary | ICD-10-CM | POA: Diagnosis not present

## 2024-06-22 DIAGNOSIS — B86 Scabies: Secondary | ICD-10-CM | POA: Insufficient documentation

## 2024-06-22 DIAGNOSIS — Z79899 Other long term (current) drug therapy: Secondary | ICD-10-CM | POA: Insufficient documentation

## 2024-06-22 DIAGNOSIS — Z7982 Long term (current) use of aspirin: Secondary | ICD-10-CM | POA: Insufficient documentation

## 2024-06-22 DIAGNOSIS — R21 Rash and other nonspecific skin eruption: Secondary | ICD-10-CM | POA: Diagnosis present

## 2024-06-22 HISTORY — DX: Essential (primary) hypertension: I10

## 2024-06-22 MED ORDER — DIPHENHYDRAMINE HCL 25 MG PO CAPS
25.0000 mg | ORAL_CAPSULE | Freq: Once | ORAL | Status: AC
  Administered 2024-06-22: 25 mg via ORAL
  Filled 2024-06-22: qty 1

## 2024-06-22 MED ORDER — PERMETHRIN 5 % EX CREA
TOPICAL_CREAM | CUTANEOUS | 0 refills | Status: AC
  Filled 2024-06-22: qty 60, fill #0

## 2024-06-22 MED ORDER — PERMETHRIN 5 % EX CREA
TOPICAL_CREAM | Freq: Once | CUTANEOUS | Status: DC
  Filled 2024-06-22 (×2): qty 60

## 2024-06-22 MED ORDER — DEXAMETHASONE SODIUM PHOSPHATE 10 MG/ML IJ SOLN
10.0000 mg | Freq: Once | INTRAMUSCULAR | Status: AC
  Administered 2024-06-22: 10 mg via INTRAMUSCULAR
  Filled 2024-06-22: qty 1

## 2024-06-22 MED ORDER — PERMETHRIN 5 % EX CREA
TOPICAL_CREAM | Freq: Once | CUTANEOUS | Status: AC
  Filled 2024-06-22: qty 60

## 2024-06-22 NOTE — ED Provider Notes (Signed)
 [name] EMERGENCY DEPARTMENT AT [facility] Provider Note   CSN: [id number] Arrival date & time: 06/22/24  1640     Patient presents with: Rash   Marcus Nichols is a 58 y.o. male here with itchy bumpy rash on arms and legs for a few days.  Reports he is homeless.   HPI     Prior to Admission medications   Medication Sig Start Date End Date Taking? Authorizing Provider  amLODipine  (NORVASC ) 10 MG tablet Take 1 tablet (10 mg total) by mouth daily. 12/05/23   Vicci Barnie NOVAK, MD  aspirin  EC 81 MG tablet Take 1 tablet (81 mg total) by mouth daily. Swallow whole. 12/05/23   Vicci Barnie NOVAK, MD  atorvastatin  (LIPITOR ) 20 MG tablet Take 1 tablet (20 mg total) by mouth daily. 06/06/24   Vicci Barnie NOVAK, MD  Blood Pressure Monitoring (BLOOD PRESSURE CUFF) MISC Use as directed to monitor blood pressure 02/05/24   Vicci Barnie NOVAK, MD  dolutegravir -lamiVUDine  (DOVATO ) 50-300 MG tablet Take 1 tablet by mouth daily. 03/14/24   Calone, Gregory D, FNP  predniSONE  (DELTASONE ) 20 MG tablet Take 1 tablet (20 mg total) by mouth daily with breakfast. 06/04/24   Calone, Gregory D, FNP  risperiDONE  (RISPERDAL ) 0.5 MG tablet Take 1 tablet (0.5 mg total) by mouth at bedtime. Patient not taking: Reported on 01/18/2024 01/04/24 03/04/24  Mercy Domino A  triamcinolone  (KENALOG ) 0.025 % ointment Apply 1 Application topically 2 (two) times daily. DO NOT APPLY ONTO FACE 06/05/24   Vicci Barnie NOVAK, MD  Vitamin D , Ergocalciferol , (DRISDOL ) 1.25 MG (50000 UNIT) CAPS capsule Take 1 capsule (50,000 Units total) by mouth every 7 (seven) days. 01/06/24   Vicci Barnie NOVAK, MD  gabapentin  (NEURONTIN ) 300 MG capsule Take 2 capsules (600 mg total) by mouth 2 (two) times daily. Patient not taking: Reported on 03/28/2020 03/20/19 12/07/20  Rai, Nydia POUR, MD    Allergies: Grass pollen([id number]-o-r-t-swt vern)    Review of Systems  Updated Vital Signs BP (!) 168/110   Pulse 84   Temp 98.3 F (36.8 C)   Resp 18    Ht 6' 2 (1.88 m)   Wt 86.7 kg   SpO2 94%   BMI 24.54 kg/m   Physical Exam Constitutional:      General: He is not in acute distress. HENT:     Head: Normocephalic and atraumatic.  Eyes:     Conjunctiva/sclera: Conjunctivae normal.     Pupils: Pupils are equal, round, and reactive to light.  Cardiovascular:     Rate and Rhythm: Normal rate and regular rhythm.  Pulmonary:     Effort: Pulmonary effort is normal. No respiratory distress.  Abdominal:     General: There is no distension.     Tenderness: There is no abdominal tenderness.  Skin:    General: Skin is warm and dry.     Comments: Burrowed linear lesions on bilateral forearms and legs, sparing the back, chest and face  Neurological:     General: No focal deficit present.     Mental Status: He is alert. Mental status is at baseline.  Psychiatric:        Mood and Affect: Mood normal.        Behavior: Behavior normal.     (all labs ordered are listed, but only abnormal results are displayed) Labs Reviewed - No data to display  EKG: None  Radiology: No results found.   Procedures   Medications Ordered in the ED  permethrin  (ELIMITE ) 5 % cream (has no administration in time range)  diphenhydrAMINE  (BENADRYL ) capsule 25 mg (has no administration in time range)  dexamethasone  (DECADRON ) injection 10 mg (has no administration in time range)  permethrin  (ELIMITE ) 5 % cream (has no administration in time range)                                    Medical Decision Making Risk Prescription drug management.   This looks consistent with scabies.  We'll have him shower, quarantine his belongings in a plastic bag, apply permethrin  now and give him another round to apply in 1 week.  IM decadron  and oral benadryl  ordered for itching     Final diagnoses:  Scabies    ED Discharge Orders     None          Cottie Donnice PARAS, MD 06/22/24 [phone]

## 2024-06-22 NOTE — ED Notes (Signed)
 Patient advised to return in 1 week for repeat treatment per EDP order.

## 2024-06-22 NOTE — ED Triage Notes (Signed)
 Pt to ED c/o generalized rash to arms and legs x 1 day

## 2024-06-22 NOTE — Discharge Instructions (Addendum)
 You should apply the cream from your neck down over your entire body.  Let it sit 12 hours overnight and shower it off tomorrow.  In 1 week, repeat this cycle.  Apply the cream all over, let it sit 12 hours, and rinse it off the next day.  *  You should come to the [facility] on Monday to pick up your prescription for the cream.

## 2024-06-24 ENCOUNTER — Telehealth: Payer: Self-pay

## 2024-06-24 ENCOUNTER — Other Ambulatory Visit (HOSPITAL_COMMUNITY): Payer: Self-pay

## 2024-06-24 NOTE — Telephone Encounter (Signed)
 [facility] Patient Advocate Encounter  Patient's medications Dovato  have been couriered to [facility] from [facility] and will be  picked up at the patients appointment on 07/04/24.  Arland Hutchinson, CPhT Specialty Pharmacy Patient [facility] Phone: [phone] Fax:  [phone]

## 2024-06-27 ENCOUNTER — Other Ambulatory Visit: Payer: Self-pay

## 2024-07-02 ENCOUNTER — Other Ambulatory Visit: Payer: Self-pay

## 2024-07-04 ENCOUNTER — Other Ambulatory Visit (HOSPITAL_COMMUNITY): Payer: Self-pay

## 2024-07-04 ENCOUNTER — Other Ambulatory Visit: Payer: Self-pay

## 2024-07-04 ENCOUNTER — Ambulatory Visit: Payer: MEDICAID | Admitting: Family

## 2024-07-08 ENCOUNTER — Other Ambulatory Visit: Payer: Self-pay

## 2024-07-10 ENCOUNTER — Other Ambulatory Visit (HOSPITAL_COMMUNITY): Payer: Self-pay

## 2024-07-10 ENCOUNTER — Other Ambulatory Visit: Payer: Self-pay

## 2024-07-15 ENCOUNTER — Other Ambulatory Visit: Payer: Self-pay

## 2024-07-15 NOTE — Progress Notes (Signed)
 Specialty Pharmacy Ongoing Clinical Assessment Note  [name] is a 58 y.o. male who is being followed by the specialty pharmacy service for RxSp HIV   Patient's specialty medication(s) reviewed today: Dolutegravir -lamiVUDine  (DOVATO )   Missed doses in the last 4 weeks: 1   Patient/Caregiver did not have any additional questions or concerns.   Therapeutic benefit summary: Unable to assess (last viral load was 42,600 copies/mL, but was from 03/14/24, patient reports increased compliance, but we have no recent lab values.)   Adverse events/side effects summary: No adverse events/side effects   Patient's therapy is appropriate to: Continue    Goals Addressed             This Visit's Progress    Achieve Undetectable HIV Viral Load < 20   Worsening    Patient is not on track and worsening. Patient will work on increased adherence.  The last viral load was 42,600 copies/mL from 03/14/24, patient reports increased compliance, but we have no recent lab values.          Follow up: 3 months  Silvano LOISE Dolly Specialty Pharmacist

## 2024-07-17 ENCOUNTER — Other Ambulatory Visit: Payer: Self-pay | Admitting: Pharmacist

## 2024-07-17 DIAGNOSIS — Z21 Asymptomatic human immunodeficiency virus [HIV] infection status: Secondary | ICD-10-CM

## 2024-07-17 MED ORDER — DOVATO 50-300 MG PO TABS: 1.0000 | ORAL_TABLET | Freq: Every day | ORAL | Status: AC

## 2024-07-17 NOTE — Progress Notes (Signed)
 Medication Samples have been provided to the patient.  Drug name: Dovato         Strength: 50/300 mg         Qty: 1 bottle (14 tablets)   LOT: [id number]   Exp.Date: 07/3025  Samples requested by Charlott Flowers, PharmD.  Dosing instructions: Take one tablet by mouth once daily  The patient has been instructed regarding the correct time, dose, and frequency of taking this medication, including desired effects and most common side effects.   Alvira Hecht L. Fiore Detjen, PharmD, BCIDP, AAHIVP, CPP Clinical Pharmacist Practitioner Infectious Diseases Clinical Pharmacist [facility]

## 2024-07-26 ENCOUNTER — Telehealth: Payer: Self-pay | Admitting: Internal Medicine

## 2024-07-26 NOTE — Telephone Encounter (Signed)
 Called patient to confirm upcoming appointment 07/30/2024, no answer. Unable to leave VM.

## 2024-07-30 ENCOUNTER — Ambulatory Visit: Payer: MEDICAID | Admitting: Internal Medicine

## 2024-08-08 ENCOUNTER — Encounter: Payer: Self-pay | Admitting: Dermatology

## 2024-08-08 ENCOUNTER — Ambulatory Visit: Payer: MEDICAID | Admitting: Family

## 2024-08-08 ENCOUNTER — Ambulatory Visit (INDEPENDENT_AMBULATORY_CARE_PROVIDER_SITE_OTHER): Payer: MEDICAID | Admitting: Dermatology

## 2024-08-08 ENCOUNTER — Other Ambulatory Visit: Payer: Self-pay

## 2024-08-08 VITALS — BP 179/100 | HR 70

## 2024-08-08 DIAGNOSIS — L299 Pruritus, unspecified: Secondary | ICD-10-CM

## 2024-08-08 DIAGNOSIS — L209 Atopic dermatitis, unspecified: Secondary | ICD-10-CM

## 2024-08-08 MED ORDER — CLOBETASOL PROPIONATE 0.05 % EX OINT
1.0000 | TOPICAL_OINTMENT | Freq: Two times a day (BID) | CUTANEOUS | 3 refills | Status: DC
  Filled 2024-08-08: qty 60, 30d supply, fill #0

## 2024-08-08 MED ORDER — TACROLIMUS 0.1 % EX OINT
TOPICAL_OINTMENT | Freq: Every day | CUTANEOUS | 3 refills | Status: DC
  Filled 2024-08-08: qty 60, 30d supply, fill #0

## 2024-08-08 NOTE — Patient Instructions (Addendum)
 Date: Thu Aug 08 2024  Hello Marcus Nichols,  Thank you for visiting today. Here is a summary of the key instructions:  - Medications:   - Apply clobetasol  cream twice a day for 2 weeks   - Apply tacrolimus  cream twice a day for 2 weeks, alternating with clobetasol    - Continue taking Dovato  as prescribed for HIV  - Treatment Areas:   - Complete the prescribed cream treatments for 8 weeks   - After 8 weeks, you may be eligible for Dupixent injections  - Follow-up:   - Return to the clinic in 8 weeks  - Other Instructions:   - Your itching may be related to your HIV viral load   - Keep taking your HIV medication as prescribed   - Continue to follow up with your HIV doctor to check your viral load  Please reach out if you have any questions or concerns.  Warm regards,  Dr. Delon Lenis [facility]at[facility]   Important Information  Due to recent changes in healthcare laws, you may see results of your pathology and/or laboratory studies on MyChart before the doctors have had a chance to review them. We understand that in some cases there may be results that are confusing or concerning to you. Please understand that not all results are received at the same time and often the doctors may need to interpret multiple results in order to provide you with the best plan of care or course of treatment. Therefore, we ask that you please give us  2 business days to thoroughly review all your results before contacting the office for clarification. Should we see a critical lab result, you will be contacted sooner.   If You Need Anything After Your Visit  If you have any questions or concerns for your doctor, please call our main line at [phone] If no one answers, please leave a voicemail as directed and we will return your call as soon as possible. Messages left after 4 pm will be answered the following business day.   You may also send us  a message via MyChart. We typically respond to MyChart messages  within 1-2 business days.  For prescription refills, please ask your pharmacy to contact our office. Our fax number is [phone].  If you have an urgent issue when the clinic is closed that cannot wait until the next business day, you can page your doctor at the number below.    Please note that while we do our best to be available for urgent issues outside of office hours, we are not available 24/7.   If you have an urgent issue and are unable to reach us , you may choose to seek medical care at your doctor's office, retail clinic, urgent care center, or emergency room.  If you have a medical emergency, please immediately call 911 or go to the emergency department. In the event of inclement weather, please call our main line at [phone] for an update on the status of any delays or closures.  Dermatology Medication Tips: Please keep the boxes that topical medications come in in order to help keep track of the instructions about where and how to use these. Pharmacies typically print the medication instructions only on the boxes and not directly on the medication tubes.   If your medication is too expensive, please contact our office at [phone] or send us  a message through MyChart.   We are unable to tell what your co-pay for medications will be in advance as this  is different depending on your insurance coverage. However, we may be able to find a substitute medication at lower cost or fill out paperwork to get insurance to cover a needed medication.   If a prior authorization is required to get your medication covered by your insurance company, please allow us  1-2 business days to complete this process.  Drug prices often vary depending on where the prescription is filled and some pharmacies may offer cheaper prices.  The website www.goodrx.com contains coupons for medications through different pharmacies. The prices here do not account for what the cost may be with help from  insurance (it may be cheaper with your insurance), but the website can give you the price if you did not use any insurance.  - You can print the associated coupon and take it with your prescription to the pharmacy.  - You may also stop by our office during regular business hours and pick up a GoodRx coupon card.  - If you need your prescription sent electronically to a different pharmacy, notify our office through [facility] or by phone at [phone]

## 2024-08-08 NOTE — Progress Notes (Signed)
   New Patient Visit   Subjective  Izsak Nichols is a 58 y.o. male who presents for the following: dry scaly skin  Patient states he  has dry scaly skin located at the arms that he  would like to have examined. Patient reports the areas have been there for 1 year. He reports the areas are bothersome, patient reports he is very itchy all the time. Patient rates irritation 10 out of 10. He states that the areas have spread he states it sometimes gets on his leg. Patient reports he  has previously been treated for these areas patient reports he was treated with triamcinolone  0.025%. Patient reports that he washes with dove or a tumeric bar soap and he does not moisturize with anything on the regular. Patient denies Hx of bx. Patient denies family history of skin cancer(s).  The following portions of the chart were reviewed this encounter and updated as appropriate: medications, allergies, medical history  Review of Systems:  No other skin or systemic complaints except as noted in HPI or Assessment and Plan.  Objective  Well appearing patient in no apparent distress; mood and affect are within normal limits.  A full examination was performed including scalp, head, eyes, ears, nose, lips, neck, chest, axillae, abdomen, back, buttocks, bilateral upper extremities, bilateral lower extremities, hands, feet, fingers, toes, fingernails, and toenails. All findings within normal limits unless otherwise noted below.   Relevant exam findings are noted in the Assessment and Plan.               Assessment & Plan   1. Severe Eczema with Pruritus - Assessment: Patient reports severe, persistent itching with associated swelling and skin lesions. Physical exam reveals numerous papules, plaques, and excoriations on wrists, forearms, lower extremities, back, and abdomen. Differential diagnosis included scabies but scraping test was negative for parasites, ova, or scabala. Previous treatments with  triamcinolone  and prednisone  shots provided temporary relief. Diagnosis of severe eczema with pruritus is made based on clinical presentation and negative scabies test. - Plan:    Prescribe clobetasol  cream, apply twice daily for 2 weeks    Alternate with tacrolimus  cream, apply twice daily for 2 weeks    If symptoms persist, consider initiating Dupixent injections pending insurance approval    Continue current HIV management with Dovato     Emphasize importance of medication adherence for HIV viral load control    Recommend regular follow-up with HIV specialist to monitor viral load and CD4 count  Follow-up in 8 weeks.  ATOPIC DERMATITIS, UNSPECIFIED TYPE   Related Medications tacrolimus  (PROTOPIC ) 0.1 % ointment Apply topically daily. When taking a break for two weeks from the clobetasol  apply two times daily for two weeks then stop and alternate with clobetasol  clobetasol  ointment (TEMOVATE ) 0.05 % Apply 1 Application topically 2 (two) times daily. Apply to the body only two times daily for two weeks then stop  Return in about 8 weeks (around 10/03/2024) for Atopic Dermatitis follow up (DB at 0:15 per JD).  I, Doyce Pan, CMA, am acting as scribe for [employer], DO.   Documentation: I have reviewed the above documentation for accuracy and completeness, and I agree with the above.  Marcus Lenis, DO

## 2024-08-09 ENCOUNTER — Encounter: Payer: Self-pay | Admitting: Family

## 2024-08-09 ENCOUNTER — Other Ambulatory Visit: Payer: Self-pay

## 2024-08-09 ENCOUNTER — Telehealth: Payer: Self-pay

## 2024-08-09 ENCOUNTER — Ambulatory Visit (INDEPENDENT_AMBULATORY_CARE_PROVIDER_SITE_OTHER): Payer: MEDICAID | Admitting: Family

## 2024-08-09 ENCOUNTER — Other Ambulatory Visit (HOSPITAL_COMMUNITY): Payer: Self-pay

## 2024-08-09 VITALS — BP 154/90 | HR 80 | Temp 98.1°F | Ht 74.0 in | Wt 182.0 lb

## 2024-08-09 DIAGNOSIS — Z79899 Other long term (current) drug therapy: Secondary | ICD-10-CM

## 2024-08-09 DIAGNOSIS — Z21 Asymptomatic human immunodeficiency virus [HIV] infection status: Secondary | ICD-10-CM | POA: Diagnosis not present

## 2024-08-09 DIAGNOSIS — Z23 Encounter for immunization: Secondary | ICD-10-CM | POA: Diagnosis not present

## 2024-08-09 DIAGNOSIS — Z59819 Housing instability, housed unspecified: Secondary | ICD-10-CM | POA: Diagnosis not present

## 2024-08-09 DIAGNOSIS — Z0000 Encounter for general adult medical examination without abnormal findings: Secondary | ICD-10-CM

## 2024-08-09 DIAGNOSIS — R569 Unspecified convulsions: Secondary | ICD-10-CM | POA: Insufficient documentation

## 2024-08-09 MED ORDER — DOLUTEGRAVIR-LAMIVUDINE 50-300 MG PO TABS
1.0000 | ORAL_TABLET | Freq: Every day | ORAL | 4 refills | Status: DC
  Filled 2024-08-09 – 2024-09-06 (×2): qty 30, 30d supply, fill #0
  Filled 2024-10-02: qty 30, 30d supply, fill #1
  Filled 2024-11-12: qty 30, 30d supply, fill #2

## 2024-08-09 NOTE — Assessment & Plan Note (Signed)
 No evidence of chart of previous seizure and recent normal CT imaging in April 2025. Recommend follow up with Internal Medicine for further evaluation and referral to Neurology if appropriate.

## 2024-08-09 NOTE — Assessment & Plan Note (Signed)
 Referred to [facility] and awaiting further information regarding housing assistance. [facility] number provided in AVS.

## 2024-08-09 NOTE — Telephone Encounter (Signed)
 Medication was picked up at [facility] on 08/09/24
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
## 2024-08-09 NOTE — Assessment & Plan Note (Signed)
 Discussed importance of safe sexual practice and condom use. Condoms and site specific STD testing offered.  Vaccinations reviewed and influenza vaccination updated today.  Due for colon cancer screening and previous referral declined. Will check into options for colonoscopy. Due for routine dental care.

## 2024-08-09 NOTE — Patient Instructions (Addendum)
 Nice to see you.  We will check your lab work today.  Call [facility] for housing assistance and ask for [place] [phone]  Continue to take your medication daily as prescribed.  Refills have been sent to the pharmacy.  Plan for follow up in 4 months or sooner if needed with lab work on the same day.   Have a great day and stay safe!

## 2024-08-09 NOTE — Assessment & Plan Note (Signed)
 Marcus Nichols should have improved viral control with improved adherence and continued tolerance to Dovato . Discussed importance of taking medication daily as prescribed. Reviewed previous lab work and discussed plan of care and U equals U. Social determinants of health reviewed. Check lab work. Continue current dose of Dovato . Plan for follow up in 4 months or sooner if needed with lab work on the same day.

## 2024-08-12 ENCOUNTER — Other Ambulatory Visit: Payer: Self-pay

## 2024-08-13 ENCOUNTER — Ambulatory Visit: Payer: Self-pay | Admitting: Family

## 2024-08-13 ENCOUNTER — Other Ambulatory Visit: Payer: Self-pay

## 2024-08-13 LAB — LIPID PANEL
Cholesterol: 148 mg/dL (ref <200)
HDL: 64 mg/dL (ref >=40)
LDL Cholesterol (Calc): 65 mg/dL
Non-HDL Cholesterol (Calc): 84 mg/dL (ref <130)
Total CHOL/HDL Ratio: 2.3 (calc) (ref <5.0)
Triglycerides: 106 mg/dL (ref <150)

## 2024-08-13 LAB — COMPREHENSIVE METABOLIC PANEL WITH GFR
AG Ratio: 1.4 (calc) (ref 1.0–2.5)
ALT: 24 U/L (ref 9–46)
AST: 26 U/L (ref 10–35)
Albumin: 4.5 g/dL (ref 3.6–5.1)
Alkaline phosphatase (APISO): 67 U/L (ref 35–144)
BUN: 13 mg/dL (ref 7–25)
CO2: 30 mmol/L (ref 20–32)
Calcium: 9.5 mg/dL (ref 8.6–10.3)
Chloride: 103 mmol/L (ref 98–110)
Creat: 0.99 mg/dL (ref 0.70–1.30)
Globulin: 3.2 g/dL (ref 1.9–3.7)
Glucose, Bld: 89 mg/dL (ref 65–99)
Potassium: 4.5 mmol/L (ref 3.5–5.3)
Sodium: 139 mmol/L (ref 135–146)
Total Bilirubin: 0.9 mg/dL (ref 0.2–1.2)
Total Protein: 7.7 g/dL (ref 6.1–8.1)
eGFR: 88 mL/min/1.73m2 (ref >=60)

## 2024-08-13 LAB — T-HELPER CELLS (CD4) COUNT (NOT AT ARMC)
Absolute CD4: 357 {cells}/uL — ABNORMAL LOW (ref 490–1740)
CD4 T Helper %: 32 % (ref 30–61)
Total lymphocyte count: 1105 {cells}/uL (ref 850–3900)

## 2024-08-13 LAB — HIV-1 RNA QUANT-NO REFLEX-BLD
HIV 1 RNA Quant: 1020 {copies}/mL — ABNORMAL HIGH
HIV-1 RNA Quant, Log: 3.01 {Log_copies}/mL — ABNORMAL HIGH

## 2024-08-13 NOTE — Telephone Encounter (Signed)
 Spoke with patient regarding results. No questions.  Marcus Nichols CHRISTELLA Code, RMA

## 2024-08-13 NOTE — Telephone Encounter (Signed)
-----   Message from Cordella July sent at 08/13/2024  3:24 PM EDT ----- Please inform Marcus Nichols that his viral load was detectable but significantly improved now down to 1,020 from 42,600. CD4 count is 357. Kidney function, liver function and electrolytes within normal  ranges. Thanks! ----- Message ----- From: Rebecka Hose Lab Results In Sent: 08/09/2024  10:59 PM EDT To: Cordella JONETTA July, FNP

## 2024-09-06 ENCOUNTER — Other Ambulatory Visit: Payer: Self-pay

## 2024-09-06 ENCOUNTER — Other Ambulatory Visit (HOSPITAL_COMMUNITY): Payer: Self-pay

## 2024-09-06 NOTE — Progress Notes (Signed)
 Specialty Pharmacy Refill Coordination Note  Marcus Nichols is a 58 y.o. male contacted today regarding refills of specialty medication(s) Dolutegravir -lamiVUDine  (DOVATO )   Patient requested Courier to Provider Office   Delivery date: 09/10/24   Verified address: [facility] [address] [name] [address]   Medication will be filled on 09/09/24.

## 2024-09-09 ENCOUNTER — Other Ambulatory Visit: Payer: Self-pay

## 2024-09-12 ENCOUNTER — Other Ambulatory Visit: Payer: Self-pay | Admitting: Pharmacist

## 2024-09-12 MED ORDER — DOVATO 50-300 MG PO TABS: 1.0000 | ORAL_TABLET | Freq: Every day | ORAL | Status: AC

## 2024-09-12 NOTE — Progress Notes (Signed)
 Medication Samples have been provided to the patient.  Drug name: Dovato         Strength: 50/300 mg         Qty: 14  Tablets (1 bottles) LOT: [id number]   Exp.Date: 3/27  Samples requested by Cathlyn July, NP.  Dosing instructions: Take one tablet by mouth once daily  The patient has been instructed regarding the correct time, dose, and frequency of taking this medication, including desired effects and most common side effects.   Alan Geralds, PharmD, CPP, BCIDP, AAHIVP Clinical Pharmacist Practitioner Infectious Diseases Clinical Pharmacist [facility]

## 2024-09-17 ENCOUNTER — Other Ambulatory Visit (HOSPITAL_COMMUNITY): Payer: Self-pay

## 2024-09-30 ENCOUNTER — Other Ambulatory Visit: Payer: Self-pay

## 2024-09-30 ENCOUNTER — Other Ambulatory Visit (HOSPITAL_COMMUNITY): Payer: Self-pay

## 2024-09-30 ENCOUNTER — Other Ambulatory Visit: Payer: Self-pay | Admitting: Family
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
## 2024-10-02 ENCOUNTER — Other Ambulatory Visit (HOSPITAL_COMMUNITY): Payer: Self-pay

## 2024-10-02 ENCOUNTER — Other Ambulatory Visit: Payer: Self-pay

## 2024-10-02 NOTE — Progress Notes (Signed)
 Specialty Pharmacy Refill Coordination Note  Marcus Nichols is a 58 y.o. male contacted today regarding refills of specialty medication(s) Dolutegravir -lamiVUDine  (DOVATO )   Patient requested Courier to Provider Office   Delivery date: 10/04/24   Verified address: [facility] [address] [place] [address]   Medication will be filled on: 10/03/24

## 2024-10-03 ENCOUNTER — Ambulatory Visit (INDEPENDENT_AMBULATORY_CARE_PROVIDER_SITE_OTHER): Payer: MEDICAID | Admitting: Dermatology

## 2024-10-03 ENCOUNTER — Encounter: Payer: Self-pay | Admitting: Dermatology

## 2024-10-03 ENCOUNTER — Other Ambulatory Visit: Payer: Self-pay

## 2024-10-03 VITALS — BP 144/80 | HR 72

## 2024-10-03 DIAGNOSIS — L209 Atopic dermatitis, unspecified: Secondary | ICD-10-CM

## 2024-10-03 DIAGNOSIS — L299 Pruritus, unspecified: Secondary | ICD-10-CM

## 2024-10-03 MED ORDER — PREDNISONE 10 MG PO TABS
ORAL_TABLET | ORAL | 0 refills | Status: AC
Start: 2024-10-03 — End: 2024-10-20
  Filled 2024-10-04: qty 40, 16d supply, fill #0

## 2024-10-03 MED ORDER — TACROLIMUS 0.1 % EX OINT
TOPICAL_OINTMENT | Freq: Every day | CUTANEOUS | 3 refills | Status: AC
  Filled 2024-10-04: qty 100, 14d supply, fill #0

## 2024-10-03 MED ORDER — CLOBETASOL PROPIONATE 0.05 % EX OINT
1.0000 | TOPICAL_OINTMENT | Freq: Two times a day (BID) | CUTANEOUS | 3 refills | Status: AC
  Filled 2024-10-04: qty 60, 30d supply, fill #0

## 2024-10-03 MED ORDER — NEMOLIZUMAB-ILTO 30 MG ~~LOC~~ AUIJ: 30.0000 mg | AUTO-INJECTOR | Freq: Once | SUBCUTANEOUS | Status: AC

## 2024-10-03 NOTE — Patient Instructions (Addendum)
 VISIT SUMMARY:  Today, we discussed your ongoing issues with severe itching and skin plaques due to eczema. Your condition has slightly improved since your last visit, but you continue to experience severe itching, especially on your shoulder. We reviewed your current treatments and made some adjustments to help manage your symptoms better.  YOUR PLAN:  -ATOPIC DERMATITIS WITH SEVERE PRURITUS:  Atopic dermatitis is a chronic skin condition that causes severe itching and inflammation. Your itching is rated as very severe and is worsened by sweating.  We have submitted paperwork for [facility], an injectable treatment that can help reduce itching without affecting your immune system. While we wait for insurance approval, you will start a prednisone  taper to help manage your symptoms.   Take 4 tablets of prednisone  (40 mg) in the morning for 4 days, then reduce to 3 tablets for 4 days, then 2 tablets for 4 days, and finally 1 tablet for 4 days. Continue using clobetasol  cream twice daily for two weeks, then switch to tacrolimus  for the next two weeks.   Refills for your topical creams have been provided.   We will schedule a follow-up appointment in six months, depending on the approval of Nemluvio. Once approved, we will provide training on how to administer the Nemluvio injections.  INSTRUCTIONS:  Please follow the prednisone  taper schedule as prescribed: 4 tablets in the morning for 4 days, then 3 tablets for 4 days, then 2 tablets for 4 days, and finally 1 tablet for 4 days. Continue using clobetasol  cream twice daily for two weeks, then switch to tacrolimus  for the next two weeks. We will contact you once Namluvio is approved to schedule training for the injections. Your follow-up appointment is scheduled for six months from now, contingent on the approval of Namluvio.   Important Information  Due to recent changes in healthcare laws, you may see results of your pathology and/or laboratory  studies on MyChart before the doctors have had a chance to review them. We understand that in some cases there may be results that are confusing or concerning to you. Please understand that not all results are received at the same time and often the doctors may need to interpret multiple results in order to provide you with the best plan of care or course of treatment. Therefore, we ask that you please give us  2 business days to thoroughly review all your results before contacting the office for clarification. Should we see a critical lab result, you will be contacted sooner.   If You Need Anything After Your Visit  If you have any questions or concerns for your doctor, please call our main line at [phone] If no one answers, please leave a voicemail as directed and we will return your call as soon as possible. Messages left after 4 pm will be answered the following business day.   You may also send us  a message via MyChart. We typically respond to MyChart messages within 1-2 business days.  For prescription refills, please ask your pharmacy to contact our office. Our fax number is [phone].  If you have an urgent issue when the clinic is closed that cannot wait until the next business day, you can page your doctor at the number below.    Please note that while we do our best to be available for urgent issues outside of office hours, we are not available 24/7.   If you have an urgent issue and are unable to reach us , you may choose to seek  medical care at your doctor's office, retail clinic, urgent care center, or emergency room.  If you have a medical emergency, please immediately call 911 or go to the emergency department. In the event of inclement weather, please call our main line at [phone] for an update on the status of any delays or closures.  Dermatology Medication Tips: Please keep the boxes that topical medications come in in order to help keep track of the instructions  about where and how to use these. Pharmacies typically print the medication instructions only on the boxes and not directly on the medication tubes.   If your medication is too expensive, please contact our office at [phone] or send us  a message through MyChart.   We are unable to tell what your co-pay for medications will be in advance as this is different depending on your insurance coverage. However, we may be able to find a substitute medication at lower cost or fill out paperwork to get insurance to cover a needed medication.   If a prior authorization is required to get your medication covered by your insurance company, please allow us  1-2 business days to complete this process.  Drug prices often vary depending on where the prescription is filled and some pharmacies may offer cheaper prices.  The website www.goodrx.com contains coupons for medications through different pharmacies. The prices here do not account for what the cost may be with help from insurance (it may be cheaper with your insurance), but the website can give you the price if you did not use any insurance.  - You can print the associated coupon and take it with your prescription to the pharmacy.  - You may also stop by our office during regular business hours and pick up a GoodRx coupon card.  - If you need your prescription sent electronically to a different pharmacy, notify our office through [facility] or by phone at [phone]

## 2024-10-03 NOTE — Progress Notes (Signed)
 Follow-Up Visit  Patient (and/or pt guardian) consented to the use of AI-assisted tools for note generation.    Subjective  Marcus Nichols is a 58 y.o. male who presents for the following: Severe Eczema with Pruritus  Patient was last evaluated on 08/08/24.  At this visit patient was prescribed Clobetasol  0.05% cream to use twice daily for 2 weeks and to alternate with Tacrolimus  0.1% ointment  Patient reports symptoms are slightly better but has flare ups weekly  Patient rates itch 10/10 and sometimes bleeds from scratching Patient reports patches are consistent  Patient reports excessive dryness  Patient reports he is using a tumeric soap Patient reports he is using CeraVe Moisturizing cream Patient advised to continue with HIV management with Dovato  Recommended to follow up with HIV specialist for viral load and CD4 Count Patient reports he has seen a specialist  Patient denies medication changes.  Patient has tried and failed: Clobetasol  0.05% cream Tacrolimus  0.1% ointment Dexamethasone  injections 10 mg Benadryl  25 mg Methylprednsolone Acetate injection 80mg  Prednisone  20 mg Triamcinolone  0.25% ointment  The following portions of the chart were reviewed this encounter and updated as appropriate: medications, allergies, medical history  Review of Systems:  No other skin or systemic complaints except as noted in HPI or Assessment and Plan.  Objective  Well appearing patient in no apparent distress; mood and affect are within normal limits. A focused examination was performed of the following areas: Bilateral Arms  Relevant exam findings are noted in the Assessment and Plan.                  Assessment & Plan   ATOPIC DERMATITIS and SEVERE PRURITUS  Exam: Scaly pink papules coalescing to plaques 75% BSA,   IGA 3, 10/10 itch  Flared   Chronic atopic dermatitis with severe pruritus, primarily affecting the arms, chest, and back. Pruritus rated as  10/10, exacerbated by sweating. Current treatment with clobetasol  is ineffective for pruritus but provides some relief for pain. Condition has improved slightly since September but remains severe. Stress and medication changes may contribute to exacerbation. Nemluvio, an injectable treatment, is considered due to its efficacy in reducing pruritus without immunosuppression, making it suitable for patients on multiple medications. Insurance approval is pending, and the treatment involves monthly self-injections. Prednisone  taper is provided for short-term relief, pt aware of and acknowledging the risk of immunosuppression.  He will continue to wear mask in public  - Submitted rx/ paperwork for [facility] approval. - Prescribed prednisone  40 mg: take 4 tablets in the morning for 4 days, then 3 tablets for 4 days, then 2 tablets for 4 days, then 1 tablet for 4 days. - Continue clobetasol  twice daily for two weeks, then switch to tacrolimus  for two weeks. - Provided refills for topical creams. - Will schedule follow-up appointment in six months, contingent on Nemluvio approval. - Will provide training for Nemluvio injection once approved.  Plan: Nemluvio  Initiation Indications:  Patient isn't a candidate for systemic therapy with methotrexate or cyclosporine. Patient has been unresponsive to aggressive topical therapy.  Failed Treatments: Topical Steroids and Topical Protopic  and oral prednisone  (see detailed list in HPI)   Specific Contraindications Cyclosporine is contraindicated because the patient will not be able to complete the necessary follow-up labs. Methotrexate is contraindicated because the patient will not be able to complete the necessary follow-up labs. Phototherapy is contraindicated because the patient lives too far from the treatment location.   Nemluvio Counseling: I discussed with the patient the  risks of Nemluvio including but not limited to eye infection and irritation, cold  sores, injection site reactions, worsening of asthma, allergic reactions and increased risk of parasitic infection. Live vaccines should be avoided while taking dupilumab.   Nemluvio Monitoring: There is no laboratory monitoring requirement with Nemluvio .    ATOPIC DERMATITIS, UNSPECIFIED TYPE   Related Medications tacrolimus  (PROTOPIC ) 0.1 % ointment Apply topically daily. When taking a break for two weeks from the clobetasol  apply two times daily for two weeks then stop and alternate with clobetasol  clobetasol  ointment (TEMOVATE ) 0.05 % Apply 1 Application topically 2 (two) times daily. Apply to the body only two times daily for two weeks then stop nemolizumab-ilto (NEMLUVIO) SQ injection 30 mg   Return in about 6 months (around 04/02/2025) for Eczema F/U.  I, Lyle Cords, as acting as a neurosurgeon for [employer], DO .   Documentation: I have reviewed the above documentation for accuracy and completeness, and I agree with the above.  Delon Lenis, DO

## 2024-10-04 ENCOUNTER — Other Ambulatory Visit (HOSPITAL_BASED_OUTPATIENT_CLINIC_OR_DEPARTMENT_OTHER): Payer: Self-pay

## 2024-10-04 ENCOUNTER — Other Ambulatory Visit (HOSPITAL_COMMUNITY): Payer: Self-pay

## 2024-10-04 ENCOUNTER — Other Ambulatory Visit: Payer: Self-pay

## 2024-10-07 ENCOUNTER — Telehealth: Payer: Self-pay

## 2024-10-07 ENCOUNTER — Other Ambulatory Visit: Payer: Self-pay

## 2024-10-07 NOTE — Telephone Encounter (Signed)
 [facility] Patient Advocate Encounter  Patient's medications Biktarvy  have been couriered to [facility] from [facility] and will be  picked up.  Arland Hutchinson, CPhT Specialty Pharmacy Patient [facility] for Infectious Disease Phone: [phone] Fax:  [phone]

## 2024-10-08 ENCOUNTER — Other Ambulatory Visit: Payer: Self-pay

## 2024-10-08 ENCOUNTER — Telehealth: Payer: Self-pay

## 2024-10-08 NOTE — Telephone Encounter (Signed)
 Medication was picked up at [facility] on 10/08/24 @ 9:07am

## 2024-10-23 ENCOUNTER — Other Ambulatory Visit: Payer: Self-pay

## 2024-11-12 ENCOUNTER — Other Ambulatory Visit: Payer: Self-pay

## 2024-11-12 NOTE — Progress Notes (Signed)
 Specialty Pharmacy Refill Coordination Note  Marcus Nichols is a 58 y.o. male contacted today regarding refills of specialty medication(s) Dolutegravir -lamiVUDine  (DOVATO )   Patient requested Courier to Provider Office   Delivery date: 11/14/24   Verified address: [facility] [address] [place] [address]   Medication will be filled on: 11/13/24

## 2024-11-13 ENCOUNTER — Other Ambulatory Visit: Payer: Self-pay

## 2024-11-14 ENCOUNTER — Telehealth: Payer: Self-pay

## 2024-11-14 NOTE — Telephone Encounter (Signed)
 [facility] Patient Advocate Encounter  Patient's medications DOVATO have been couriered to [facility] from [facility] pharmacy and will be picked up at  [facility].  Kae Heller, CPhT Specialty Pharmacy Patient [facility] Phone: [phone] Fax:  [phone]

## 2024-12-04 ENCOUNTER — Telehealth: Payer: Self-pay

## 2024-12-04 NOTE — Telephone Encounter (Signed)
 Medication was picked up at [facility] on 12/04/24 @ 8:53AM

## 2024-12-16 ENCOUNTER — Other Ambulatory Visit: Payer: Self-pay

## 2024-12-16 NOTE — Progress Notes (Signed)
 Specialty Pharmacy Ongoing Clinical Assessment Note  Marcus Nichols is a 59 y.o. male who is being followed by the specialty pharmacy service for RxSp HIV   Patient's specialty medication(s) reviewed today: Dolutegravir -lamiVUDine  (DOVATO )   Missed doses in the last 4 weeks: 0   Patient/Caregiver did not have any additional questions or concerns.   Therapeutic benefit summary: Patient is achieving benefit   Adverse events/side effects summary: No adverse events/side effects   Patient's therapy is appropriate to: Continue    Goals Addressed             This Visit's Progress    Achieve Undetectable HIV Viral Load < 20   Improving    Patient is not on track and improving. Patient will work on increased adherence.  The last viral load was 1,020 copies/mL from 08/09/24, patient reports increased compliance.     Maintain optimal adherence to therapy   Improving    Patient is not on track and improving. Patient will work on increased adherence         Follow up: 3 months  Silvano LOISE Blair Karel Santa

## 2024-12-20 ENCOUNTER — Other Ambulatory Visit: Payer: Self-pay

## 2024-12-20 ENCOUNTER — Other Ambulatory Visit (HOSPITAL_COMMUNITY): Payer: Self-pay

## 2024-12-20 ENCOUNTER — Ambulatory Visit: Payer: MEDICAID | Admitting: Family

## 2024-12-20 ENCOUNTER — Encounter: Payer: Self-pay | Admitting: Family

## 2024-12-20 VITALS — BP 172/80 | HR 64 | Temp 97.8°F | Resp 16 | Wt 196.0 lb

## 2024-12-20 DIAGNOSIS — Z21 Asymptomatic human immunodeficiency virus [HIV] infection status: Secondary | ICD-10-CM | POA: Diagnosis not present

## 2024-12-20 DIAGNOSIS — Z59819 Housing instability, housed unspecified: Secondary | ICD-10-CM | POA: Diagnosis not present

## 2024-12-20 DIAGNOSIS — G629 Polyneuropathy, unspecified: Secondary | ICD-10-CM

## 2024-12-20 DIAGNOSIS — Z79899 Other long term (current) drug therapy: Secondary | ICD-10-CM | POA: Diagnosis not present

## 2024-12-20 DIAGNOSIS — Z1211 Encounter for screening for malignant neoplasm of colon: Secondary | ICD-10-CM

## 2024-12-20 DIAGNOSIS — R569 Unspecified convulsions: Secondary | ICD-10-CM | POA: Diagnosis not present

## 2024-12-20 DIAGNOSIS — Z113 Encounter for screening for infections with a predominantly sexual mode of transmission: Secondary | ICD-10-CM

## 2024-12-20 DIAGNOSIS — Z0000 Encounter for general adult medical examination without abnormal findings: Secondary | ICD-10-CM

## 2024-12-20 MED ORDER — DOLUTEGRAVIR-LAMIVUDINE 50-300 MG PO TABS
1.0000 | ORAL_TABLET | Freq: Every day | ORAL | 4 refills | Status: AC
  Filled 2024-12-20: qty 30, 30d supply, fill #0

## 2024-12-20 NOTE — Assessment & Plan Note (Signed)
 Previously referred to [facility] to determine if he qualifies for housing assistance. Will reach out to [facility] to determine his assistance status. States he is working with a sports coach.

## 2024-12-20 NOTE — Assessment & Plan Note (Signed)
 Discussed importance of safe sexual practice and condom use. Condoms and site specific STD testing offered.  Vaccinations reviewed and recommend second dose of Shingrix when able.  Due for dental care and declined referral.  Working with case management.  Due for colonoscopy with referral placed to GI for completion.

## 2024-12-20 NOTE — Progress Notes (Signed)
 "   Brief Narrative   Patient ID: Marcus Nichols, male    DOB: 06-25-1966, 59 y.o.   MRN: [id number]  Marcus Nichols is a 59 y/o AA male diagnosed with HIV disease in 1999 with risk factor of drug use and heterosexual contact. No history of opportunistic infection. Genotype on 05/12/21 with Subtype B and no significant resistance patterns. Initial CD4 and viral load are unavailable.Previous ART experience with Sustiva, combivir, Genvoya and now Biktarvy .     Subjective:   Chief Complaint  Patient presents with   Follow-up    B20     HPI:  Marcus Nichols is a 59 y.o. male with HIV disease last seen on 08/09/2024 with improved adherence and good tolerance to Dovato .  Viral load improved from 42,600 down to 1020 with CD4 count of 357.  Kidney function, liver function, electrolytes within normal ranges.  Lipid profile triglycerides 106, LDL 65, and HDL 64.  Here today for follow-up.  Marcus Nichols has been doing okay since his last office visit and continues to take Dovato  as prescribed with no adverse side effects or problems obtaining medication from the pharmacy.  Covered by [employer].  Concerned that he has had additional seizures since the last time that he was seen.  Has not been seen by neurology or been evaluated for seizures from review of chart other than what was previously reported.  Also experiencing neuropathy in his bilateral legs with sharp pains on occasion.  He is continuing to work with case management.  Not currently housed with good access to food and transportation.  Continues to smoke marijuana and drink alcohol on occasion.  Healthcare maintenance reviewed.  Condoms and site-specific STD testing offered..  Denies fevers, chills, night sweats, headaches, changes in vision, neck pain/stiffness, nausea, diarrhea, vomiting, lesions or rashes.  Lab Results  Component Value Date   CD4TCELL 32 08/09/2024   CD4TABS 381 (L) 03/14/2024   Lab Results  Component Value Date   HIV1RNAQUANT 1,020  (H) 08/09/2024     Allergies[1]    Outpatient Medications Prior to Visit  Medication Sig Dispense Refill   dolutegravir -lamiVUDine  (DOVATO ) 50-300 MG tablet Take 1 tablet by mouth daily. 30 tablet 4   amLODipine  (NORVASC ) 10 MG tablet Take 1 tablet (10 mg total) by mouth daily. (Patient not taking: Reported on 12/20/2024) 90 tablet 1   aspirin  EC 81 MG tablet Take 1 tablet (81 mg total) by mouth daily. Swallow whole. (Patient not taking: Reported on 12/20/2024) 100 tablet 1   atorvastatin  (LIPITOR ) 20 MG tablet Take 1 tablet (20 mg total) by mouth daily. (Patient not taking: Reported on 12/20/2024) 90 tablet 0   Blood Pressure Monitoring (BLOOD PRESSURE CUFF) MISC Use as directed to monitor blood pressure (Patient not taking: Reported on 12/20/2024) 1 each 0   clobetasol  ointment (TEMOVATE ) 0.05 % Apply 1 Application topically 2 (two) times daily. Apply to the body only two times daily for two weeks then stop (Patient not taking: Reported on 12/20/2024) 60 g 3   permethrin  (ELIMITE ) 5 % cream Apply to affected area once from neck down.  Rinse off in shower after 12 hours. (Patient not taking: Reported on 12/20/2024) 60 g 0   predniSONE  (DELTASONE ) 20 MG tablet Take 1 tablet (20 mg total) by mouth daily with breakfast. (Patient not taking: Reported on 12/20/2024) 5 tablet 0   risperiDONE  (RISPERDAL ) 0.5 MG tablet Take 1 tablet (0.5 mg total) by mouth at bedtime. (Patient not taking: Reported on 12/20/2024) 30 tablet  1   tacrolimus  (PROTOPIC ) 0.1 % ointment Apply topically daily. When taking a break for two weeks from the clobetasol  apply two times daily for two weeks then stop and alternate with clobetasol  (Patient not taking: Reported on 12/20/2024) 100 g 3   triamcinolone  (KENALOG ) 0.025 % ointment Apply 1 Application topically 2 (two) times daily. DO NOT APPLY ONTO FACE (Patient not taking: Reported on 12/20/2024) 80 g 1   Vitamin D , Ergocalciferol , (DRISDOL ) 1.25 MG (50000 UNIT) CAPS capsule Take 1  capsule (50,000 Units total) by mouth every 7 (seven) days. (Patient not taking: Reported on 12/20/2024) 12 capsule 1   Facility-Administered Medications Prior to Visit  Medication Dose Route Frequency Provider Last Rate Last Admin   nemolizumab-ilto  (NEMLUVIO ) SQ injection 30 mg  30 mg Subcutaneous Once Alm Delon SAILOR, DO         Past Medical History:  Diagnosis Date   Asthma    Eczema    Enlarged prostate    HIV (human immunodeficiency virus infection) (HCC)    Humerus fracture    left    Hypertension    Inguinal hernia    Schizophrenia (HCC)    Sleep apnea    does not wear CPAP ( per spouse )     Past Surgical History:  Procedure Laterality Date   MULTIPLE TOOTH EXTRACTIONS     ORIF HUMERUS FRACTURE Left 03/07/2019   Procedure: OPEN REDUCTION INTERNAL FIXATION (ORIF) HUMERAL SHAFT FRACTURE;  Surgeon: Kendal Franky SQUIBB, MD;  Location: [facility] OR;  Service: Orthopedics;  Laterality: Left;        Review of Systems  Constitutional:  Negative for appetite change, chills, fatigue, fever and unexpected weight change.  Eyes:  Negative for visual disturbance.  Respiratory:  Negative for cough, chest tightness, shortness of breath and wheezing.   Cardiovascular:  Negative for chest pain and leg swelling.  Gastrointestinal:  Negative for abdominal pain, constipation, diarrhea, nausea and vomiting.  Genitourinary:  Negative for dysuria, flank pain, frequency, genital sores, hematuria and urgency.  Skin:  Negative for rash.  Allergic/Immunologic: Negative for immunocompromised state.  Neurological:  Negative for dizziness and headaches.     Objective:   BP (!) 172/80   Pulse 64   Temp 97.8 F (36.6 C) (Oral)   Resp 16   Wt 196 lb (88.9 kg)   SpO2 99%   BMI 25.16 kg/m  Nursing note and vital signs reviewed.  Physical Exam Constitutional:      General: He is not in acute distress.    Appearance: He is well-developed.  Eyes:     Conjunctiva/sclera: Conjunctivae normal.   Cardiovascular:     Rate and Rhythm: Normal rate and regular rhythm.     Heart sounds: Normal heart sounds. No murmur heard.    No friction rub. No gallop.  Pulmonary:     Effort: Pulmonary effort is normal. No respiratory distress.     Breath sounds: Normal breath sounds. No wheezing or rales.  Chest:     Chest wall: No tenderness.  Abdominal:     General: Bowel sounds are normal.     Palpations: Abdomen is soft.     Tenderness: There is no abdominal tenderness.  Musculoskeletal:     Cervical back: Neck supple.  Lymphadenopathy:     Cervical: No cervical adenopathy.  Skin:    General: Skin is warm and dry.     Findings: No rash.  Neurological:     Mental Status: He is alert and oriented  to person, place, and time.          08/09/2024   10:57 AM 03/14/2024    1:45 PM 01/18/2024    2:44 PM 01/11/2024    1:58 PM 12/05/2023    4:22 PM  Depression screen PHQ 2/9  Decreased Interest 0 0 0 3 1  Down, Depressed, Hopeless 0 1 3 2 2   PHQ - 2 Score 0 1 3 5 3   Altered sleeping 0 2  0 2  Tired, decreased energy 0 2  1 2   Change in appetite 0 0  2 2  Feeling bad or failure about yourself  0 0  1 1  Trouble concentrating 0 1  3 1   Moving slowly or fidgety/restless 0 1  1 0  Suicidal thoughts 0 0  0 0  PHQ-9 Score 0  7   13  11    Difficult doing work/chores Not difficult at all   Very difficult Somewhat difficult     Data saved with a previous flowsheet row definition        08/09/2024   10:57 AM 03/14/2024    1:46 PM 01/11/2024    1:58 PM 12/05/2023    4:22 PM  GAD 7 : Generalized Anxiety Score  Nervous, Anxious, on Edge 0  0  1  1   Control/stop worrying 0  0  2  1   Worry too much - different things 0  0  2  1   Trouble relaxing 0  0  2  1   Restless 0  0  2  2   Easily annoyed or irritable 0  0  2  1   Afraid - awful might happen 0  0  2  1   Total GAD 7 Score 0 0 13 8  Anxiety Difficulty Not difficult at all  Very difficult Somewhat difficult     Data saved with a  previous flowsheet row definition     The ASCVD Risk score (Arnett DK, et al., 2019) failed to calculate for the following reasons:   Risk score cannot be calculated because patient has a medical history suggesting prior/existing ASCVD   * - Cholesterol units were assumed      Assessment & Plan:    Patient Active Problem List   Diagnosis Date Noted   Seizure (HCC) 08/09/2024   Housing instability 01/18/2024   PTSD (post-traumatic stress disorder) 01/18/2024   History of cocaine use 01/18/2024   History of alcohol abuse 01/18/2024   Nicotine  use disorder 01/18/2024   MDD (major depressive disorder), recurrent episode, moderate (HCC) 01/18/2024   Prediabetes 12/15/2021   History of CVA with residual deficit 06/21/2021   Hyperglycemia 06/21/2021   Eczema 06/16/2021   Healthcare maintenance 05/12/2021   Hypertensive urgency 05/05/2021   Acute left-sided weakness 05/05/2021   Chest pain 05/05/2021   Alcohol abuse 05/05/2021   HIV (human immunodeficiency virus infection) (HCC) 05/05/2021   Tobacco use 05/05/2021   Polysubstance abuse (HCC) 05/05/2021   Paresthesia 05/05/2021   Schizophrenia spectrum disorder with psychotic disorder type not yet determined (HCC) 05/05/2021   Right-sided headache 05/05/2021   Cerebral thrombosis with cerebral infarction 05/05/2021   Essential hypertension 03/20/2019   Opiate overdose (HCC) 03/19/2019   Leukocytosis 03/19/2019   Hypokalemia 03/19/2019   Left arm pain 03/19/2019   Elevated blood pressure reading 03/19/2019   Comminuted fracture of humerus, left, open, initial encounter 02/28/2019     Problem List Items Addressed This Visit  Other   HIV (human immunodeficiency virus infection) (HCC) - Primary   Marcus Nichols appears to have adequately controlled virus with good adherence and tolerance to Dovato .  Discussed importance of taking medication daily to reduce risk of disease progression and complications in the future.  Reviewed  previous lab work and discussed plan of care and U equals U.  Social determinants of health reviewed with Food offered and declined today working with case management for housing.  Check blood work.  Continue current dose of Dovato .  Plan for follow-up in 4 months or sooner if needed with lab work on the same day.      Relevant Medications   dolutegravir -lamiVUDine  (DOVATO ) 50-300 MG tablet   Other Relevant Orders   HIV-1 RNA quant-no reflex-bld   T-helper cells (CD4) count (not at [facility])   Comprehensive metabolic panel with GFR   Healthcare maintenance   Discussed importance of safe sexual practice and condom use. Condoms and site specific STD testing offered.  Vaccinations reviewed and recommend second dose of Shingrix when able.  Due for dental care and declined referral.  Working with case management.  Due for colonoscopy with referral placed to GI for completion.       Housing instability   Previously referred to [facility] to determine if he qualifies for housing assistance. Will reach out to [facility] to determine his assistance status. States he is working with a sports coach.       Seizure (HCC)   Unclear if he is experiencing seizures with no documentation of previous seizures.  Will refer to neurology for further evaluation although differential certainly could include mental health or substance use.  I think it would still be beneficial to rule out actual seizures.      Relevant Orders   Ambulatory referral to Neurology   Other Visit Diagnoses       Neuropathy       Relevant Orders   HgB A1c   B12 and Folate Panel     Encounter for screening for malignant neoplasm of colon       Relevant Orders   Ambulatory referral to Gastroenterology     Screening for STDs (sexually transmitted diseases)       Relevant Orders   RPR W/RFLX TO RPR TITER, TREPONEMAL AB, SCREEN AND DIAGNOSIS        I am having Darina Yvone maintain his amLODipine , aspirin  EC, risperiDONE , Vitamin D   (Ergocalciferol ), Blood Pressure Cuff, predniSONE , triamcinolone , atorvastatin , permethrin , tacrolimus , clobetasol  ointment, and dolutegravir -lamiVUDine . We will continue to administer nemolizumab-ilto .   Meds ordered this encounter  Medications   dolutegravir -lamiVUDine  (DOVATO ) 50-300 MG tablet    Sig: Take 1 tablet by mouth daily.    Dispense:  30 tablet    Refill:  4    Please ship to [facility]    Supervising Provider:   LUIZ CHANNEL [phone]    Prescription Type::   Renewal     Follow-up: Return in about 4 months (around 04/19/2025). or sooner if needed.    Cathlyn July, MSN, FNP-C Nurse Practitioner [facility] for Infectious Disease [facility] Main number: [phone]       [1]  Allergies Allergen Reactions   Grass Pollen([id number]-[id number]-T-Swt Vern) Rash   "

## 2024-12-20 NOTE — Assessment & Plan Note (Signed)
 Unclear if he is experiencing seizures with no documentation of previous seizures.  Will refer to neurology for further evaluation although differential certainly could include mental health or substance use.  I think it would still be beneficial to rule out actual seizures.

## 2024-12-20 NOTE — Patient Instructions (Addendum)
 Nice to see you.  We will check your lab work today.  Continue to take your medication daily as prescribed.  Refills have been sent to the pharmacy.  Please be on the lookout for calls from Gastroenterology and Neurology.   Plan for follow up in 4 months or sooner if needed with lab work on the same day.  Have a great day and stay safe!

## 2024-12-20 NOTE — Assessment & Plan Note (Signed)
 Marcus Nichols appears to have adequately controlled virus with good adherence and tolerance to Dovato .  Discussed importance of taking medication daily to reduce risk of disease progression and complications in the future.  Reviewed previous lab work and discussed plan of care and U equals U.  Social determinants of health reviewed with Food offered and declined today working with case management for housing.  Check blood work.  Continue current dose of Dovato .  Plan for follow-up in 4 months or sooner if needed with lab work on the same day.

## 2024-12-20 NOTE — Progress Notes (Signed)
 Specialty Pharmacy Refill Coordination Note  Enos Muhl is a 59 y.o. male contacted today regarding refills of specialty medication(s) Dolutegravir -lamiVUDine  (DOVATO )   Patient requested Courier to Provider Office   Delivery date: 12/24/24   Verified address: [facility] [address] [place] [address]   Medication will be filled on: 12/23/24

## 2024-12-24 ENCOUNTER — Telehealth: Payer: Self-pay

## 2024-12-24 LAB — COMPREHENSIVE METABOLIC PANEL WITH GFR
AG Ratio: 1.4 (calc) (ref 1.0–2.5)
ALT: 21 U/L (ref 9–46)
AST: 25 U/L (ref 10–35)
Albumin: 4.2 g/dL (ref 3.6–5.1)
Alkaline phosphatase (APISO): 62 U/L (ref 35–144)
BUN: 15 mg/dL (ref 7–25)
CO2: 29 mmol/L (ref 20–32)
Calcium: 9.2 mg/dL (ref 8.6–10.3)
Chloride: 105 mmol/L (ref 98–110)
Creat: 0.86 mg/dL (ref 0.70–1.30)
Globulin: 2.9 g/dL (ref 1.9–3.7)
Glucose, Bld: 135 mg/dL — ABNORMAL HIGH (ref 65–99)
Potassium: 3.8 mmol/L (ref 3.5–5.3)
Sodium: 140 mmol/L (ref 135–146)
Total Bilirubin: 0.7 mg/dL (ref 0.2–1.2)
Total Protein: 7.1 g/dL (ref 6.1–8.1)
eGFR: 100 mL/min/{1.73_m2}

## 2024-12-24 LAB — HEMOGLOBIN A1C
Hgb A1c MFr Bld: 5.5 %
Mean Plasma Glucose: 111 mg/dL
eAG (mmol/L): 6.2 mmol/L

## 2024-12-24 LAB — B12 AND FOLATE PANEL
Folate: 15.7 ng/mL
Vitamin B-12: 333 pg/mL (ref 200–1100)

## 2024-12-24 LAB — T-HELPER CELLS (CD4) COUNT (NOT AT ARMC)
Absolute CD4: 391 {cells}/uL — ABNORMAL LOW (ref 490–1740)
CD4 T Helper %: 31 % (ref 30–61)
Total lymphocyte count: 1247 {cells}/uL (ref 850–3900)

## 2024-12-24 LAB — HIV-1 RNA QUANT-NO REFLEX-BLD
HIV 1 RNA Quant: 30000 {copies}/mL — ABNORMAL HIGH
HIV-1 RNA Quant, Log: 4.48 {Log_copies}/mL — ABNORMAL HIGH

## 2024-12-24 LAB — SYPHILIS: RPR W/REFLEX TO RPR TITER AND TREPONEMAL ANTIBODIES, TRADITIONAL SCREENING AND DIAGNOSIS ALGORITHM: RPR Ser Ql: NONREACTIVE

## 2024-12-24 NOTE — Telephone Encounter (Signed)
 Detectable Viral Load Intervention (DVL)  Most recent VL:  HIV 1 RNA Quant  Date Value Ref Range Status  12/20/2024 30,000 (H) NOT DETECTED copies/mL Final  08/09/2024 1,020 (H) NOT DETECTED copies/mL Final  03/14/2024 42,600 (H) NOT DETECTED copies/mL Final    Last Clinic Visit: 12/20/24  Current ART regimen: Dovato   Appointment status: patient has future appointment scheduled  Medication last dispensed (per chart review):   Medication Adherence   Not able to assess    Barriers to Care   Not able to assess    Interventions   Called patient to discuss medication adherence and possible barriers to care. Not working with [facility]- can offer if interested in case management to help with adherence.  Lorenda CHRISTELLA Code, RMA

## 2024-12-25 ENCOUNTER — Other Ambulatory Visit (HOSPITAL_COMMUNITY)
Admission: RE | Admit: 2024-12-25 | Discharge: 2024-12-25 | Disposition: A | Discharge status: Home / Self Care | Payer: MEDICAID | Source: Ambulatory Visit | Attending: Family | Admitting: Family

## 2024-12-25 ENCOUNTER — Other Ambulatory Visit: Payer: Self-pay

## 2024-12-25 ENCOUNTER — Other Ambulatory Visit: Payer: Self-pay | Admitting: Family

## 2024-12-25 ENCOUNTER — Other Ambulatory Visit: Payer: MEDICAID

## 2024-12-25 ENCOUNTER — Telehealth: Payer: Self-pay

## 2024-12-25 DIAGNOSIS — Z113 Encounter for screening for infections with a predominantly sexual mode of transmission: Secondary | ICD-10-CM | POA: Diagnosis present

## 2024-12-25 NOTE — Telephone Encounter (Signed)
 Pt called back and declines case management services. Verbalized that he is taking medication.  Lorenda CHRISTELLA Code, RMA

## 2024-12-25 NOTE — Telephone Encounter (Signed)
 [facility] Patient Advocate Encounter  Patient's medications DOVATO have been couriered to [facility] from [facility] pharmacy and will be picked up at  [facility].  Kae Heller, CPhT Specialty Pharmacy Patient [facility] Phone: [phone] Fax:  [phone]

## 2024-12-27 LAB — URINE CYTOLOGY ANCILLARY ONLY
Chlamydia: NEGATIVE
Comment: NEGATIVE
Comment: NORMAL
Neisseria Gonorrhea: NEGATIVE

## 2024-12-27 LAB — CYTOLOGY, (ORAL, ANAL, URETHRAL) ANCILLARY ONLY
Chlamydia: NEGATIVE
Comment: NEGATIVE
Comment: NORMAL
Neisseria Gonorrhea: NEGATIVE

## 2024-12-28 ENCOUNTER — Ambulatory Visit: Payer: Self-pay | Admitting: Family

## 2025-04-14 ENCOUNTER — Ambulatory Visit: Payer: MEDICAID | Admitting: Dermatology

## 2025-04-17 ENCOUNTER — Ambulatory Visit: Payer: Self-pay | Admitting: Family
